# Patient Record
Sex: Male | Born: 1940 | Race: White | Hispanic: No | Marital: Married | Smoking: Former smoker
Health system: Southern US, Community
[De-identification: ages and names within clinical notes are randomized; demographics above are authoritative.]

## PROBLEM LIST (undated history)

## (undated) DIAGNOSIS — I4891 Unspecified atrial fibrillation: Secondary | ICD-10-CM

## (undated) DIAGNOSIS — F0390 Unspecified dementia without behavioral disturbance: Secondary | ICD-10-CM

## (undated) DIAGNOSIS — I5020 Unspecified systolic (congestive) heart failure: Secondary | ICD-10-CM

## (undated) HISTORY — PX: FRACTURE SURGERY: SHX138

---

## 2004-08-30 ENCOUNTER — Emergency Department: Payer: Self-pay | Admitting: Emergency Medicine

## 2006-05-05 IMAGING — CT CT HEAD WITHOUT CONTRAST
1 series · 16 of 29 positions shown, 20 images · non-contrast
Comparison: none

REASON FOR EXAM: dizziness / vertigo / [HOSPITAL]
COMMENTS:

[Series 2: without · axial · non-contrast · 0.46mm/px · z∈[-197,-67]mm · 16 of 29 slices shown, 20 images]
[im 2/29  brain]
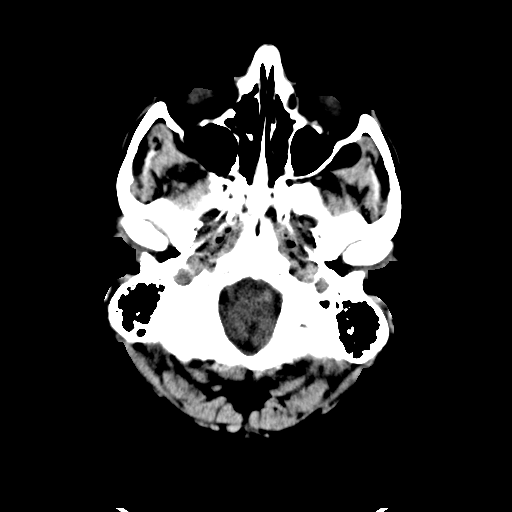
[im 2/29  bone]
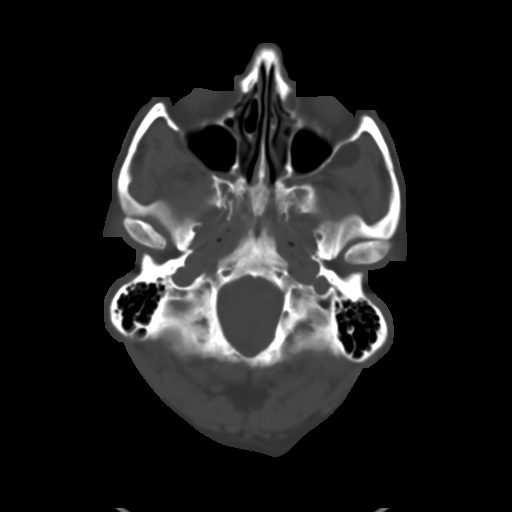
[im 4/29  brain]
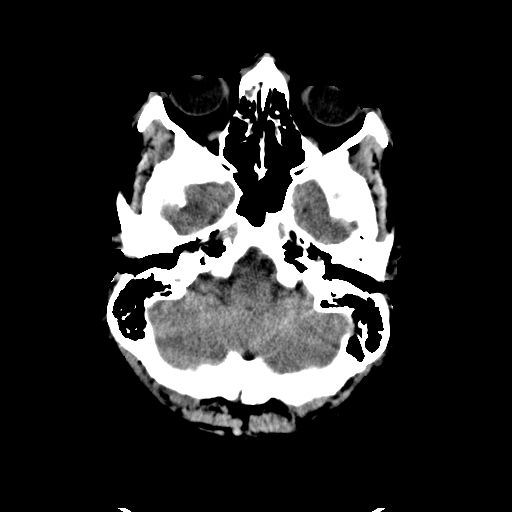
[im 6/29  brain]
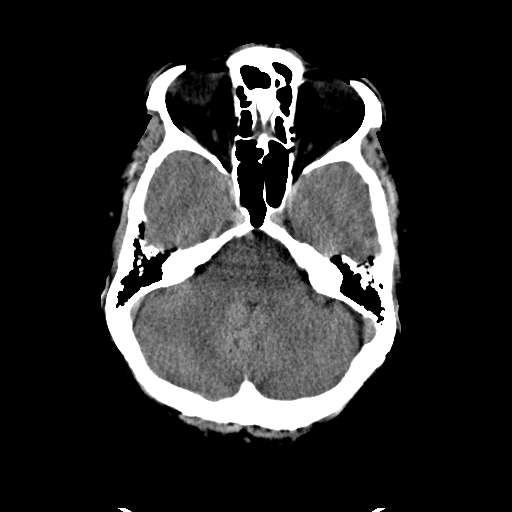
[im 7/29  brain]
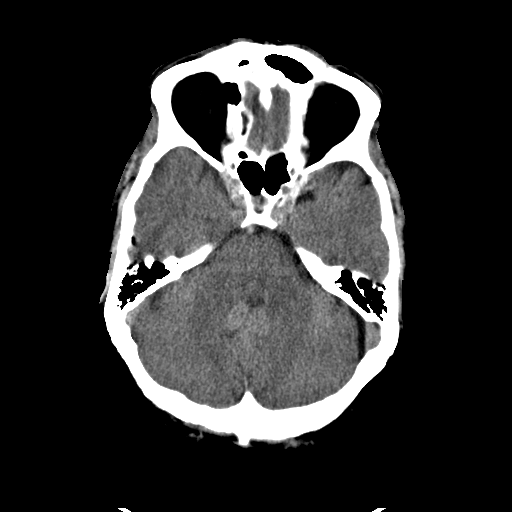
[im 9/29  brain]
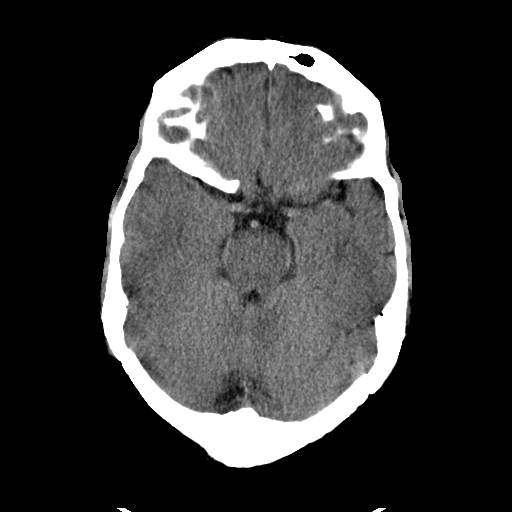
[im 9/29  bone]
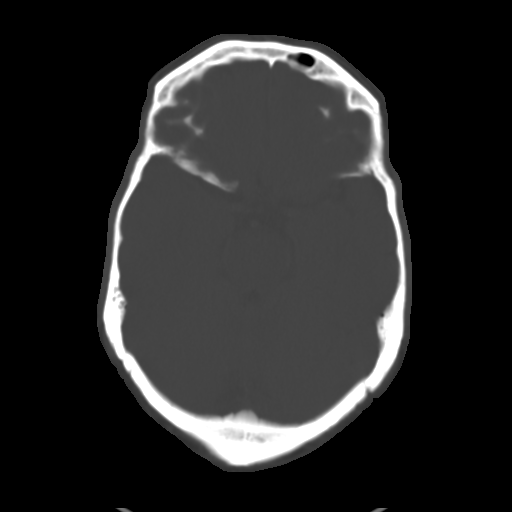
[im 11/29  brain]
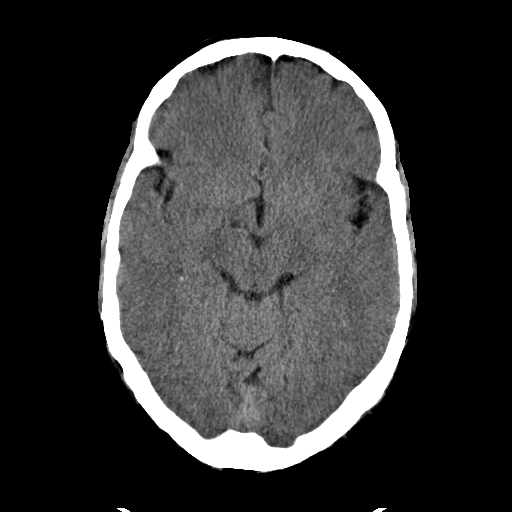
[im 12/29  brain]
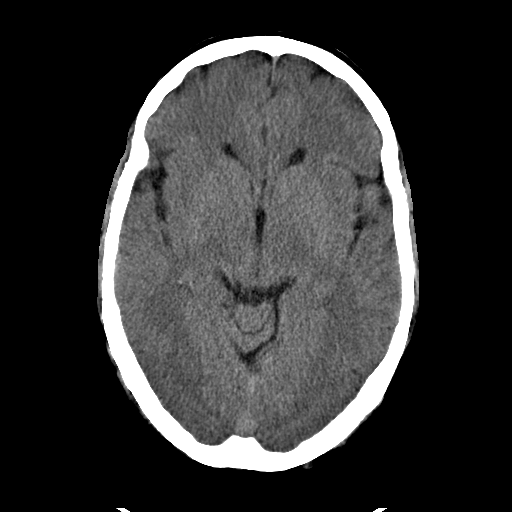
[im 14/29  brain]
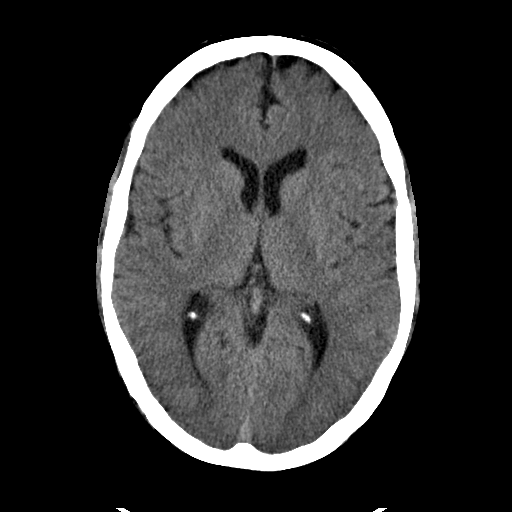
[im 16/29  brain]
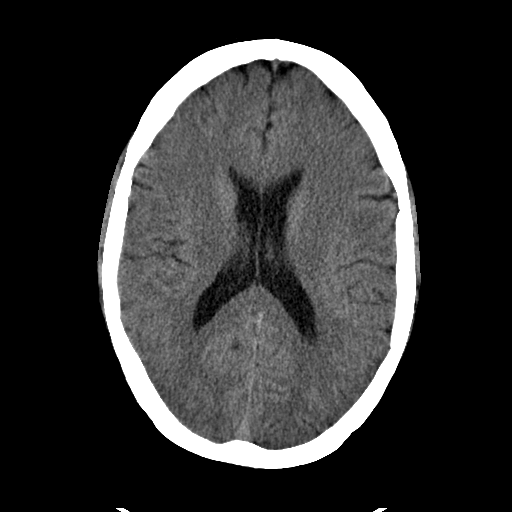
[im 16/29  bone]
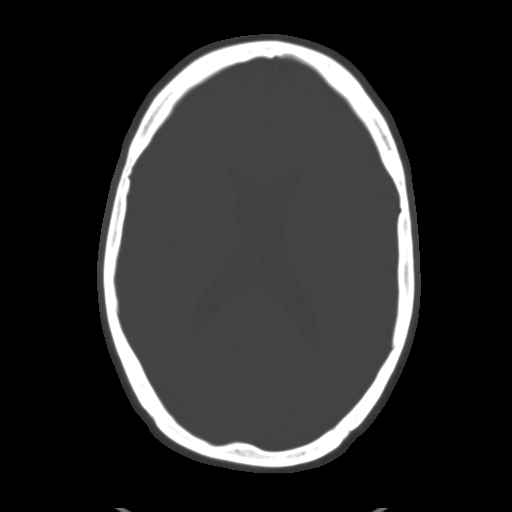
[im 18/29  brain]
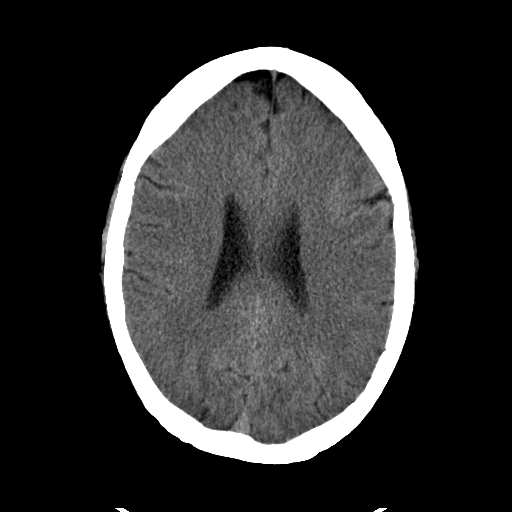
[im 19/29  brain]
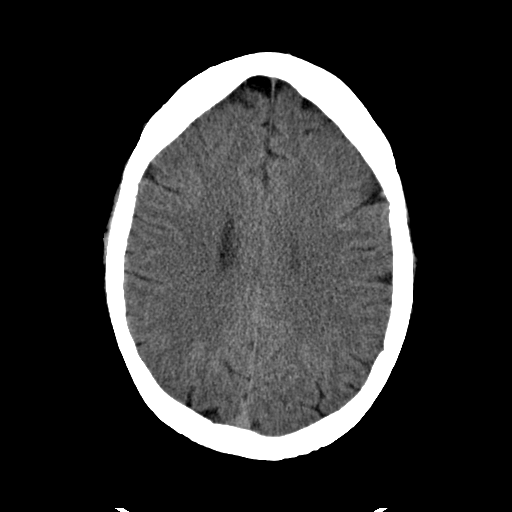
[im 21/29  brain]
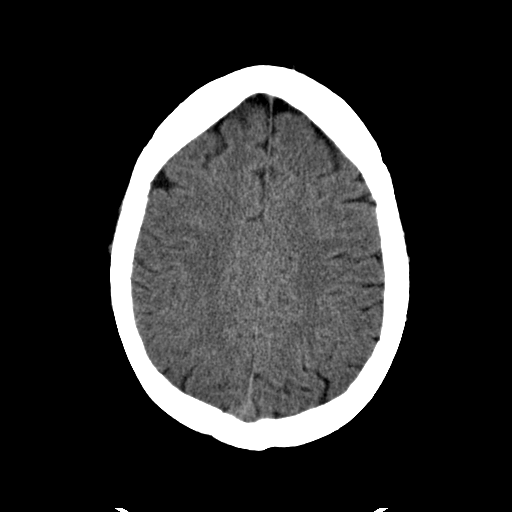
[im 23/29  brain]
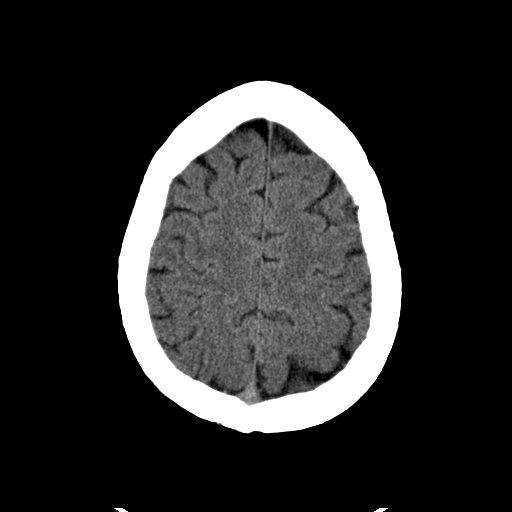
[im 23/29  bone]
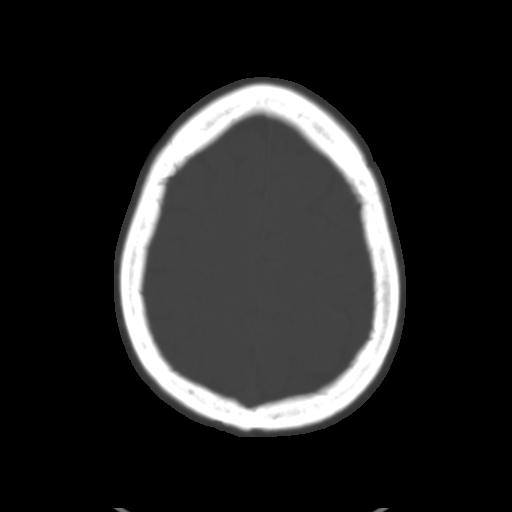
[im 24/29  brain]
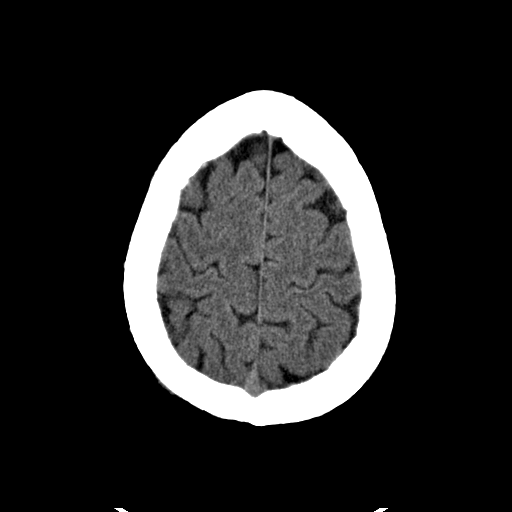
[im 26/29  brain]
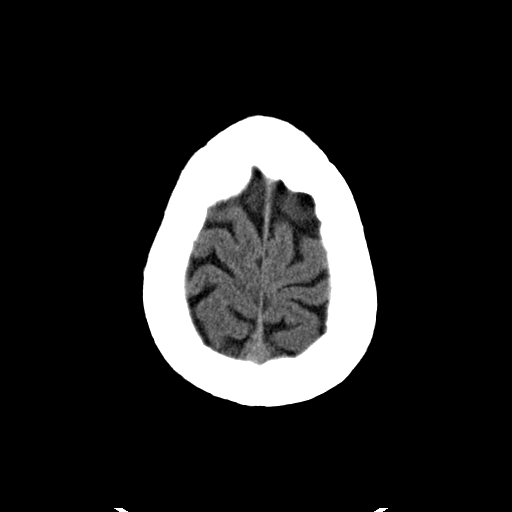
[im 28/29  brain]
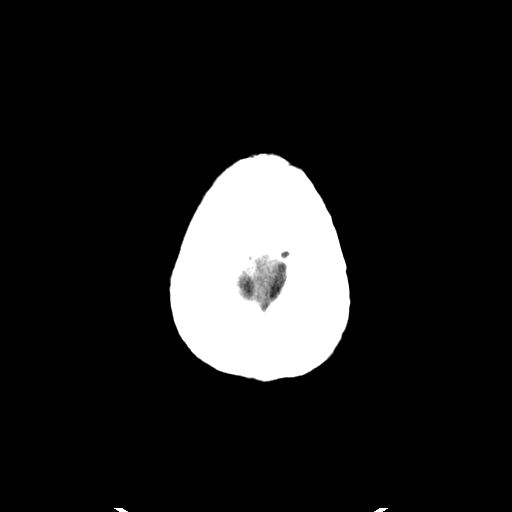

[16 of 29 positions shown; findings below may reference images not displayed]

PROCEDURE:     CT  - CT HEAD WITHOUT CONTRAST  - August 30, 2004  [DATE]

RESULT:     The patient is complaining of sudden onset of dizziness today.

The ventricles are normal in size and position.  I see no evidence of a mass
nor of mass effect.  There is no evidence of an intracranial hemorrhage.
The cerebellum and brainstem exhibit normal density.  At bone window
settings I do not see evidence of skull fracture or bony lesion and no
air/fluid levels are seen in the visualized portions of the paranasal
sinuses.
IMPRESSION: 1.     I see no acute intracranial abnormality.
2.     The findings were called to the Emergency Room at the conclusion of
the study.

## 2017-03-22 ENCOUNTER — Emergency Department
Admission: EM | Admit: 2017-03-22 | Discharge: 2017-03-22 | Disposition: A | Discharge status: Home / Self Care | Payer: Medicare Other | Attending: Emergency Medicine | Admitting: Emergency Medicine

## 2017-03-22 ENCOUNTER — Emergency Department: Payer: Medicare Other

## 2017-03-22 DIAGNOSIS — W01198A Fall on same level from slipping, tripping and stumbling with subsequent striking against other object, initial encounter: Secondary | ICD-10-CM | POA: Insufficient documentation

## 2017-03-22 DIAGNOSIS — R0789 Other chest pain: Secondary | ICD-10-CM

## 2017-03-22 DIAGNOSIS — Y999 Unspecified external cause status: Secondary | ICD-10-CM | POA: Diagnosis not present

## 2017-03-22 DIAGNOSIS — S299XXA Unspecified injury of thorax, initial encounter: Secondary | ICD-10-CM | POA: Diagnosis present

## 2017-03-22 DIAGNOSIS — Y939 Activity, unspecified: Secondary | ICD-10-CM | POA: Diagnosis not present

## 2017-03-22 DIAGNOSIS — Y929 Unspecified place or not applicable: Secondary | ICD-10-CM | POA: Insufficient documentation

## 2017-03-22 DIAGNOSIS — S20212A Contusion of left front wall of thorax, initial encounter: Secondary | ICD-10-CM | POA: Diagnosis not present

## 2017-03-22 MED ORDER — TRAMADOL HCL 50 MG PO TABS: 50.0000 mg | ORAL_TABLET | Freq: Two times a day (BID) | ORAL | 0 refills | Status: DC

## 2017-03-22 MED ORDER — TRAMADOL HCL 50 MG PO TABS
50.0000 mg | ORAL_TABLET | Freq: Once | ORAL | Status: AC
  Administered 2017-03-22: 50 mg via ORAL
  Filled 2017-03-22: qty 1

## 2017-03-22 NOTE — ED Triage Notes (Signed)
Pt states a week ago he fell and hit L rib cage. Has an abrasion noted to L ribs. Alert, oriented, ambulatory. Denies blood thinners. States pain with deep breath.

## 2017-03-22 NOTE — Discharge Instructions (Signed)
Your exam, EKG, and chest x-ray are essentially normal and negative for any acute fracture or dislocation. You have a contusion to the chest which may cause symptoms for a few days more. You should select and see a primary care provider for continued symptoms and routine medical care. Return to the ED as needed.

## 2017-03-22 NOTE — ED Provider Notes (Signed)
[facility] Emergency Department Provider Note ____________________________________________  Time seen: 1337  I have reviewed the triage vital signs and the nursing notes.  HISTORY  Chief Complaint  Fall (rib pain )  HPI Bob Ortiz is a 76 y.o. male presents to the ED for evaluation of continued left rib pain after he fell about a week prior. Patient describes falling and hitting his left rib cage on a small post sign in the ground. He presents today with abrasion and bruising noted to left ribs. He reports pain with a deep breaths that radiates around the chest wall. He denies any other injury at this time.He presents with his adult daughter, who is additionally concerned for a cardiac cause of his chest wall pain.   History reviewed. No pertinent past medical history.  There are no active problems to display for this patient.  History reviewed. No pertinent surgical history.  Prior to Admission medications   Medication Sig Start Date End Date Taking? Authorizing Provider  traMADol (ULTRAM) 50 MG tablet Take 1 tablet (50 mg total) by mouth 2 (two) times daily. 03/22/17   Keairra Bardon, Charlesetta Ivory, PA-C    Allergies Patient has no known allergies.  History reviewed. No pertinent family history.  Social History Social History  Substance Use Topics  . Smoking status: Never Smoker  . Smokeless tobacco: Not on file  . Alcohol use Yes   Review of Systems  Constitutional: Negative for fever. Cardiovascular: Negative for chest pain. Respiratory: Negative for shortness of breath. Gastrointestinal: Negative for abdominal pain, vomiting and diarrhea. Musculoskeletal: Negative for back pain. Left rib pain as above. Skin: Negative for rash. Neurological: Negative for headaches, focal weakness or numbness. ____________________________________________  PHYSICAL EXAM:  VITAL SIGNS: ED Triage Vitals [03/22/17 1230]  Enc Vitals Group     BP (!) 184/92      Pulse Rate 68     Resp 18     Temp 98.7 F (37.1 C)     Temp Source Oral     SpO2 99 %     Weight 194 lb (88 kg)     Height 5\' 9"  (1.753 m)     Head Circumference      Peak Flow      Pain Score 8     Pain Loc      Pain Edu?      Excl. in GC?     Constitutional: Alert and oriented. Well appearing and in no distress. Head: Normocephalic and atraumatic. Eyes: Conjunctivae are normal. Normal extraocular movements Cardiovascular: Normal rate, regular rhythm. Normal distal pulses. Respiratory: Normal respiratory effort. No wheezes/rales/rhonchi. Gastrointestinal: Soft and nontender. No distention. Musculoskeletal: Nontender with normal range of motion in all extremities.  Neurologic:  Normal gait without ataxia. Normal speech and language. No gross focal neurologic deficits are appreciated. Skin:  Skin is warm, dry and intact. No rash noted. ____________________________________________   RADIOLOGY  Left Rib Detail  IMPRESSION: No acute abnormality of the left ribs. Slight atelectasis at the left lung base.  I, [name], Charlesetta Ivory, personally viewed and evaluated these images (plain radiographs) as part of my medical decision making, as well as reviewing the written report by the radiologist.  <ECGINTERP>   EKG Report  EKG: there are no previous tracings available for comparison, sinus bradycardia, LBBB, 1st degree AV block. Rate 59 bpm ____________________________________________  PROCEDURES  Ultram 50 mg PO ____________________________________________  INITIAL IMPRESSION / ASSESSMENT AND PLAN / ED COURSE  Patient with  left rib pain and contusion following a mechanical fall. He is reassured by his negative chest x-ray and EKG. He is to select and follow-up with a local provider for further evaluation and management of his elevated blood pressures and benign lung findings. A prescription for Ultram is provided. Return precautions are reviewed.   ____________________________________________  FINAL CLINICAL IMPRESSION(S) / ED DIAGNOSES  Final diagnoses:  Chest wall contusion, left, initial encounter  Chest wall pain      Karmen Stabs, Charlesetta Ivory, PA-C 03/22/17 1746    Emily Filbert, MD 03/23/17 [phone]

## 2017-03-22 NOTE — ED Notes (Signed)
See triage note  States he fell last week  conts to have to left lateral rib area  Increased pain with inspiration

## 2018-08-02 ENCOUNTER — Other Ambulatory Visit: Payer: Self-pay

## 2018-08-02 ENCOUNTER — Ambulatory Visit
Admission: EM | Admit: 2018-08-02 | Discharge: 2018-08-02 | Disposition: A | Discharge status: Other Acute Inpatient Hospital | Payer: Medicare Other | Attending: Family Medicine | Admitting: Family Medicine

## 2018-08-02 ENCOUNTER — Encounter: Payer: Self-pay | Admitting: Emergency Medicine

## 2018-08-02 DIAGNOSIS — R42 Dizziness and giddiness: Secondary | ICD-10-CM

## 2018-08-02 DIAGNOSIS — R9431 Abnormal electrocardiogram [ECG] [EKG]: Secondary | ICD-10-CM

## 2018-08-02 DIAGNOSIS — R002 Palpitations: Secondary | ICD-10-CM | POA: Diagnosis present

## 2018-08-02 LAB — GLUCOSE, CAPILLARY: Glucose-Capillary: 101 mg/dL — ABNORMAL HIGH (ref 70–99)

## 2018-08-02 NOTE — ED Triage Notes (Signed)
Patient also states that he felt his pulse and it felt like it was skipping beats.

## 2018-08-02 NOTE — ED Triage Notes (Signed)
Patient denies nausea or chest pain. He states his pain is mostly in his shoulders. Patient denies any cardiac history.

## 2018-08-02 NOTE — Discharge Instructions (Signed)
Recommend patient go to the Emergency Department for further evaluation and management °

## 2018-08-02 NOTE — ED Triage Notes (Signed)
Patient in today c/o 3-4 day history of body aches and dizziness that started yesterday. Patient denies cough, congestion or fever. Patient has tried OTC Advil.

## 2018-08-06 DIAGNOSIS — E038 Other specified hypothyroidism: Secondary | ICD-10-CM | POA: Insufficient documentation

## 2018-11-25 IMAGING — CR DG RIBS W/ CHEST 3+V*L*
1 series · 4 of 4 positions shown · non-contrast
Comparison: None.

CLINICAL DATA: Left anterior rib pain since a fall last week.

EXAM:
LEFT RIBS AND CHEST - 3+ VIEW

[Series 1: dg ribs unilateral w/chest left · 0.14mm/px · 4 of 4 slices shown]
[im 1/4]
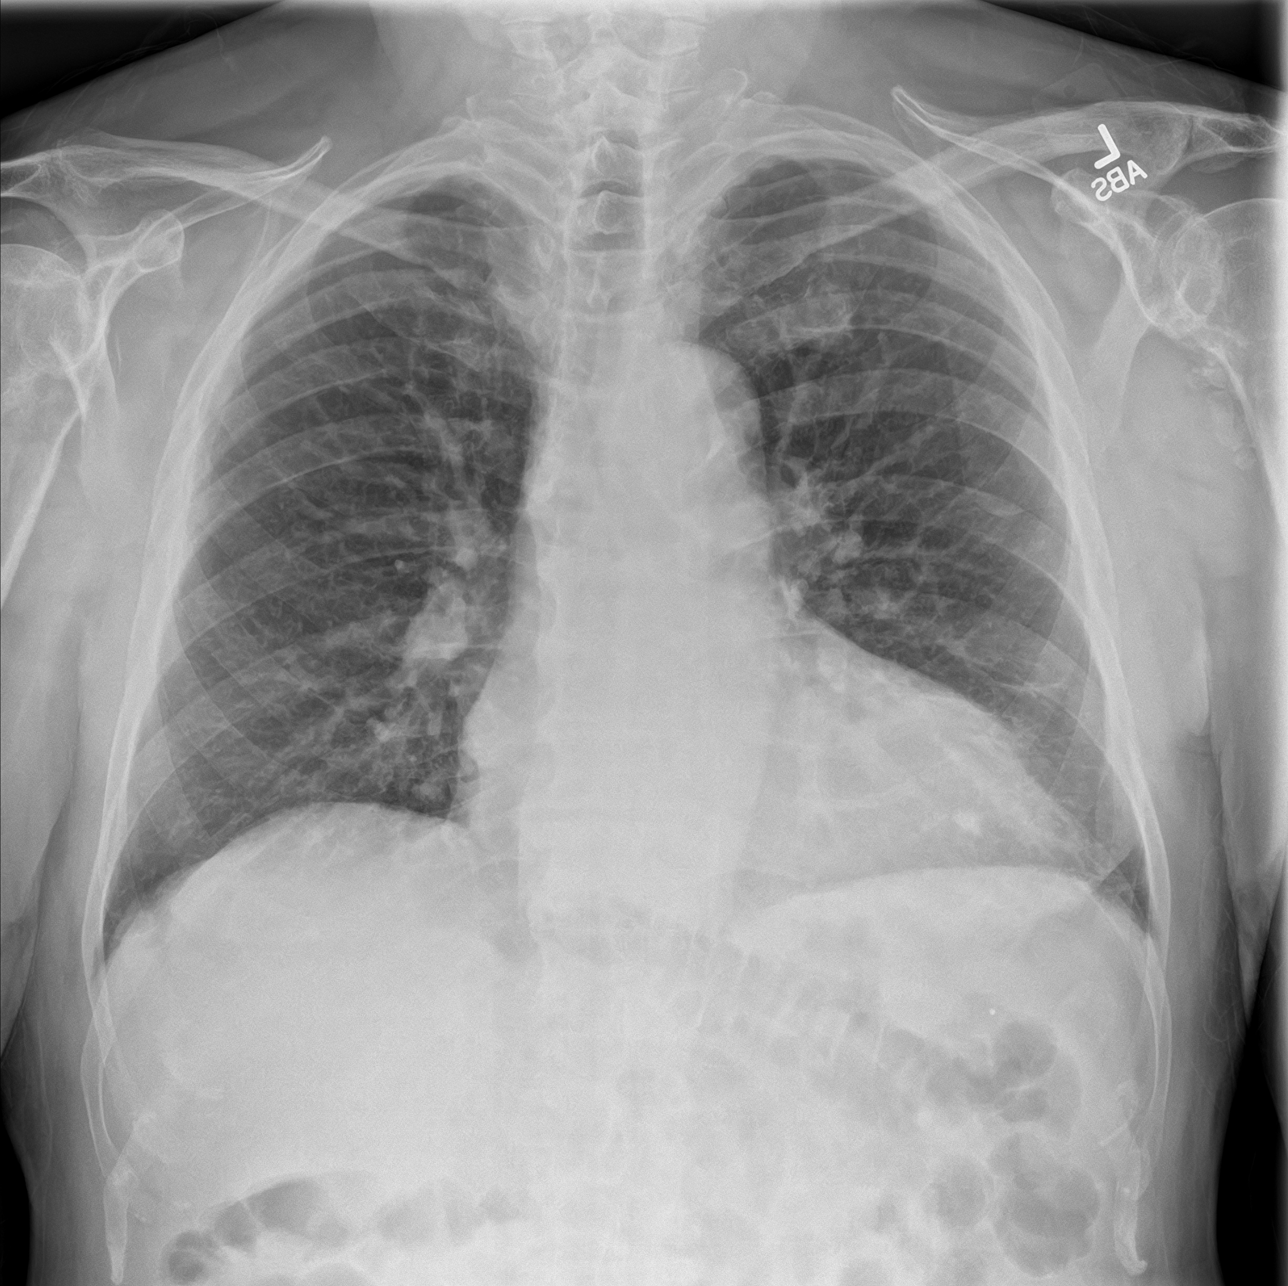
[im 2/4]
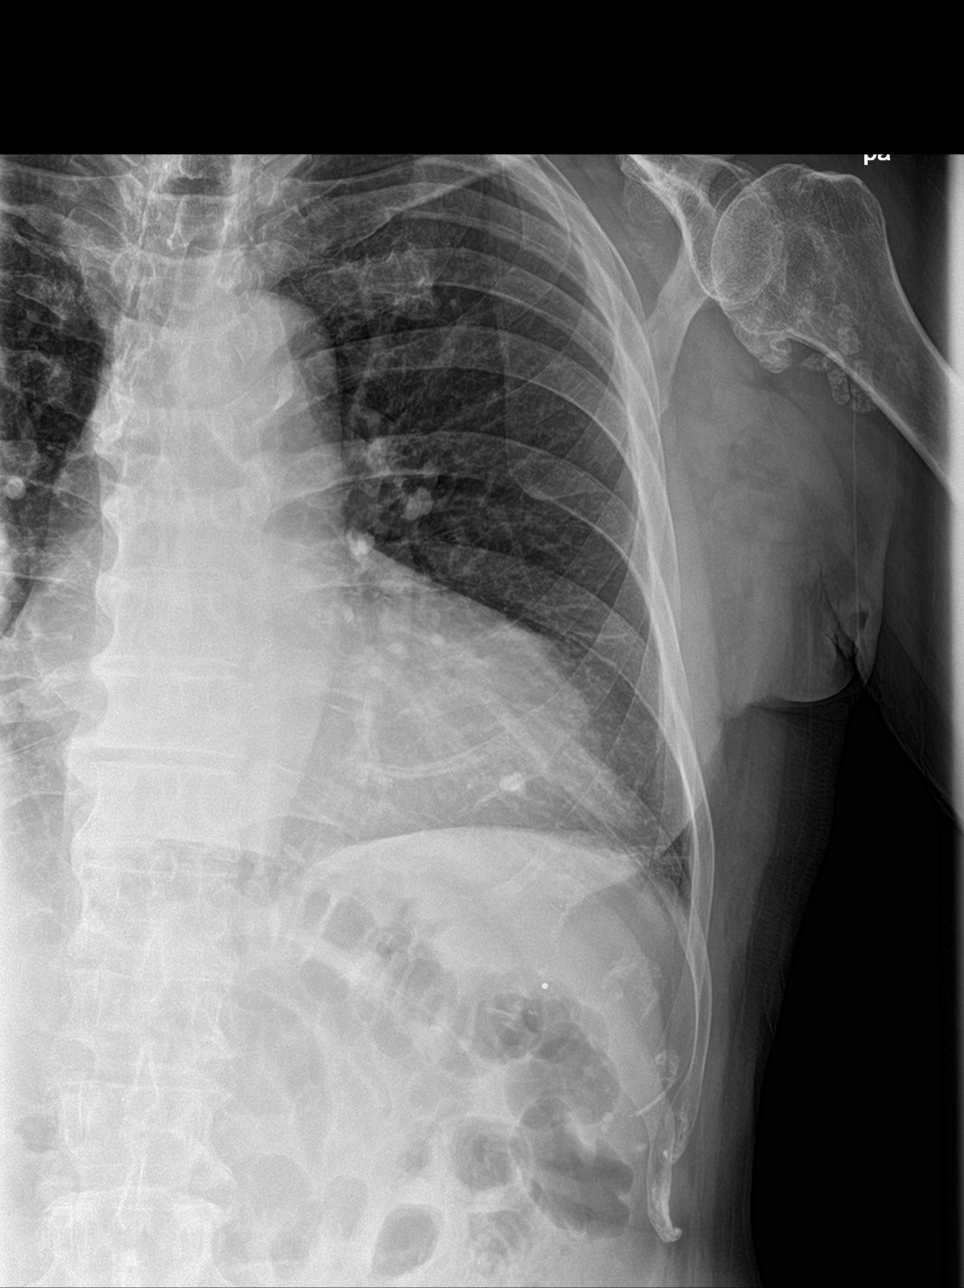
[im 3/4]
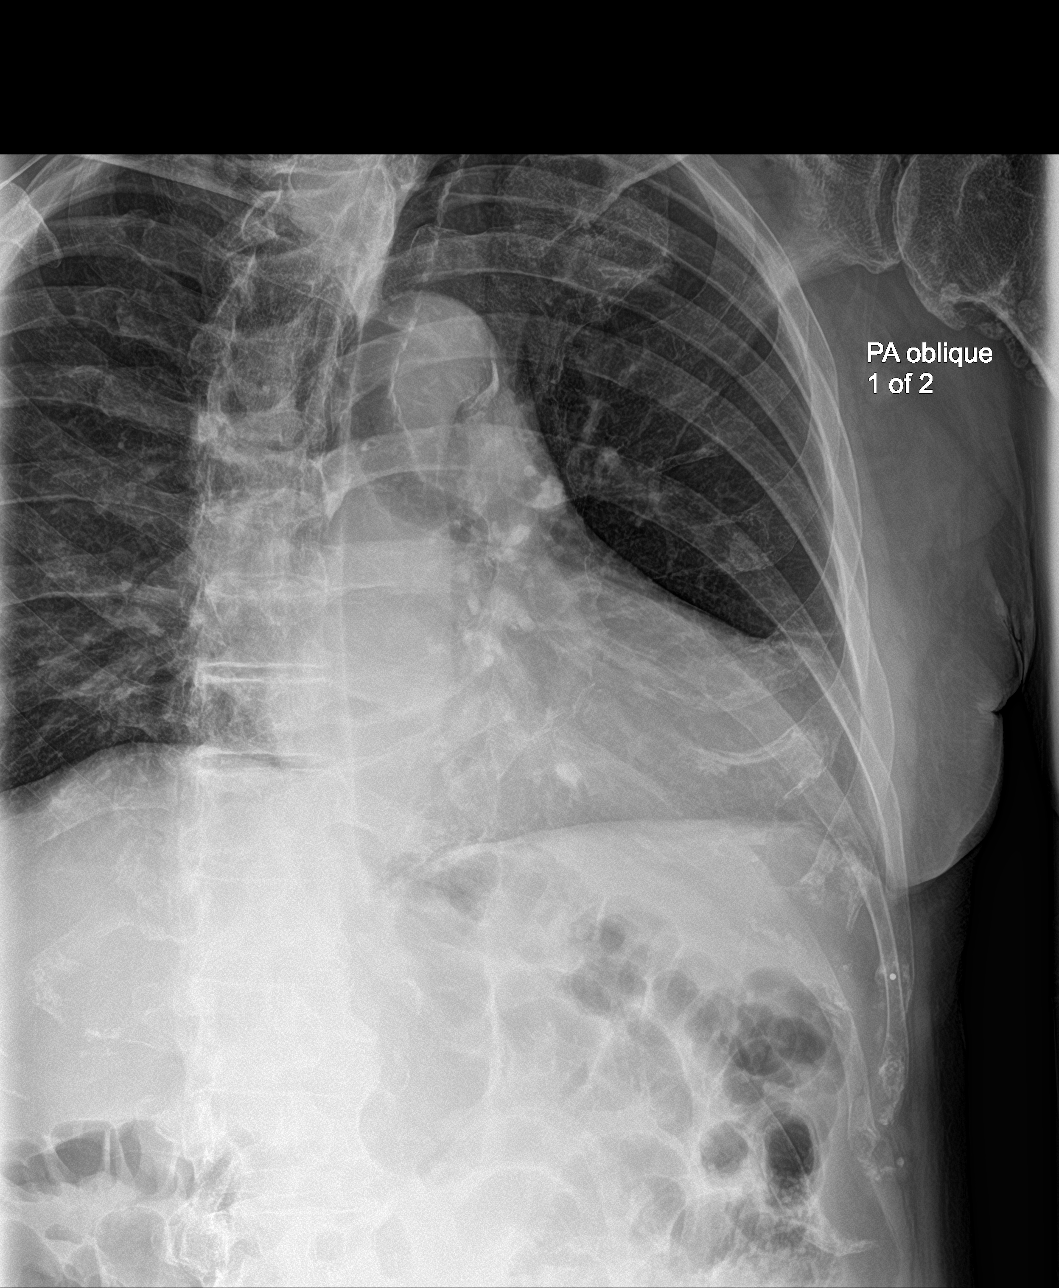
[im 4/4]
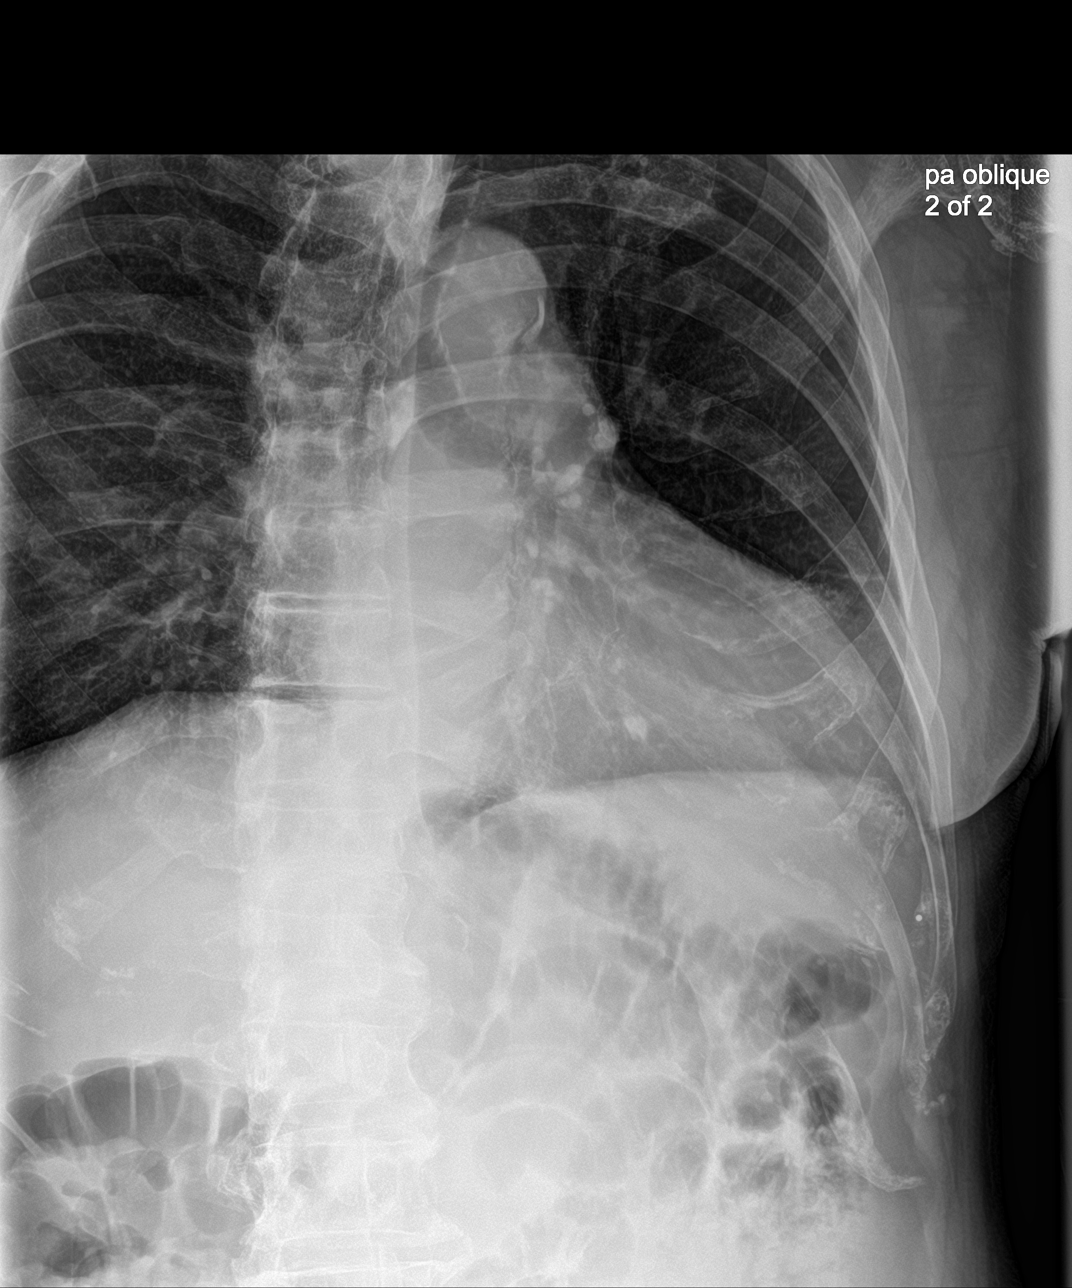

[4 of 4 positions shown; findings below may reference images not displayed]

FINDINGS: No fracture or other bone lesions are seen involving the ribs. There
is no evidence of pneumothorax or pleural effusion. Both lungs are
clear except for minimal atelectasis at the left lung base laterally
and a calcified granuloma at the left lung base. Heart size and
mediastinal contours are within normal limits.

Multiple calcified loose bodies in the left glenohumeral joint.
IMPRESSION: No acute abnormality of the left ribs. Slight atelectasis at the
left lung base.

## 2018-12-03 NOTE — ED Provider Notes (Signed)
[facility]    CSN: [id number] Arrival date & time: 08/02/18  1624     History   Chief Complaint Chief Complaint  Patient presents with  . Dizziness  . Shoulder Pain    HPI Bob Ortiz is a 78 y.o. male.   78 yo male with a c/o dizziness and palpitations for the past 2-3 days. Has also had bodyaches. Denies chest pain, shortness of breath, fevers.   The history is provided by the patient.  Dizziness  Shoulder Pain    History reviewed. No pertinent past medical history.  There are no active problems to display for this patient.   Past Surgical History:  Procedure Laterality Date  . FRACTURE SURGERY Right    shoulder       Home Medications    Prior to Admission medications   Medication Sig Start Date End Date Taking? Authorizing Provider  traMADol (ULTRAM) 50 MG tablet Take 1 tablet (50 mg total) by mouth 2 (two) times daily. 03/22/17   Menshew, Charlesetta Ivory, PA-C    Family History Family History  Problem Relation Age of Onset  . Stomach cancer Mother   . Heart attack Father 57  . Stroke Father     Social History Social History   Tobacco Use  . Smoking status: Former Smoker    Last attempt to quit: 1975    Years since quitting: 45.3  . Smokeless tobacco: Never Used  Substance Use Topics  . Alcohol use: Yes    Comment: occassionally  . Drug use: Never     Allergies   Patient has no known allergies.   Review of Systems Review of Systems  Neurological: Positive for dizziness.     Physical Exam Triage Vital Signs ED Triage Vitals  Enc Vitals Group     BP 08/02/18 1639 (!) 148/96     Pulse Rate 08/02/18 1639 68     Resp 08/02/18 1639 18     Temp 08/02/18 1639 97.9 F (36.6 C)     Temp Source 08/02/18 1639 Oral     SpO2 08/02/18 1639 99 %     Weight 08/02/18 1642 190 lb (86.2 kg)     Height 08/02/18 1642 5\' 9"  (1.753 m)     Head Circumference --      Peak Flow --      Pain Score 08/02/18 1642 3     Pain Loc --      Pain Edu? --      Excl. in GC? --    No data found.  Updated Vital Signs BP (!) 148/96 (BP Location: Left Arm)   Pulse 68   Temp 97.9 F (36.6 C) (Oral)   Resp 18   Ht 5\' 9"  (1.753 m)   Wt 86.2 kg   SpO2 99%   BMI 28.06 kg/m   Visual Acuity Right Eye Distance:   Left Eye Distance:   Bilateral Distance:    Right Eye Near:   Left Eye Near:    Bilateral Near:     Physical Exam Vitals signs and nursing note reviewed.  Constitutional:      General: He is not in acute distress.    Appearance: He is not toxic-appearing or diaphoretic.  Cardiovascular:     Rate and Rhythm: Normal rate and regular rhythm.     Pulses: Normal pulses.     Heart sounds: Normal heart sounds.  Pulmonary:     Effort: Pulmonary effort is normal. No respiratory  distress.     Breath sounds: Normal breath sounds. No stridor. No wheezing, rhonchi or rales.  Neurological:     Mental Status: He is alert.      UC Treatments / Results  Labs (all labs ordered are listed, but only abnormal results are displayed) Labs Reviewed  GLUCOSE, CAPILLARY - Abnormal; Notable for the following components:      Result Value   Glucose-Capillary 101 (*)    All other components within normal limits  CBG MONITORING, ED    EKG None  Radiology No results found.  Procedures Procedures (including critical care time)  Medications Ordered in UC Medications - No data to display  Initial Impression / Assessment and Plan / UC Course  I have reviewed the triage vital signs and the nursing notes.  Pertinent labs & imaging results that were available during my care of the patient were reviewed by me and considered in my medical decision making (see chart for details).      Final Clinical Impressions(s) / UC Diagnoses   Final diagnoses:  Palpitations  Dizziness  EKG abnormalities     Discharge Instructions     Recommend patient go to the Emergency Department for further evaluation and management     ED Prescriptions    None      1. ekg results and diagnosis reviewed with patient; due to patient's symptoms and abnormal ekg, recommend patient go to ED for further evaluation and management  Controlled Substance Prescriptions [place] Controlled Substance Registry consulted? Not Applicable   Payton Mccallumonty, Jullisa Grigoryan, MD 12/03/18 1850

## 2023-07-08 ENCOUNTER — Other Ambulatory Visit
Admission: RE | Admit: 2023-07-08 | Discharge: 2023-07-08 | Disposition: A | Discharge status: Home / Self Care | Payer: Medicare Other | Source: Ambulatory Visit | Attending: Internal Medicine | Admitting: Internal Medicine

## 2023-07-08 DIAGNOSIS — R600 Localized edema: Secondary | ICD-10-CM | POA: Diagnosis present

## 2023-07-08 LAB — BRAIN NATRIURETIC PEPTIDE: B Natriuretic Peptide: 1191 pg/mL — ABNORMAL HIGH (ref 0.0–100.0)

## 2023-09-01 ENCOUNTER — Other Ambulatory Visit: Payer: Self-pay | Admitting: Cardiology

## 2023-09-01 ENCOUNTER — Ambulatory Visit
Admission: RE | Admit: 2023-09-01 | Discharge: 2023-09-01 | Disposition: A | Discharge status: Home / Self Care | Payer: Medicare Other | Source: Ambulatory Visit | Attending: Cardiology | Admitting: Cardiology

## 2023-09-01 DIAGNOSIS — R443 Hallucinations, unspecified: Secondary | ICD-10-CM | POA: Insufficient documentation

## 2023-09-01 DIAGNOSIS — I5020 Unspecified systolic (congestive) heart failure: Secondary | ICD-10-CM | POA: Insufficient documentation

## 2023-09-21 ENCOUNTER — Ambulatory Visit
Admission: EM | Admit: 2023-09-21 | Discharge: 2023-09-21 | Disposition: A | Discharge status: Home / Self Care | Payer: Medicare Other | Attending: Emergency Medicine | Admitting: Emergency Medicine

## 2023-09-21 DIAGNOSIS — R443 Hallucinations, unspecified: Secondary | ICD-10-CM

## 2023-09-21 DIAGNOSIS — R4182 Altered mental status, unspecified: Secondary | ICD-10-CM

## 2023-09-21 NOTE — ED Provider Notes (Addendum)
 HPI  SUBJECTIVE:  Bob Ortiz is a 83 y.o. male who presents with recurrent hallucinations/ altered mental status starting last night.  States that he seems more confused than usual but is having frank hallucinations.  She states that he appears to be more unsteady on his feet than usual.  No fevers, nausea, vomiting, diarrhea, abdominal pain, urinary complaints, recent fall, head trauma, headache, chest pain, coughing, wheezing, shortness of breath, speech changes, facial droop, focal arm or leg weakness.  No antipyretic in the past 6 hours.  The only recent medication changes he has had is that he was switched to Bumex and had his losartan decreased during his recent hospital stay.  Recently admitted to [facility] hospital with severe hallucinations on 09/01/2023.  Sodium was 121 at the time.  His baseline is 129-134.  Patient has a past medical history of CHF, atrial fibrillation, pituitary mass/hypothyroidism, hyponatremia, Addison's disease, atrial fibrillation on Eliquis.  No history of UTI, pyelonephritis.  PCP: None.  History reviewed. No pertinent past medical history.  Past Surgical History:  Procedure Laterality Date   FRACTURE SURGERY Right    shoulder    Family History  Problem Relation Age of Onset   Stomach cancer Mother    Heart attack Father 28   Stroke Father     Social History   Tobacco Use   Smoking status: Former    Current packs/day: 0.00    Types: Cigarettes    Quit date: 1975    Years since quitting: 50.1   Smokeless tobacco: Never  Vaping Use   Vaping status: Never Used  Substance Use Topics   Alcohol use: Yes    Comment: occassionally   Drug use: Never    No current facility-administered medications for this encounter.  Current Outpatient Medications:    bumetanide (BUMEX) 1 MG tablet, Take by mouth., Disp: , Rfl:    spironolactone (ALDACTONE) 25 MG tablet, Take by mouth., Disp: , Rfl:    traMADol (ULTRAM) 50 MG tablet, Take 1 tablet (50 mg total)  by mouth 2 (two) times daily., Disp: 10 tablet, Rfl: 0  No Known Allergies   ROS  As noted in HPI.   Physical Exam  BP 116/78 (BP Location: Left Arm)   Pulse 73   Temp (!) 97.5 F (36.4 C)   SpO2 99%   Constitutional: Well developed, well nourished, no acute distress Eyes: PERRL, EOMI, conjunctiva normal bilaterally HENT: Normocephalic, atraumatic,mucus membranes moist Respiratory: Crackles at the bases, more on the left. Cardiovascular: Normal rate and rhythm, no murmurs, no gallops, no rubs GI: Nondistended skin: No rash, skin intact Musculoskeletal: Bilateral lower extremity, no tenderness, no deformities Neurologic: Oriented to self but not time or place, CN III-XII grossly intact, no motor deficits, sensation grossly intact Psychiatric: Speech and behavior appropriate   ED Course   Medications - No data to display  No orders of the defined types were placed in this encounter.  No results found for this or any previous visit (from the past 24 hours). No results found.  ED Clinical Impression  1. Hallucinations   2. Altered mental status, unspecified altered mental status type      ED Assessment/Plan     Patient presents with recurrent hallucinations/altered mental status starting last night.  Daughter states that he seems also unsteady on his feet, although his coordination is grossly normal here.  I feel that he warrants a more comprehensive workup than what can be done here at the urgent care to  evaluate for emergent causes of altered mental status/hallucinations.  Differential is vast and includes includes infection, electrolyte imbalance, acute renal failure to name a few.  I do not think that he has an acute intra-abdominal process, there is no evidence of hypoxia, or stroke.  Discussed with daughter that we could do a partial workup here, but if we find anything wrong, we would be transferring him to the hospital for further care.  I believe that he is  stable to go the ED via private vehicle.  Discussed rationale for transfer to the emergency department with daughter.  She agrees to go.  She has opted to go to the [facility] emergency department.  No orders of the defined types were placed in this encounter.     *This clinic note was created using Dragon dictation software. Therefore, there may be occasional mistakes despite careful proofreading. ?    Domenick Gong, MD 09/21/23 1610    Domenick Gong, MD 09/21/23 [phone]

## 2023-09-21 NOTE — Discharge Instructions (Signed)
 I am concerned because your father's had a recurrent acute change in mental status/hallucinations.  This could be caused by a number of things, including a low sodium, pulmonary or urinary tract infection, acute renal failure, to name a few.  I believe that he needs a higher level of care to fully rule out emergent causes of his symptoms.

## 2023-09-21 NOTE — ED Triage Notes (Signed)
 Pt was recently hospitalized with hallucinations  Pt is here to rule out a UTI.

## 2023-09-21 NOTE — ED Notes (Signed)
 Patient is being discharged from the Urgent Care and sent to the Emergency Department via POV . Per Chaney Malling, MD, patient is in need of higher level of care due to altered mental changes. Patient is aware and verbalizes understanding of plan of care.  Vitals:   09/21/23 1149  BP: 116/78  Pulse: 73  Temp: (!) 97.5 F (36.4 C)  SpO2: 99%

## 2023-11-02 ENCOUNTER — Ambulatory Visit: Payer: Medicare Other | Admitting: Family Medicine

## 2023-12-08 DIAGNOSIS — Z950 Presence of cardiac pacemaker: Secondary | ICD-10-CM | POA: Insufficient documentation

## 2023-12-13 ENCOUNTER — Emergency Department

## 2023-12-13 ENCOUNTER — Inpatient Hospital Stay
Admission: EM | Admit: 2023-12-13 | Discharge: 2023-12-21 | DRG: 100 | Disposition: A | Discharge status: Skilled Nursing Facility | Attending: Internal Medicine | Admitting: Internal Medicine

## 2023-12-13 ENCOUNTER — Encounter: Payer: Self-pay | Admitting: Internal Medicine

## 2023-12-13 ENCOUNTER — Other Ambulatory Visit: Payer: Self-pay

## 2023-12-13 DIAGNOSIS — I110 Hypertensive heart disease with heart failure: Secondary | ICD-10-CM | POA: Diagnosis present

## 2023-12-13 DIAGNOSIS — I482 Chronic atrial fibrillation, unspecified: Secondary | ICD-10-CM | POA: Diagnosis not present

## 2023-12-13 DIAGNOSIS — E876 Hypokalemia: Secondary | ICD-10-CM | POA: Diagnosis present

## 2023-12-13 DIAGNOSIS — Z8249 Family history of ischemic heart disease and other diseases of the circulatory system: Secondary | ICD-10-CM

## 2023-12-13 DIAGNOSIS — Z888 Allergy status to other drugs, medicaments and biological substances status: Secondary | ICD-10-CM

## 2023-12-13 DIAGNOSIS — R54 Age-related physical debility: Secondary | ICD-10-CM | POA: Diagnosis present

## 2023-12-13 DIAGNOSIS — I469 Cardiac arrest, cause unspecified: Secondary | ICD-10-CM | POA: Diagnosis not present

## 2023-12-13 DIAGNOSIS — E16A1 Hypoglycemia level 1: Secondary | ICD-10-CM | POA: Diagnosis not present

## 2023-12-13 DIAGNOSIS — Z87891 Personal history of nicotine dependence: Secondary | ICD-10-CM | POA: Diagnosis not present

## 2023-12-13 DIAGNOSIS — Z7901 Long term (current) use of anticoagulants: Secondary | ICD-10-CM

## 2023-12-13 DIAGNOSIS — I10 Essential (primary) hypertension: Secondary | ICD-10-CM | POA: Insufficient documentation

## 2023-12-13 DIAGNOSIS — Z800 Family history of malignant neoplasm of digestive organs: Secondary | ICD-10-CM

## 2023-12-13 DIAGNOSIS — J90 Pleural effusion, not elsewhere classified: Secondary | ICD-10-CM | POA: Insufficient documentation

## 2023-12-13 DIAGNOSIS — E871 Hypo-osmolality and hyponatremia: Secondary | ICD-10-CM | POA: Diagnosis present

## 2023-12-13 DIAGNOSIS — T502X5A Adverse effect of carbonic-anhydrase inhibitors, benzothiadiazides and other diuretics, initial encounter: Secondary | ICD-10-CM | POA: Diagnosis present

## 2023-12-13 DIAGNOSIS — Z7189 Other specified counseling: Secondary | ICD-10-CM | POA: Diagnosis not present

## 2023-12-13 DIAGNOSIS — G9341 Metabolic encephalopathy: Secondary | ICD-10-CM | POA: Diagnosis present

## 2023-12-13 DIAGNOSIS — R7989 Other specified abnormal findings of blood chemistry: Secondary | ICD-10-CM

## 2023-12-13 DIAGNOSIS — R251 Tremor, unspecified: Secondary | ICD-10-CM

## 2023-12-13 DIAGNOSIS — I4819 Other persistent atrial fibrillation: Secondary | ICD-10-CM | POA: Diagnosis present

## 2023-12-13 DIAGNOSIS — I2489 Other forms of acute ischemic heart disease: Secondary | ICD-10-CM | POA: Diagnosis present

## 2023-12-13 DIAGNOSIS — I447 Left bundle-branch block, unspecified: Secondary | ICD-10-CM | POA: Diagnosis present

## 2023-12-13 DIAGNOSIS — H9190 Unspecified hearing loss, unspecified ear: Secondary | ICD-10-CM | POA: Diagnosis present

## 2023-12-13 DIAGNOSIS — E11649 Type 2 diabetes mellitus with hypoglycemia without coma: Secondary | ICD-10-CM | POA: Diagnosis not present

## 2023-12-13 DIAGNOSIS — I2510 Atherosclerotic heart disease of native coronary artery without angina pectoris: Secondary | ICD-10-CM | POA: Diagnosis present

## 2023-12-13 DIAGNOSIS — E271 Primary adrenocortical insufficiency: Secondary | ICD-10-CM | POA: Diagnosis present

## 2023-12-13 DIAGNOSIS — F0282 Dementia in other diseases classified elsewhere, unspecified severity, with psychotic disturbance: Secondary | ICD-10-CM | POA: Diagnosis present

## 2023-12-13 DIAGNOSIS — Z823 Family history of stroke: Secondary | ICD-10-CM

## 2023-12-13 DIAGNOSIS — R569 Unspecified convulsions: Secondary | ICD-10-CM | POA: Diagnosis not present

## 2023-12-13 DIAGNOSIS — Z515 Encounter for palliative care: Secondary | ICD-10-CM | POA: Diagnosis not present

## 2023-12-13 DIAGNOSIS — Z79899 Other long term (current) drug therapy: Secondary | ICD-10-CM

## 2023-12-13 DIAGNOSIS — G3183 Dementia with Lewy bodies: Secondary | ICD-10-CM | POA: Diagnosis present

## 2023-12-13 DIAGNOSIS — R7401 Elevation of levels of liver transaminase levels: Secondary | ICD-10-CM | POA: Insufficient documentation

## 2023-12-13 DIAGNOSIS — Z7984 Long term (current) use of oral hypoglycemic drugs: Secondary | ICD-10-CM

## 2023-12-13 DIAGNOSIS — I428 Other cardiomyopathies: Secondary | ICD-10-CM | POA: Diagnosis present

## 2023-12-13 DIAGNOSIS — I5023 Acute on chronic systolic (congestive) heart failure: Secondary | ICD-10-CM | POA: Diagnosis present

## 2023-12-13 DIAGNOSIS — R0902 Hypoxemia: Secondary | ICD-10-CM | POA: Diagnosis not present

## 2023-12-13 DIAGNOSIS — I509 Heart failure, unspecified: Principal | ICD-10-CM

## 2023-12-13 DIAGNOSIS — Z950 Presence of cardiac pacemaker: Secondary | ICD-10-CM

## 2023-12-13 LAB — CBC WITH DIFFERENTIAL/PLATELET
Abs Immature Granulocytes: 0.28 10*3/uL — ABNORMAL HIGH (ref 0.00–0.07)
Basophils Absolute: 0.1 10*3/uL (ref 0.0–0.1)
Basophils Relative: 1 %
Eosinophils Absolute: 0.2 10*3/uL (ref 0.0–0.5)
Eosinophils Relative: 2 %
HCT: 42.6 % (ref 39.0–52.0)
Hemoglobin: 13.6 g/dL (ref 13.0–17.0)
Immature Granulocytes: 3 %
Lymphocytes Relative: 12 %
Lymphs Abs: 1.1 10*3/uL (ref 0.7–4.0)
MCH: 31.9 pg (ref 26.0–34.0)
MCHC: 31.9 g/dL (ref 30.0–36.0)
MCV: 100 fL (ref 80.0–100.0)
Monocytes Absolute: 0.8 10*3/uL (ref 0.1–1.0)
Monocytes Relative: 9 %
Neutro Abs: 6.8 10*3/uL (ref 1.7–7.7)
Neutrophils Relative %: 73 %
Platelets: 207 10*3/uL (ref 150–400)
RBC: 4.26 MIL/uL (ref 4.22–5.81)
RDW: 14.4 % (ref 11.5–15.5)
WBC: 9.2 10*3/uL (ref 4.0–10.5)
nRBC: 0 % (ref 0.0–0.2)

## 2023-12-13 LAB — URINALYSIS, W/ REFLEX TO CULTURE (INFECTION SUSPECTED)
Bilirubin Urine: NEGATIVE
Glucose, UA: 150 mg/dL — AB
Hgb urine dipstick: NEGATIVE
Ketones, ur: NEGATIVE mg/dL
Leukocytes,Ua: NEGATIVE
Nitrite: NEGATIVE
Protein, ur: 100 mg/dL — AB
Specific Gravity, Urine: 1.023 (ref 1.005–1.030)
pH: 6 (ref 5.0–8.0)

## 2023-12-13 LAB — COMPREHENSIVE METABOLIC PANEL WITH GFR
ALT: 61 U/L — ABNORMAL HIGH (ref 0–44)
AST: 105 U/L — ABNORMAL HIGH (ref 15–41)
Albumin: 3.1 g/dL — ABNORMAL LOW (ref 3.5–5.0)
Alkaline Phosphatase: 131 U/L — ABNORMAL HIGH (ref 38–126)
Anion gap: 11 (ref 5–15)
BUN: 13 mg/dL (ref 8–23)
CO2: 23 mmol/L (ref 22–32)
Calcium: 8.4 mg/dL — ABNORMAL LOW (ref 8.9–10.3)
Chloride: 96 mmol/L — ABNORMAL LOW (ref 98–111)
Creatinine, Ser: 0.92 mg/dL (ref 0.61–1.24)
GFR, Estimated: >60 mL/min (ref >60)
Glucose, Bld: 100 mg/dL — ABNORMAL HIGH (ref 70–99)
Potassium: 3.6 mmol/L (ref 3.5–5.1)
Sodium: 130 mmol/L — ABNORMAL LOW (ref 135–145)
Total Bilirubin: 0.9 mg/dL (ref 0.0–1.2)
Total Protein: 6.4 g/dL — ABNORMAL LOW (ref 6.5–8.1)

## 2023-12-13 LAB — PHOSPHORUS: Phosphorus: 3.9 mg/dL (ref 2.5–4.6)

## 2023-12-13 LAB — BLOOD GAS, VENOUS
Acid-Base Excess: 6 mmol/L — ABNORMAL HIGH (ref 0.0–2.0)
Bicarbonate: 30.6 mmol/L — ABNORMAL HIGH (ref 20.0–28.0)
O2 Saturation: 80.7 %
Patient temperature: 37
pCO2, Ven: 43 mmHg — ABNORMAL LOW (ref 44–60)
pH, Ven: 7.46 — ABNORMAL HIGH (ref 7.25–7.43)
pO2, Ven: 48 mmHg — ABNORMAL HIGH (ref 32–45)

## 2023-12-13 LAB — PROTIME-INR
INR: 1.4 — ABNORMAL HIGH (ref 0.8–1.2)
Prothrombin Time: 17 s — ABNORMAL HIGH (ref 11.4–15.2)

## 2023-12-13 LAB — TROPONIN I (HIGH SENSITIVITY)
Troponin I (High Sensitivity): 322 ng/L (ref <18)
Troponin I (High Sensitivity): 405 ng/L (ref <18)

## 2023-12-13 LAB — LACTIC ACID, PLASMA
Lactic Acid, Venous: 1.7 mmol/L (ref 0.5–1.9)
Lactic Acid, Venous: 1 mmol/L (ref 0.5–1.9)

## 2023-12-13 LAB — BRAIN NATRIURETIC PEPTIDE: B Natriuretic Peptide: 780.7 pg/mL — ABNORMAL HIGH (ref 0.0–100.0)

## 2023-12-13 LAB — MAGNESIUM: Magnesium: 2.1 mg/dL (ref 1.7–2.4)

## 2023-12-13 LAB — APTT: aPTT: 33 s (ref 24–36)

## 2023-12-13 LAB — HEPARIN LEVEL (UNFRACTIONATED): Heparin Unfractionated: >1.1 [IU]/mL — ABNORMAL HIGH (ref 0.30–0.70)

## 2023-12-13 MED ORDER — HEPARIN BOLUS VIA INFUSION
4000.0000 [IU] | Freq: Once | INTRAVENOUS | Status: AC
  Administered 2023-12-13: 4000 [IU] via INTRAVENOUS
  Filled 2023-12-13: qty 4000

## 2023-12-13 MED ORDER — BUMETANIDE 0.25 MG/ML IJ SOLN
1.0000 mg | Freq: Once | INTRAMUSCULAR | Status: AC
  Administered 2023-12-13: 1 mg via INTRAVENOUS
  Filled 2023-12-13: qty 4

## 2023-12-13 MED ORDER — HEPARIN (PORCINE) 25000 UT/250ML-% IV SOLN
950.0000 [IU]/h | INTRAVENOUS | Status: DC
  Administered 2023-12-13: 1000 [IU]/h via INTRAVENOUS
  Filled 2023-12-13: qty 250

## 2023-12-13 MED ORDER — ONDANSETRON HCL 4 MG PO TABS: 4.0000 mg | ORAL_TABLET | Freq: Four times a day (QID) | ORAL | Status: AC | PRN

## 2023-12-13 MED ORDER — VITAMIN B-12 1000 MCG PO TABS
1000.0000 ug | ORAL_TABLET | Freq: Every day | ORAL | Status: DC
  Administered 2023-12-14 – 2023-12-21 (×8): 1000 ug via ORAL
  Filled 2023-12-13 (×7): qty 1
  Filled 2023-12-13: qty 2

## 2023-12-13 MED ORDER — IOHEXOL 350 MG/ML SOLN
75.0000 mL | Freq: Once | INTRAVENOUS | Status: AC | PRN
  Administered 2023-12-13: 75 mL via INTRAVENOUS

## 2023-12-13 MED ORDER — FUROSEMIDE 10 MG/ML IJ SOLN
40.0000 mg | Freq: Two times a day (BID) | INTRAMUSCULAR | Status: AC
  Administered 2023-12-14 (×2): 40 mg via INTRAVENOUS
  Filled 2023-12-13 (×2): qty 4

## 2023-12-13 MED ORDER — ACETAMINOPHEN 650 MG RE SUPP: 650.0000 mg | Freq: Four times a day (QID) | RECTAL | Status: AC | PRN

## 2023-12-13 MED ORDER — ORAL CARE MOUTH RINSE
15.0000 mL | OROMUCOSAL | Status: DC
  Administered 2023-12-14 – 2023-12-15 (×4): 15 mL via OROMUCOSAL
  Filled 2023-12-13 (×19): qty 15

## 2023-12-13 MED ORDER — ORAL CARE MOUTH RINSE: 15.0000 mL | OROMUCOSAL | Status: DC | PRN

## 2023-12-13 MED ORDER — ONDANSETRON HCL 4 MG/2ML IJ SOLN: 4.0000 mg | Freq: Four times a day (QID) | INTRAMUSCULAR | Status: AC | PRN

## 2023-12-13 MED ORDER — MELATONIN 5 MG PO TABS
5.0000 mg | ORAL_TABLET | Freq: Every evening | ORAL | Status: DC | PRN
  Administered 2023-12-14 – 2023-12-20 (×5): 5 mg via ORAL
  Filled 2023-12-13 (×5): qty 1

## 2023-12-13 MED ORDER — ACETAMINOPHEN 325 MG PO TABS
650.0000 mg | ORAL_TABLET | Freq: Four times a day (QID) | ORAL | Status: AC | PRN
  Administered 2023-12-14 – 2023-12-15 (×2): 650 mg via ORAL
  Filled 2023-12-13 (×2): qty 2

## 2023-12-13 MED ORDER — APIXABAN 5 MG PO TABS: 5.0000 mg | ORAL_TABLET | Freq: Two times a day (BID) | ORAL | Status: DC

## 2023-12-13 MED ORDER — LORAZEPAM 2 MG/ML IJ SOLN
0.5000 mg | Freq: Four times a day (QID) | INTRAMUSCULAR | Status: DC | PRN
Start: 2023-12-13 — End: 2023-12-21

## 2023-12-13 MED ORDER — HYDRALAZINE HCL 20 MG/ML IJ SOLN: 5.0000 mg | Freq: Four times a day (QID) | INTRAMUSCULAR | Status: AC | PRN

## 2023-12-13 NOTE — Assessment & Plan Note (Signed)
 Suspect secondary to acute on chronic heart failure with bilateral pleural effusion Treatment per above with diuresis Recheck LFTs in the a.m.

## 2023-12-13 NOTE — Consult Note (Signed)
 PHARMACY - ANTICOAGULATION CONSULT NOTE  Pharmacy Consult for IV Heparin Indication: chest pain/ACS  Patient Measurements: 82.2 kg, 5 ft 9 in per [facility] records 12/12/23  Labs: Recent Labs    12/13/23 1344 12/13/23 1547  HGB 13.6  --   HCT 42.6  --   PLT 207  --   LABPROT 17.0*  --   INR 1.4*  --   CREATININE 0.92  --   TROPONINIHS 322* 405*    CrCl cannot be calculated (Unknown ideal weight.).   Medical History: History reviewed. No pertinent past medical history.  Medications:  Apixaban 5 mg BID, last dose reported 12/13/23 AM, exact time not reported  Assessment: Patient arriving to ED 12/13/23 PM with unresponsiveness. 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 Further history of HFrEF, HTN, Afib on apixaban, CAD. Pharmacy consulted to initiate and manage heparin infusion for suspected ACS.  Baseline CBC within normal limits. Baseline aPTT, heparin level, INR are pending  Goal of Therapy:  Heparin level 0.3-0.7 units/ml aPTT 66 - 102 seconds Monitor platelets by anticoagulation protocol: Yes   Plan:  --Heparin 4000 unit IV bolus followed by continuous infusion at 1000 units/hr --aPTT 8 hours from infusion start. Follow aPTT until correlation established with heparin level --Daily CBC per protocol while on IV heparin  Page Boast 12/13/2023,6:01 PM

## 2023-12-13 NOTE — H&P (Signed)
 History and Physical   OTHOR VINES WJX:[id number] DOB: 05-04-1941 DOA: 12/13/2023  PCP: Alena Hush, MD  Outpatient Specialists: Dr. Beau Bound, [facility] Cardiology Patient coming from: home  I have personally briefly reviewed patient's old medical records in [facility] EMR.  Chief Concern: unresponsiveness  HPI: Bob Ortiz is an 83 year old male with history of CAD with calcification, left bundle branch block, nonischemic cardiomyopathy, hypertension, chronic atrial fibrillation, on Eliquis, who presents ED for chief concerns of seizure-like activity.  Vitals in the ED showed temperature of 95.8, respiration rate 17, heart rate 70, blood pressure 129/89, SpO2 100% on 5 L nasal cannula.  Serum sodium is 130, potassium 3.6, chloride 96, bicarb 23, BUN of 13, serum creatinine 0.92, EGFR greater than 60, nonfasting blood glucose 100, WBC 9.2, hemoglobin 13.6, platelets of 207.  BNP was elevated at 788.7, hs troponin was 322.  ED treatment: Bumex 1 mg IV one-time dose. ------------------------------------- At bedside, patient is able to tell me his first and last name, age, current location of hospital.  He reports that he is having some chest pain and is started about 2 hours ago.  He reports the chest pain is worse with inspiration.  Social history: He lives at home.  ROS: Constitutional: no weight change, no fever ENT/Mouth: no sore throat, no rhinorrhea Eyes: no eye pain, no vision changes Cardiovascular: + chest pain, + dyspnea,  no edema, no palpitations Respiratory: no cough, no sputum, no wheezing Gastrointestinal: no nausea, no vomiting, no diarrhea, no constipation Genitourinary: no urinary incontinence, no dysuria, no hematuria Musculoskeletal: no arthralgias, no myalgias Skin: no skin lesions, no pruritus, Neuro: + weakness, no loss of consciousness, no syncope Psych: no anxiety, no depression, + decrease appetite Heme/Lymph: no bruising, no bleeding  ED Course:  Discussed with EDP, patient requiring hospitalization for chief concerns of unresponsiveness and possible seizure activity.  Assessment/Plan  Principal Problem:   Seizure (HCC) Active Problems:   Elevated troponin   Bilateral pleural effusion   Atrial fibrillation, chronic (HCC)   Essential hypertension   Transaminitis   NICM (nonischemic cardiomyopathy) (HCC)   Assessment and Plan:  * Seizure (HCC) Seizure, fall, aspiration precaution Neurology has been consulted by EDP Lorazepam 0.5 mg IV every 6 hours as needed for seizure, 2 doses ordered  Elevated troponin First HS Trop is 322, second troponin level is in process  Heparin per pharmacy initiated Patient endorses chest pain with breathing, etiology workup in progress, differentials include NSTEMI versus musculoskeletal chest pain in setting of recent chest compression at home Cardiology has been consulted, to Dr. Braxton Calico via secure chat.  Dr. Custovic is aware. Heparin gtt. initiated  NICM (nonischemic cardiomyopathy) (HCC) Complete echo with contrast on 12/06/2023: Estimate ejection fraction is 20%, with grade 3 diastolic dysfunction  Transaminitis Suspect secondary to acute on chronic heart failure with bilateral pleural effusion Treatment per above with diuresis Recheck LFTs in the a.m.  Essential hypertension Hydralazine 5 mg IV every 6 hours.  For SBP greater than 170, 5 days ordered  Bilateral pleural effusion Left > right Furosemide 40 mg IV twice daily, 2 doses ordered on admission for 12/14/2023 A.m. team can consider thoracentesis pending I's and O's and cardiology evaluation Per daughter at bedside, patient had pleural effusion at [facility] however thoracentesis was not done as patient responded well to IV diuresis. Strict I's and O's  Chart reviewed.   DVT prophylaxis: apixaban 5 mg BID Code Status: full code  Diet: N.p.o. pending nursing swallow screening Family Communication:  updated daughter    Disposition Plan: Pending clinical course Consults called: cardiology  Admission status: Telemetry cardiac, inpatient  History reviewed. No pertinent past medical history. Past Surgical History:  Procedure Laterality Date   FRACTURE SURGERY Right    shoulder   Social History:  reports that he quit smoking about 50 years ago. He has never used smokeless tobacco. He reports current alcohol use. He reports that he does not use drugs.  Allergies  Allergen Reactions   Quinapril Swelling   Bumetanide Other (See Comments)   Spironolactone Other (See Comments)   Torsemide Other (See Comments)   Family History  Problem Relation Age of Onset   Stomach cancer Mother    Heart attack Father 96   Stroke Father    Family history: Family history reviewed and not pertinent.  Prior to Admission medications   Medication Sig Start Date End Date Taking? Authorizing Provider  bumetanide (BUMEX) 1 MG tablet Take by mouth. 09/10/23 09/09/24  [provider]  spironolactone (ALDACTONE) 25 MG tablet Take by mouth. 07/08/23   [provider]  traMADol  (ULTRAM ) 50 MG tablet Take 1 tablet (50 mg total) by mouth 2 (two) times daily. 03/22/17   Menshew, Raye Cai, PA-C   Physical Exam: Vitals:   12/13/23 1630 12/13/23 1700 12/13/23 1730 12/13/23 1901  BP: 116/68 113/76 102/74   Pulse: 70 70 70   Resp: 16 17 12    Temp:      TempSrc:      SpO2: 100% 94% 97%   Weight:    83.9 kg  Height:    5\' 9"  (1.753 m)   Constitutional: appears frail, acutely ill Eyes: PERRL, lids and conjunctivae normal ENMT: Mucous membranes are moist. Posterior pharynx clear of any exudate or lesions. Age-appropriate dentition. Hearing appropriate Neck: normal, supple, no masses, no thyromegaly Respiratory: clear to auscultation bilaterally, no wheezing, no crackles. Normal respiratory effort. No accessory muscle use.  Cardiovascular: Regular rate and rhythm, no murmurs / rubs / gallops. No extremity edema.  2+ pedal pulses. No carotid bruits.  Abdomen: no tenderness, no masses palpated, no hepatosplenomegaly. Bowel sounds positive.  Musculoskeletal: no clubbing / cyanosis. No joint deformity upper and lower extremities. Good ROM, no contractures, no atrophy. Normal muscle tone.  Skin: no rashes, lesions, ulcers. No induration Neurologic: Sensation intact. Strength 5/5 in all 4.  Psychiatric: Normal judgment and insight. Alert and oriented x 3. Normal mood.   EKG: independently reviewed, showing rate of 70, QTc 611, right bundle branch block  Chest x-ray on Admission: I personally reviewed and I agree with radiologist reading as below.  US  ABDOMEN LIMITED RUQ (LIVER/GB) Result Date: 12/13/2023 CLINICAL DATA:  Transaminitis EXAM: ULTRASOUND ABDOMEN LIMITED RIGHT UPPER QUADRANT COMPARISON:  Chest x-ray and chest CTA earlier 12/13/2023 FINDINGS: Gallbladder: Previous cholecystectomy. Common bile duct: Diameter: 2 mm Liver: No focal lesion identified. Within normal limits in parenchymal echogenicity. Portal vein is patent on color Doppler imaging with normal direction of blood flow towards the liver. Other: Right pleural effusion. Please correlate with prior x-ray and CTA. IMPRESSION: Previous cholecystectomy.  No ductal dilatation. Electronically Signed   By: Adrianna Horde M.D.   On: 12/13/2023 16:39   CT Head Wo Contrast Result Date: 12/13/2023 CLINICAL DATA:  Provided history: Mental status change, unknown cause. EXAM: CT HEAD WITHOUT CONTRAST TECHNIQUE: Contiguous axial images were obtained from the base of the skull through the vertex without intravenous contrast. RADIATION DOSE REDUCTION: This exam was performed according to the departmental  dose-optimization program which includes automated exposure control, adjustment of the mA and/or kV according to patient size and/or use of iterative reconstruction technique. COMPARISON:  Head CT 09/01/2023. FINDINGS: Brain: Generalized cerebral atrophy. Patchy and  ill-defined hypoattenuation within the cerebral white matter, nonspecific but compatible with mild chronic small vessel ischemic disease. There is no acute intracranial hemorrhage. No demarcated cortical infarct. No extra-axial fluid collection. No evidence of an intracranial mass. No midline shift. Vascular: No hyperdense vessel.  Atherosclerotic calcifications. Skull: No calvarial fracture or aggressive osseous lesion. Visible sinuses/orbits: No mass or acute finding within the imaged orbits. Minimal mucosal thickening within the right ethmoid and right sphenoid sinuses. Other: Trace left mastoid effusion. IMPRESSION: 1. No evidence of an acute intracranial abnormality. 2. Parenchymal atrophy and chronic small vessel ischemic disease. 3. Minor paranasal sinus mucosal thickening at the imaged levels. 4. Trace left mastoid effusion. Electronically Signed   By: Bascom Lily D.O.   On: 12/13/2023 15:36   DG Chest 1 View Result Date: 12/13/2023 CLINICAL DATA:  Unresponsiveness EXAM: CHEST  1 VIEW COMPARISON:  March 22, 2017 FINDINGS: No change in the left subclavian bipolar ICDF pacemaker device tip of the leads in good position no evidence of discontinuity Bilateral interstitial prominence and small left effusion could correlate with congestive changes. Heart upper limits of normal. No pneumothorax. IMPRESSION: Congestive changes. Electronically Signed   By: Fredrich Jefferson M.D.   On: 12/13/2023 15:14   CT Angio Chest PE W/Cm &/Or Wo Cm Result Date: 12/13/2023 CLINICAL DATA:  Pulmonary embolism EXAM: CT ANGIOGRAPHY CHEST WITH CONTRAST TECHNIQUE: Multidetector CT imaging of the chest was performed using the standard protocol during bolus administration of intravenous contrast. Multiplanar CT image reconstructions and MIPs were obtained to evaluate the vascular anatomy. RADIATION DOSE REDUCTION: This exam was performed according to the departmental dose-optimization program which includes automated exposure  control, adjustment of the mA and/or kV according to patient size and/or use of iterative reconstruction technique. CONTRAST:  75mL OMNIPAQUE IOHEXOL 350 MG/ML SOLN COMPARISON:  None Available. FINDINGS: Cardiovascular: Satisfactory opacification of the pulmonary arteries to the segmental level. No evidence of pulmonary embolism. Normal heart size. No pericardial effusion. Coronary artery calcifications, small to medium-sized pericardial effusion Mediastinum/Nodes: No enlarged mediastinal, hilar, or axillary lymph nodes. Thyroid gland, trachea, and esophagus demonstrate no significant findings. Lungs/Pleura: Bilateral pleural effusions, left larger than right with basilar atelectasis. Interstitial prominence with ground-glass appearance could correlate with congestive changes versus superimposed pneumonitis. No evidence of pulmonary embolism. No aortic dissection on this limited enhanced pulmonary CT angiogram images Upper Abdomen: No acute abnormality. Musculoskeletal: No chest wall abnormality. No acute or significant osseous findings. Review of the MIP images confirms the above findings. IMPRESSION: *No evidence of pulmonary embolism. *Bilateral pleural effusions, left larger than right with basilar atelectasis. *Interstitial prominence with ground-glass appearance could correlate with congestive changes versus superimposed pneumonitis. *Coronary artery calcifications, small to medium-sized pericardial effusion. Electronically Signed   By: Fredrich Jefferson M.D.   On: 12/13/2023 15:09   Labs on Admission: I have personally reviewed following labs  CBC: Recent Labs  Lab 12/13/23 1344  WBC 9.2  NEUTROABS 6.8  HGB 13.6  HCT 42.6  MCV 100.0  PLT 207   Basic Metabolic Panel: Recent Labs  Lab 12/13/23 1344  NA 130*  K 3.6  CL 96*  CO2 23  GLUCOSE 100*  BUN 13  CREATININE 0.92  CALCIUM 8.4*  MG 2.1  PHOS 3.9   GFR: Estimated Creatinine Clearance: 61.9 mL/min (  by C-G formula based on SCr of  0.92 mg/dL).  Liver Function Tests: Recent Labs  Lab 12/13/23 1344  AST 105*  ALT 61*  ALKPHOS 131*  BILITOT 0.9  PROT 6.4*  ALBUMIN 3.1*   Coagulation Profile: Recent Labs  Lab 12/13/23 1344  INR 1.4*   Urine analysis:    Component Value Date/Time   COLORURINE STRAW (A) 12/13/2023 1645   APPEARANCEUR CLEAR (A) 12/13/2023 1645   LABSPEC 1.023 12/13/2023 1645   PHURINE 6.0 12/13/2023 1645   GLUCOSEU 150 (A) 12/13/2023 1645   HGBUR NEGATIVE 12/13/2023 1645   BILIRUBINUR NEGATIVE 12/13/2023 1645   KETONESUR NEGATIVE 12/13/2023 1645   PROTEINUR 100 (A) 12/13/2023 1645   NITRITE NEGATIVE 12/13/2023 1645   LEUKOCYTESUR NEGATIVE 12/13/2023 1645   CRITICAL CARE Performed by:Dr. Bastion Bolger  Total critical care time: 32 minutes  Critical care time was exclusive of separately billable procedures and treating other patients.  Critical care was necessary to treat or prevent imminent or life-threatening deterioration.  Critical care was time spent personally by me on the following activities: development of treatment plan with daughter as well as nursing, discussions with consultants, evaluation of patient's response to treatment, examination of patient, obtaining history from patient or surrogate, ordering and performing treatments and interventions, ordering and review of laboratory studies, ordering and review of radiographic studies, pulse oximetry and re-evaluation of patient's condition.  This document was prepared using Dragon Voice Recognition software and may include unintentional dictation errors.  Dr. Reinhold Carbine [facility]  If 7PM-7AM, please contact overnight-coverage provider If 7AM-7PM, please contact day attending provider [email or web address]  12/13/2023, 7:25 PM

## 2023-12-13 NOTE — Assessment & Plan Note (Signed)
 Seizure, fall, aspiration precaution Neurology has been consulted by EDP Lorazepam 0.5 mg IV every 6 hours as needed for seizure, 2 doses ordered

## 2023-12-13 NOTE — Hospital Course (Addendum)
 [name] is an 83 year old male with history of CAD with calcification, left bundle branch block, nonischemic cardiomyopathy, hypertension, chronic atrial fibrillation, on Eliquis , who presents ED for chief concerns of seizure-like activity.  BNP was elevated at 788.7, hs troponin was 322.  Neurology, palliative care and cardiology was consulted.  Neurology with concern of Lewy body dementia and recommending outpatient evaluation for final diagnosis.No need for any antiseizure medications as he might be having spontaneous movements due to transient brain hypoxia with very low EF or a syncopal convulsion.  EEG was negative.  CT head was negative for any acute abnormality. Patient do have some intermittent agitation requiring medication.  Family concern of starting hallucinations with diuretics.  Patient did receive IV Lasix  while in the hospital and later switched to Edecrin  initially scheduled and now mediate as needed.  Patient with history of HFrEF with EF of about 20%, grade 3 diastolic dysfunction secondary to nonischemic cardiomyopathy.  He was on GDMT but has stopped taking that due to intolerance.  Currently will take low-dose Coreg and Edecrin  as needed.  And need to have a close follow-up with cardiology for further assistance.  Patient was found to have mildly elevated troponin likely due to demand supply mismatch.  ACS ruled out.  Patient did had mild transaminitis on admission likely due to acute on chronic HFrEF with bilateral pleural effusion which continued to improve with diuresis. Also found to have bilateral pleural effusion likely with heart failure.  Initially required oxygen and later weaned back to room air.  Patient with overall poor prognosis due to significant underlying comorbidities and advanced age.  Palliative care was also consulted but family would like to keep him full code for now.  They are recommending outpatient palliative care and transitioning to hospice  whenever patient and family is ready.  Our physical therapist also evaluated him and recommending going to rehab before returning home where he is being discharged to regain his strength.  Patient will continue on current medications need to have a close follow-up with his providers for further assistance.
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
## 2023-12-13 NOTE — Assessment & Plan Note (Addendum)
 Left > right Furosemide 40 mg IV twice daily, 2 doses ordered on admission for 12/14/2023 A.m. team can consider thoracentesis pending I's and O's and cardiology evaluation Per daughter at bedside, patient had pleural effusion at [facility] however thoracentesis was not done as patient responded well to IV diuresis. Strict I's and O's

## 2023-12-13 NOTE — Assessment & Plan Note (Signed)
 Complete echo with contrast on 12/06/2023: Estimate ejection fraction is 20%, with grade 3 diastolic dysfunction

## 2023-12-13 NOTE — ED Provider Notes (Addendum)
 Bob Ortiz Provider Note    Event Date/Time   First MD Initiated Contact with Patient 12/13/23 1345     (approximate)   History   unresponsive   HPI  Bob Ortiz is a 83 y.o. 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  Per EMS patient's family found him unresponsive, they were called there due to snoring respirations, family witnessed seizure-like activity without prior history of seizures.  They called 911 and were told to start chest compressions.  Family did 10 minutes of chest compressions.  Unclear if he lost a pulse.  When the fire department arrived, he had a pulse, having agonal breathing, they bagged him, when EMS got there he was talking, following commands, they placed him on a nonrebreather and brought him here.  He denies any prior lightheadedness, chest pain, shortness of breath.  Denies of cough, nausea, vomiting, diarrhea, abdominal pain.  Independent history obtained from EMS.  On independent review, he was admitted in early May at [facility] for CHF exacerbation, persistent atrial fibrillation, bradycardia, status post pacemaker placement.  Also with history of cognitive deficits, they are suspecting baseline exacerbation by hospitalization and delirium, he was referred to geriatric clinic.  Echo that was done on 5 May showed EF of 20%, grade 3 diastolic dysfunction.     Physical Exam   Triage Vital Signs: ED Triage Vitals  Encounter Vitals Group     BP      Systolic BP Percentile      Diastolic BP Percentile      Pulse      Resp      Temp      Temp src      SpO2      Weight      Height      Head Circumference      Peak Flow      Pain Score      Pain Loc      Pain Education      Exclude from Growth Chart     Most recent vital signs: Vitals:   12/13/23 1500 12/13/23  1530  BP: 115/75 120/79  Pulse: 70 70  Resp: 17 (!) 23  Temp:    SpO2: 98% 97%     General: Awake, no distress.  CV:  Good peripheral perfusion.  Resp:  Normal effort.  Clear Abd:  No distention.  Soft nontender Other:  Pupils are equal and reactive, he is moving all 4 extremities without focal weakness, no obvious facial asymmetry.  Equal DP pulses bilaterally.  No unilateral calf swelling or tenderness, he does have trace lower extremity edema   ED Results / Procedures / Treatments   Labs (all labs ordered are listed, but only abnormal results are displayed) Labs Reviewed  COMPREHENSIVE METABOLIC PANEL WITH GFR - Abnormal; Notable for the following components:      Result Value   Sodium 130 (*)    Chloride 96 (*)    Glucose, Bld 100 (*)    Calcium 8.4 (*)    Total Protein 6.4 (*)    Albumin 3.1 (*)    AST 105 (*)    ALT 61 (*)    Alkaline Phosphatase 131 (*)    All other components within normal limits  BRAIN NATRIURETIC PEPTIDE - Abnormal; Notable for the following components:   B Natriuretic  Peptide 780.7 (*)    All other components within normal limits  CBC WITH DIFFERENTIAL/PLATELET - Abnormal; Notable for the following components:   Abs Immature Granulocytes 0.28 (*)    All other components within normal limits  PROTIME-INR - Abnormal; Notable for the following components:   Prothrombin Time 17.0 (*)    INR 1.4 (*)    All other components within normal limits  TROPONIN I (HIGH SENSITIVITY) - Abnormal; Notable for the following components:   Troponin I (High Sensitivity) 322 (*)    All other components within normal limits  CULTURE, BLOOD (ROUTINE X 2)  CULTURE, BLOOD (ROUTINE X 2)  MAGNESIUM  PHOSPHORUS  URINALYSIS, W/ REFLEX TO CULTURE (INFECTION SUSPECTED)  BLOOD GAS, VENOUS  LACTIC ACID, PLASMA  LACTIC ACID, PLASMA  TROPONIN I (HIGH SENSITIVITY)     EKG  EKG shows, EKG shows nocturnal rhythm, widened QRS, does not meet Sgarbossa's criteria, this  is changed compared to prior  RADIOLOGY On my independent interpretation, CT head without obvious intracranial hemorrhage   PROCEDURES:  Critical Care performed: Yes, see critical care procedure note(s)  .Critical Care  Performed by: Shane Darling, MD Authorized by: Shane Darling, MD   Critical care provider statement:    Critical care time (minutes):  40   Critical care was necessary to treat or prevent imminent or life-threatening deterioration of the following conditions:  Respiratory failure   Critical care was time spent personally by me on the following activities:  Development of treatment plan with patient or surrogate, discussions with consultants, evaluation of patient's response to treatment, examination of patient, ordering and review of laboratory studies, ordering and review of radiographic studies, ordering and performing treatments and interventions, pulse oximetry, re-evaluation of patient's condition and review of old charts    MEDICATIONS ORDERED IN ED: Medications  bumetanide (BUMEX) injection 1 mg (has no administration in time range)  iohexol (OMNIPAQUE) 350 MG/ML injection 75 mL (75 mLs Intravenous Contrast Given 12/13/23 1404)     IMPRESSION / MDM / ASSESSMENT AND PLAN / ED COURSE  I reviewed the triage vital signs and the nursing notes.                              Differential diagnosis includes, but is not limited to, seizure versus syncope, arrhythmia, electrolyte derangements, ACS, CHF, occult infection.  Will get labs, EKG, troponin, chest x-ray, CT head.  Given recent procedure and unresponsiveness, also consider PE, will get a CT PE study as well.  He will likely need to be admitted for further management.  Will also interrogate his pacemaker.  Patient's presentation is most consistent with acute presentation with potential threat to life or bodily function.  Independent interpretation of labs and imaging below.  Discussed results with family, they  would prefer to stay here for his care since his cardiologist is here.  Consult hospitalist.  Will plan for admission will evaluate the patient.  He is admitted.  Will also reach out to neurology for his seizure episode. Consulted neuro who will eval the pt. He is admitted.   The patient is on the cardiac monitor to evaluate for evidence of arrhythmia and/or significant heart rate changes.   Clinical Course as of 12/13/23 1552  Mon Dec 13, 2023  1508 Independent review of labs, his troponin is elevated, BNP is elevated, magnesium is normal, no leukocytosis, he has mild hyponatremia, creatinine is normal, mild LFT elevations,  phosphorus is normal. [TT]  1508 With the LFT elevations, will get a right upper quadrant ultrasound.  Patient has no abdominal pain at this time. [TT]  1552 CT Head Wo Contrast IMPRESSION: 1. No evidence of an acute intracranial abnormality. 2. Parenchymal atrophy and chronic small vessel ischemic disease. 3. Minor paranasal sinus mucosal thickening at the imaged levels. 4. Trace left mastoid effusion.   [TT]  1552 DG Chest 1 View Congestive changes.  [TT]  1552 CT Angio Chest PE W/Cm &/Or Wo Cm IMPRESSION: *No evidence of pulmonary embolism. *Bilateral pleural effusions, left larger than right with basilar atelectasis. *Interstitial prominence with ground-glass appearance could correlate with congestive changes versus superimposed pneumonitis. *Coronary artery calcifications, small to medium-sized pericardial effusion.   [TT]    Clinical Course User Index [TT] Drenda Gentle Richard Champion, MD     FINAL CLINICAL IMPRESSION(S) / ED DIAGNOSES   Final diagnoses:  Acute on chronic congestive heart failure, unspecified heart failure type (HCC)  Episode of shaking  Elevated troponin     Rx / DC Orders   ED Discharge Orders     None        Note:  This document was prepared using Dragon voice recognition software and may include unintentional dictation  errors.    Shane Darling, MD 12/13/23 1611    Shane Darling, MD 12/13/23 [phone]

## 2023-12-13 NOTE — Assessment & Plan Note (Addendum)
 First HS Trop is 322, second troponin level is in process  Heparin per pharmacy initiated Patient endorses chest pain with breathing, etiology workup in progress, differentials include NSTEMI versus musculoskeletal chest pain in setting of recent chest compression at home Cardiology has been consulted, to Dr. Custovic via secure chat.  Dr. Custovic is aware. Heparin gtt. initiated

## 2023-12-13 NOTE — ED Notes (Signed)
 Called CCMD to add pt to monitoring.

## 2023-12-13 NOTE — Assessment & Plan Note (Signed)
 Hydralazine 5 mg IV every 6 hours.  For SBP greater than 170, 5 days ordered

## 2023-12-13 NOTE — ED Notes (Signed)
 As oral temperature has reached 97.16F, bair hugger has been turned off.

## 2023-12-13 NOTE — ED Triage Notes (Signed)
 Pt arrives via [facility] from home with c/o "dad snoring" and per EMS per family pt was d/c from [facility] yesterday with a pacemaker placement. Family stated that pt had snoring respirations, and seizure like activity with no Hx of the same, and family did about 10 mins of CPR before fire arrived. Per fire, pt had agonal breathing and they bagged pt. Per EMS pt was moaning.

## 2023-12-13 NOTE — Progress Notes (Signed)
   12/13/23 1345  Spiritual Encounters  Type of Visit Initial  Care provided to: Pt and family  Conversation partners present during encounter Nurse  Referral source Chaplain assessment  Reason for visit Routine spiritual support  OnCall Visit No  Interventions  Spiritual Care Interventions Made Established relationship of care and support;Compassionate presence;Reflective listening;Encouragement  Intervention Outcomes  Outcomes Connection to spiritual care;Awareness around self/spiritual resourses  Spiritual Care Plan  Spiritual Care Issues Still Outstanding Chaplain will continue to follow   Chaplain brought daughter, [name], to room and sat with her for compassionate presence and reflective listening to what has been transpiring with her father.

## 2023-12-14 ENCOUNTER — Inpatient Hospital Stay: Admit: 2023-12-14 | Discharge: 2023-12-14 | Disposition: A | Discharge status: Home / Self Care | Attending: Student

## 2023-12-14 ENCOUNTER — Inpatient Hospital Stay

## 2023-12-14 DIAGNOSIS — I482 Chronic atrial fibrillation, unspecified: Secondary | ICD-10-CM | POA: Diagnosis not present

## 2023-12-14 DIAGNOSIS — I469 Cardiac arrest, cause unspecified: Secondary | ICD-10-CM | POA: Diagnosis not present

## 2023-12-14 DIAGNOSIS — J90 Pleural effusion, not elsewhere classified: Secondary | ICD-10-CM | POA: Diagnosis not present

## 2023-12-14 DIAGNOSIS — Z7189 Other specified counseling: Secondary | ICD-10-CM | POA: Diagnosis not present

## 2023-12-14 DIAGNOSIS — R7989 Other specified abnormal findings of blood chemistry: Secondary | ICD-10-CM | POA: Diagnosis not present

## 2023-12-14 DIAGNOSIS — R569 Unspecified convulsions: Secondary | ICD-10-CM | POA: Diagnosis not present

## 2023-12-14 LAB — BASIC METABOLIC PANEL WITH GFR
Anion gap: 7 (ref 5–15)
BUN: 10 mg/dL (ref 8–23)
CO2: 22 mmol/L (ref 22–32)
Calcium: 7.4 mg/dL — ABNORMAL LOW (ref 8.9–10.3)
Chloride: 101 mmol/L (ref 98–111)
Creatinine, Ser: 0.74 mg/dL (ref 0.61–1.24)
GFR, Estimated: >60 mL/min (ref >60)
Glucose, Bld: 64 mg/dL — ABNORMAL LOW (ref 70–99)
Potassium: 3.5 mmol/L (ref 3.5–5.1)
Sodium: 130 mmol/L — ABNORMAL LOW (ref 135–145)

## 2023-12-14 LAB — CBC
HCT: 36.4 % — ABNORMAL LOW (ref 39.0–52.0)
Hemoglobin: 12.1 g/dL — ABNORMAL LOW (ref 13.0–17.0)
MCH: 31.5 pg (ref 26.0–34.0)
MCHC: 33.2 g/dL (ref 30.0–36.0)
MCV: 94.8 fL (ref 80.0–100.0)
Platelets: 189 10*3/uL (ref 150–400)
RBC: 3.84 MIL/uL — ABNORMAL LOW (ref 4.22–5.81)
RDW: 14.2 % (ref 11.5–15.5)
WBC: 6.9 10*3/uL (ref 4.0–10.5)
nRBC: 0 % (ref 0.0–0.2)

## 2023-12-14 LAB — HEPATIC FUNCTION PANEL
ALT: 47 U/L — ABNORMAL HIGH (ref 0–44)
AST: 69 U/L — ABNORMAL HIGH (ref 15–41)
Albumin: 2.7 g/dL — ABNORMAL LOW (ref 3.5–5.0)
Alkaline Phosphatase: 105 U/L (ref 38–126)
Bilirubin, Direct: 0.3 mg/dL — ABNORMAL HIGH (ref 0.0–0.2)
Indirect Bilirubin: 0.8 mg/dL (ref 0.3–0.9)
Total Bilirubin: 1.1 mg/dL (ref 0.0–1.2)
Total Protein: 5.4 g/dL — ABNORMAL LOW (ref 6.5–8.1)

## 2023-12-14 LAB — MAGNESIUM: Magnesium: 2 mg/dL (ref 1.7–2.4)

## 2023-12-14 LAB — APTT
aPTT: 103 s — ABNORMAL HIGH (ref 24–36)
aPTT: 82 s — ABNORMAL HIGH (ref 24–36)

## 2023-12-14 LAB — TSH: TSH: 4.346 u[IU]/mL (ref 0.350–4.500)

## 2023-12-14 LAB — T4, FREE: Free T4: 0.82 ng/dL (ref 0.61–1.12)

## 2023-12-14 LAB — HEPARIN LEVEL (UNFRACTIONATED): Heparin Unfractionated: >1.1 [IU]/mL — ABNORMAL HIGH (ref 0.30–0.70)

## 2023-12-14 MED ORDER — RISPERIDONE 1 MG PO TBDP
0.5000 mg | ORAL_TABLET | Freq: Every day | ORAL | Status: DC
  Administered 2023-12-14: 0.5 mg via ORAL
  Filled 2023-12-14: qty 0.5

## 2023-12-14 MED ORDER — HALOPERIDOL LACTATE 5 MG/ML IJ SOLN: 1.0000 mg | Freq: Four times a day (QID) | INTRAMUSCULAR | Status: DC | PRN

## 2023-12-14 MED ORDER — APIXABAN 5 MG PO TABS
5.0000 mg | ORAL_TABLET | Freq: Two times a day (BID) | ORAL | Status: DC
  Administered 2023-12-14 – 2023-12-21 (×14): 5 mg via ORAL
  Filled 2023-12-14 (×14): qty 1

## 2023-12-14 NOTE — Evaluation (Addendum)
 Clinical/Bedside Swallow Evaluation Patient Details  Name: Bob Ortiz MRN: [id number] Date of Birth: Dec 25, 1940  Today's Date: 12/14/2023 Time: SLP Start Time (ACUTE ONLY): 1325 SLP Stop Time (ACUTE ONLY): 1425 SLP Time Calculation (min) (ACUTE ONLY): 60 min  Past Medical History: History reviewed. No pertinent past medical history. Past Surgical History:  Past Surgical History:  Procedure Laterality Date   FRACTURE SURGERY Right    shoulder   HPI:  Pt is an 83 year old male with history of  Addison's disease, CAD with calcification, CHF, left bundle branch block, nonischemic cardiomyopathy, hypertension, chronic atrial fibrillation, on Eliquis , who presents ED for chief concerns of seizure-like activity.    OF NOTE per [facility] chart note: pt had an admit for "Hallucinations, delirium-patient has had waxing and waning mental status as well as hallucinations al since December. Seems likely related to delirium and metabolic encephalopathy related to heart failure although a neuro degenerative disease could also be considered.". Per chart notes, formal assessment by Neurology re: potential Dementia has been recommended by Neurology(post Acute illness).   Chest Imaging: Lungs/Pleura: Bilateral pleural effusions, left larger than right with basilar atelectasis. Interstitial prominence with ground-glass appearance could correlate with congestive changes versus superimposed pneumonitis.  Head Imaging: No evidence of an acute intracranial abnormality. 2. Parenchymal atrophy and chronic small vessel ischemic disease.    Assessment / Plan / Recommendation  Clinical Impression   Pt seen for BSE today. Pt awake, talkative and resting in bed. Noted he was Confused and often picking at covers/cords. He followed basic commands w/ MOD cue. A Posey Mat was obtained for him as an activity mat for him which he appeared to enjoy working on. Noted chart notes w/ concern for Cognitive decline, Dementia. Daughter  present.  On [place] O2 support 2L; afebrile. Sodium 130.  Pt appears to present w/ functional oropharyngeal phase swallowing w/ No oropharyngeal phase dysphagia noted, No neuromuscular deficits noted. Pt consumed po trials feeding self w/ No overt, clinical s/s of aspiration during the po trials.  Pt appears at reduced risk for aspiration following general aspiration precautions. However, pt does have challenging factors that could impact oropharyngeal swallowing to include deconditioning/weakness, repeated hospitalization w/ recent seizure, and a pacemaker placement as well as need for setup and support at meals d/t decreased awareness/confusion/hallucination(of man in the room behind the bed). These factors can increase risk for dysphagia as well as decreased oral intake overall.   During po trials, pt consumed all consistencies w/ no overt coughing, decline in vocal quality, or change in respiratory presentation during/post trials. O2 sats 99%. Oral phase appeared grossly [facility] w/ timely bolus management, mastication, and control of bolus propulsion for A-P transfer for swallowing. Oral clearing achieved w/ all trial consistencies -- moistened, soft foods given.  OM Exam appeared [facility] w/ no unilateral weakness noted. Speech Clear. Pt fed self w/ setup support.   Recommend a Regular consistency diet w/ well-Cut meats, moistened foods; Thin liquids -- monitor straw use, and pt should help to Hold Cup when drinking. Recommend general aspiration precautions, reduce distractions during meals. Tray setup and sitting up at meals. Supervision during meals. Pills WHOLE vs Crushed in Puree for safer, easier swallowing.  Education given on Pills in Puree; food consistencies and easy to eat options; general aspiration precautions to pt and Dtr. ST services will monitor pt's status and toleration of diet 1x for any needs, education. NSG updated, agreed. MD updated.  Recommend Dietician f/u for support. Recommend Neurology  f/u for formal  assessment Outpatient when appropriate as recommended per notes(d/t "hallucinations", Confusion reported). It is recommended that pt have Supervision upon Discharge for Safety.  SLP Visit Diagnosis: Dysphagia, unspecified (R13.10) (declined Cognitive status)    Aspiration Risk   (reduced)    Diet Recommendation   Thin;Age appropriate regular = a Regular consistency diet w/ well-Cut meats, moistened foods; Thin liquids -- monitor straw use, and pt should help to Hold Cup when drinking. Recommend general aspiration precautions, reduce distractions during meals. Tray setup and sitting up at meals. Supervision during meals.   Medication Administration: Whole meds with puree (vs Crushed in puree)    Other  Recommendations Recommended Consults:  (Neurology consult for Cognitive function assessment) Oral Care Recommendations: Oral care BID;Staff/trained caregiver to provide oral care    Recommendations for follow up therapy are one component of a multi-disciplinary discharge planning process, led by the attending physician.  Recommendations may be updated based on patient status, additional functional criteria and insurance authorization.  Follow up Recommendations No SLP follow up      Assistance Recommended at Discharge  FULL current d/t Cognitive decline  Functional Status Assessment Patient has had a recent decline in their functional status and demonstrates the ability to make significant improvements in function in a reasonable and predictable amount of time.  Frequency and Duration min 1 x/week  1 week       Prognosis Prognosis for improved oropharyngeal function: Good Barriers to Reach Goals: Cognitive deficits;Time post onset;Severity of deficits;Behavior Barriers/Prognosis Comment: unsure of pt's Baseline Cognitive functioning - see [facility] chart notes      Swallow Study   General Date of Onset: 12/13/23 HPI: Pt is an 83 year old male with history of  Addison's  disease, CAD with calcification, CHF, left bundle branch block, nonischemic cardiomyopathy, hypertension, chronic atrial fibrillation, on Eliquis , who presents ED for chief concerns of seizure-like activity.   OF NOTE per [facility] chart note: pt had an admit for "Hallucinations, delirium-patient has had waxing and waning mental status as well as hallucinations al since December. Seems likely related to delirium and metabolic encephalopathy related to heart failure although a neuro degenerative disease could also be considered.". Type of Study: Bedside Swallow Evaluation Previous Swallow Assessment: none Diet Prior to this Study: NPO Temperature Spikes Noted: No (wbc 6.9) Respiratory Status: Nasal cannula (2L) History of Recent Intubation: No Behavior/Cognition: Alert;Cooperative;Pleasant mood;Confused;Distractible;Requires cueing (picking) Oral Cavity Assessment: Within Functional Limits Oral Care Completed by SLP: Recent completion by staff Oral Cavity - Dentition: Adequate natural dentition Vision: Functional for self-feeding Self-Feeding Abilities: Able to feed self;Needs set up Patient Positioning: Upright in bed (MOD+ assist for upright sitting) Baseline Vocal Quality: Normal;Low vocal intensity (min) Volitional Cough: Strong    Oral/Motor/Sensory Function Overall Oral Motor/Sensory Function: Within functional limits   Ice Chips Ice chips: Not tested   Thin Liquid Thin Liquid: Within functional limits Presentation: Cup;Self Fed;Straw (~8 ozs total) Other Comments: water, juice    Nectar Thick Nectar Thick Liquid: Not tested   Honey Thick Honey Thick Liquid: Not tested   Puree Puree: Within functional limits Presentation: Self Fed;Spoon (4 ozs)   Solid     Solid: Within functional limits Presentation: Self Fed (10+ trials) Other Comments: moistened        Darla Edward, MS, CCC-SLP Speech Language Pathologist Rehab Services; [facility] [phone]  (ascom) Matasha Smigelski 12/14/2023,5:02 PM

## 2023-12-14 NOTE — Consult Note (Signed)
 PHARMACY - ANTICOAGULATION CONSULT NOTE  Pharmacy Consult for apixaban Indication: atrial fibrillation   Patient Measurements: 82.2 kg, 5 ft 9 in per [facility] records 12/12/23  Labs: Recent Labs    12/13/23 1344 12/13/23 1547 12/13/23 1846 12/14/23 0451 12/14/23 1500  HGB 13.6  --   --  12.1*  --   HCT 42.6  --   --  36.4*  --   PLT 207  --   --  189  --   APTT  --   --  33 103* 82*  LABPROT 17.0*  --   --   --   --   INR 1.4*  --   --   --   --   HEPARINUNFRC  --   --  >1.10* >1.10*  --   CREATININE 0.92  --   --  0.74  --   TROPONINIHS 322* 405*  --   --   --    Estimated Creatinine Clearance: 71.2 mL/min (by C-G formula based on SCr of 0.74 mg/dL).  Medications:  Apixaban 5 mg BID, last dose reported 12/13/23 AM, exact time not reported  Assessment: Patient arriving to ED 12/13/23 PM with unresponsiveness. 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 Further history of HFrEF, HTN, Afib on apixaban, CAD. Pharmacy consulted to initiate and manage heparin infusion for suspected ACS.  Baseline CBC within normal limits. Baseline aPTT, heparin level, INR   Plan:  --Per provider, restarted patient's home apxiaban dose at 5 mg BID  -- Continue to monitor CBC and platelets   Thank you for allowing pharmacy to participate in this patient's care.   Craven Do, PharmD Pharmacy Resident  12/14/2023 6:20 PM

## 2023-12-14 NOTE — Consult Note (Addendum)
 Consultation Note Date: 12/14/2023   Patient Name: Bob Ortiz  DOB: 1941/01/18  MRN: [id number]  Age / Sex: 83 y.o., male  PCP: Alena Hush, MD Referring Physician: Brenna Cam, MD  Reason for Consultation: Establishing goals of care  HPI/Patient Profile: PER H&P " [name] is an 83 year old male with history of CAD with calcification, left bundle branch block, nonischemic cardiomyopathy, hypertension, chronic atrial fibrillation, on Eliquis, who presents ED for chief concerns of seizure-like activity.   Vitals in the ED showed temperature of 95.8, respiration rate 17, heart rate 70, blood pressure 129/89, SpO2 100% on 5 L nasal cannula.   Serum sodium is 130, potassium 3.6, chloride 96, bicarb 23, BUN of 13, serum creatinine 0.92, EGFR greater than 60, nonfasting blood glucose 100, WBC 9.2, hemoglobin 13.6, platelets of 207.   BNP was elevated at 788.7, hs troponin was 322.   ED treatment: Bumex 1 mg IV one-time dose. ------------------------------------- At bedside, patient is able to tell me his first and last name, age, current location of hospital.   He reports that he is having some chest pain and is started about 2 hours ago.  He reports the chest pain is worse with inspiration."  Clinical Assessment and Goals of Care: Notes and labs reviewed. In to see patient. Patient is resting in bed with daughter [name] at bedside. He is able to tell me his name and that he is married. He is confused stating he is in [facility]. He does not recognize his daughter. He is unable to answer other questions regarding his family, past, or functional status.  Daughter states patient is more confused today than yesterday.  She discusses that she feels that his confusion is related directly to diuretics.  Daughter states she is HPOA for her father.  She states those documents are at home in addition to an  advance directive.  She states the patient's wife lives in the home along with her and other family members.   She discusses that 5 months ago his diuretics were increased and since then he has been confused and has been hallucinating.  She states during this time he has had around 3 periods of being lucid; she advises is lucid times occurred when she held his diuretics.  She states "it is like a switch".  She states he is either fully lucid, or fully confused.  She discusses his diagnoses and the updates that have been shared.  She questions and discusses concerns with performing CPR on him prior to EMS arrival.  She discusses the seizure-like activity and breath-holding.  She discusses frothing from the mouth and turning blue.  She discusses that he urinated on himself.  The difference between an aggressive medical intervention path and a comfort care path was discussed.  Values and goals of care important to patient and family were attempted to be elicited.  She states her father would never want to live in facility, and would like to remain home with his family.  She states he  has been clear that he would never want to live in a vegetative state, but would want to "try" CPR.  Discussed limitations of medical interventions to prolong quality of life in some situations and discussed the concept of human mortality.  She states he is a man of faith, and quality of life is important.  We discussed his ongoing symptoms and what would be considered acceptable quality life.  Discussed continuing conversations.  [name] advises family will bring in advance directives.    SUMMARY OF RECOMMENDATIONS   Continue current care.  PMT will follow.   Prognosis:  Poor overall       Primary Diagnoses: Present on Admission: **None**   I have reviewed the medical record, interviewed the patient and family, and examined the patient. The following aspects are pertinent.  History reviewed. No  pertinent past medical history. Social History   Socioeconomic History   Marital status: Married    Spouse name: Not on file   Number of children: Not on file   Years of education: Not on file   Highest education level: Not on file  Occupational History   Not on file  Tobacco Use   Smoking status: Former    Current packs/day: 0.00    Types: Cigarettes    Quit date: 29    Years since quitting: 50.3   Smokeless tobacco: Never  Vaping Use   Vaping status: Never Used  Substance and Sexual Activity   Alcohol use: Yes    Comment: occassionally   Drug use: Never   Sexual activity: Not on file  Other Topics Concern   Not on file  Social History Narrative   Not on file   Social Drivers of Health   Financial Resource Strain: Low Risk  (12/04/2023)   [facility]   Overall Financial Resource Strain (CARDIA)    Difficulty of Paying Living Expenses: Not hard at all  Food Insecurity: No Food Insecurity (12/13/2023)   Hunger Vital Sign    Worried About Running Out of Food in the Last Year: Never true    Ran Out of Food in the Last Year: Never true  Transportation Needs: No Transportation Needs (12/13/2023)   PRAPARE - Administrator, Civil Service (Medical): No    Lack of Transportation (Non-Medical): No  Physical Activity: Sufficiently Active (11/01/2023)   Received from [facility]   Exercise Vital Sign    Days of Exercise per Week: 4 days    Minutes of Exercise per Session: 40 min  Stress: Not on file  Social Connections: Moderately Isolated (12/13/2023)   Social Connection and Isolation Panel [NHANES]    Frequency of Communication with Friends and Family: More than three times a week    Frequency of Social Gatherings with Friends and Family: More than three times a week    Attends Religious Services: Never    Database administrator or Organizations: No    Attends Engineer, structural: Never    Marital  Status: Married   Family History  Problem Relation Age of Onset   Stomach cancer Mother    Heart attack Father 66   Stroke Father    Scheduled Meds:  cyanocobalamin  1,000 mcg Oral Daily   furosemide  40 mg Intravenous BID   mouth rinse  15 mL Mouth Rinse Q2H   Continuous Infusions:  heparin 950 Units/hr (12/14/23 0802)   PRN Meds:.acetaminophen **OR** acetaminophen, hydrALAZINE, LORazepam, melatonin, ondansetron **OR**  ondansetron (ZOFRAN) IV, mouth rinse Medications Prior to Admission:  Prior to Admission medications   Medication Sig Start Date End Date Taking? Authorizing Provider  Cholecalciferol (VITAMIN D-1000 MAX ST) 25 MCG (1000 UT) tablet Take 1,000 Units by mouth daily.   Yes [provider]  Cholecalciferol 50 MCG (2000 UT) TABS Take 2,000 Units by mouth daily. 09/10/23  Yes [provider]  cyanocobalamin (VITAMIN B12) 1000 MCG tablet Take 1,000 mcg by mouth daily. 08/24/23 08/23/24 Yes [provider]  ELIQUIS 5 MG TABS tablet Take 5 mg by mouth 2 (two) times daily. 12/02/22  Yes [provider]  furosemide (LASIX) 40 MG tablet Take 40 mg by mouth daily. 10/26/23  Yes [provider]  magnesium oxide (MAG-OX) 400 (240 Mg) MG tablet Take 2 tablets by mouth daily. 11/01/23  Yes [provider]  Melatonin 1 MG CHEW Chew 1 tablet by mouth daily as needed.   Yes [provider]  amiodarone (PACERONE) 200 MG tablet Take 200 mg by mouth 2 (two) times daily. Patient not taking: Reported on 12/13/2023 12/01/23   [provider]  bumetanide (BUMEX) 1 MG tablet Take by mouth. Patient not taking: Reported on 12/13/2023 09/10/23 09/09/24  [provider]  carvedilol (COREG) 3.125 MG tablet Take 3.125 mg by mouth in the morning and at bedtime. Patient not taking: Reported on 12/13/2023    [provider]  empagliflozin (JARDIANCE) 10 MG TABS tablet Take 1 tablet by mouth daily. Patient not taking: Reported on  12/13/2023 12/12/23 12/11/24  [provider]  ENTRESTO 24-26 MG Take 1 tablet by mouth 2 (two) times daily. Patient not taking: Reported on 12/13/2023 10/17/23   [provider]  eplerenone (INSPRA) 25 MG tablet Take 1 tablet by mouth daily. Patient not taking: Reported on 12/13/2023 10/18/23   [provider]  losartan (COZAAR) 100 MG tablet Take 100 mg by mouth daily. Patient not taking: Reported on 12/13/2023 08/02/23   [provider]  metoprolol succinate (TOPROL-XL) 25 MG 24 hr tablet Take 12.5 mg by mouth daily. Patient not taking: Reported on 12/13/2023 12/12/23 12/11/24  [provider]  omeprazole (PRILOSEC) 40 MG capsule Take 40 mg by mouth daily. Patient not taking: Reported on 12/13/2023    [provider]  predniSONE (DELTASONE) 5 MG tablet Take 1 tablet by mouth daily. Patient not taking: Reported on 12/13/2023    [provider]  spironolactone (ALDACTONE) 25 MG tablet Take by mouth. Patient not taking: Reported on 12/13/2023 07/08/23   [provider]  thiamine (VITAMIN B1) 100 MG tablet Take 100 mg by mouth daily. Patient not taking: Reported on 12/13/2023 09/10/23 09/09/24  [provider]  torsemide (DEMADEX) 20 MG tablet Take 20 mg by mouth daily. Patient not taking: Reported on 12/13/2023 07/08/23   [provider]  traMADol  (ULTRAM ) 50 MG tablet Take 1 tablet (50 mg total) by mouth 2 (two) times daily. Patient not taking: Reported on 12/13/2023 03/22/17   Menshew, Raye Cai, PA-C   Allergies  Allergen Reactions   Quinapril Swelling   Bumetanide Other (See Comments)   Spironolactone Other (See Comments)   Torsemide Other (See Comments)   Review of Systems  All other systems reviewed and are negative.   Physical Exam HENT:     Head: Normocephalic and atraumatic.  Pulmonary:     Effort: Pulmonary effort is normal.  Abdominal:     Tenderness: There is no abdominal tenderness. There is no  guarding.  Musculoskeletal:        General: No tenderness.  Skin:    General: Skin is warm and dry.  Neurological:     Mental Status: He is alert. He is disoriented.     Vital Signs: BP 117/80   Pulse 70   Temp 97.7 F (36.5 C) (Oral)   Resp (!) 21   Ht 5\' 9"  (1.753 m)   Wt 83.9 kg   SpO2 94%   BMI 27.32 kg/m  Pain Scale: (S) 0-10   Pain Score: (S) 10-Worst pain ever   SpO2: SpO2: 94 % O2 Device:SpO2: 94 % O2 Flow Rate: .O2 Flow Rate (L/min): 2 L/min  IO: Intake/output summary:  Intake/Output Summary (Last 24 hours) at 12/14/2023 1201 Last data filed at 12/13/2023 1700 Gross per 24 hour  Intake --  Output 775 ml  Net -775 ml    LBM:   Baseline Weight: Weight: 83.9 kg Most recent weight: Weight: 83.9 kg      Signed by: Meribeth Standard, NP   Please contact Palliative Medicine Team phone at [phone] for questions and concerns.  For individual provider: See Tilford Foley

## 2023-12-14 NOTE — Consult Note (Signed)
 PHARMACY - ANTICOAGULATION CONSULT NOTE  Pharmacy Consult for IV Heparin Indication: chest pain/ACS  Patient Measurements: 82.2 kg, 5 ft 9 in per [facility] records 12/12/23  Labs: Recent Labs    12/13/23 1344 12/13/23 1547 12/13/23 1846 12/14/23 0451  HGB 13.6  --   --  12.1*  HCT 42.6  --   --  36.4*  PLT 207  --   --  189  APTT  --   --  33 103*  LABPROT 17.0*  --   --   --   INR 1.4*  --   --   --   HEPARINUNFRC  --   --  >1.10*  --   CREATININE 0.92  --   --   --   TROPONINIHS 322* 405*  --   --     Estimated Creatinine Clearance: 61.9 mL/min (by C-G formula based on SCr of 0.92 mg/dL).   Medical History: History reviewed. No pertinent past medical history.  Medications:  Apixaban 5 mg BID, last dose reported 12/13/23 AM, exact time not reported  Assessment: Patient arriving to ED 12/13/23 PM with unresponsiveness. 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 Further history of HFrEF, HTN, Afib on apixaban, CAD. Pharmacy consulted to initiate and manage heparin infusion for suspected ACS.  Baseline CBC within normal limits. Baseline aPTT, heparin level, INR are pending  Goal of Therapy:  Heparin level 0.3-0.7 units/ml aPTT 66 - 102 seconds Monitor platelets by anticoagulation protocol: Yes  05/13 0451 aPTT 103, slightly supratherapeutic   Plan:  --Decrease heparin infusion to 950 units/hr --Recheck aPTT 8 hours after rate change. Follow aPTT until correlation established with heparin level --Daily CBC per protocol while on IV heparin  Coretta Dexter, PharmD, [facility] 12/14/2023 6:53 AM

## 2023-12-14 NOTE — Progress Notes (Signed)
 Eeg done

## 2023-12-14 NOTE — Progress Notes (Addendum)
 NEUROLOGY CONSULT FOLLOW UP NOTE   Date of service: Dec 14, 2023 Patient Name: Bob Ortiz MRN:  [id number] DOB:  1940/10/12  Interval Hx/subjective  New history revealed by chart notes from [facility] and [facility] which document waning/waning mental status as well as hallucinations since last year that were described as likely related to delirium and metabolic encephalopathy related to heart failure, although a neuro degenerative disease was also to be considered.  Since then he has had other assessments for recurring hallucinations, including this year.    Per SLP evaluation today, he behaved as if he had dementia, picking at covers/leads/cords, chuckling, low volume of mumbled speech and formed visual hallucination evidenced by the patient stating that "there is a man back there" while pointing to behind his bed.    Daughter states he is sharp at home but hard of hearing. She made no mention to me of any delirium episodes other than in 2019 after starting steroids for what she states was a diagnosis of Addison's disease at [facility]. I asked specifically about other episodes and she did not say anything about the 2024 and 2025 visits described above.   Vitals   Vitals:   12/14/23 1415 12/14/23 1430 12/14/23 1600 12/14/23 1741  BP: 111/69 130/81 106/68 108/72  Pulse:    72  Resp:    16  Temp:    97.6 F (36.4 C)  TempSrc:      SpO2:    100%  Weight:      Height:         Body mass index is 27.32 kg/m.  Physical Exam   Physical Exam HEENT- [place]/AT. No neck stiffness Lungs- Respirations unlabored Extremities- Warm and well-perfused  Neurological Examination Mental Status: Awake and alert. Oriented to the city and state, but not to the year, month, day of the week or the fact that he is in a hospital (endorsed being in a nursing facility). He is abulic but will respond to questions. At times with stuttering, hypophonic speech that is unclear, mostly occurring when he is asked more difficult  orientation questions. Able to follow simple commands without difficulty. Did not exhibit behaviors consistent with hallucinations at the time of my exam. Pleasant and cooperative with no agitation. States that the current president is Sports administrator. Daughter notes that his current performance on memory tasks and orientation during bedside exam is significantly off of his baseline.  Cranial Nerves: II: Fixates and tracks examiner and makes eye contact. PERRL. III,IV, VI: No ptosis. EOMI. No nystagmus. V: Temp sensation subjectively equal bilaterally VII: Smile symmetric VIII: HOH IX,X: Hypophonic speech is noted XI: Symmetric XII: Midline tongue extension Motor: RUE: 5/5 LUE: 5/5 hand grip (has restrictions on upper arm movement due to recent pacemaker placement) BLE: Wiggles toes equally. Resists with knee extension 5/5. Sensory: Intact to touch x 4.  Cerebellar: No ataxia with FNF on the right (movement restrictions on the left)  Gait: Deferred   Medications  Current Facility-Administered Medications:    acetaminophen (TYLENOL) tablet 650 mg, 650 mg, Oral, Q6H PRN, 650 mg at 12/14/23 1309 **OR** acetaminophen (TYLENOL) suppository 650 mg, 650 mg, Rectal, Q6H PRN, Cox, Amy N, DO   cyanocobalamin (VITAMIN B12) tablet 1,000 mcg, 1,000 mcg, Oral, Daily, Cox, Amy N, DO, 1,000 mcg at 12/14/23 1051   furosemide (LASIX) injection 40 mg, 40 mg, Intravenous, BID, Cox, Amy N, DO, 40 mg at 12/14/23 1051   haloperidol lactate (HALDOL) injection 1 mg, 1 mg, Intravenous, Q6H PRN, Mason Sole,  Vipul, MD   hydrALAZINE (APRESOLINE) injection 5 mg, 5 mg, Intravenous, Q6H PRN, Cox, Amy N, DO   LORazepam (ATIVAN) injection 0.5 mg, 0.5 mg, Intravenous, Q6H PRN, Cox, Amy N, DO   melatonin tablet 5 mg, 5 mg, Oral, QHS PRN, Cox, Amy N, DO   ondansetron (ZOFRAN) tablet 4 mg, 4 mg, Oral, Q6H PRN **OR** ondansetron (ZOFRAN) injection 4 mg, 4 mg, Intravenous, Q6H PRN, Cox, Amy N, DO   Oral care mouth rinse, 15 mL, Mouth  Rinse, Q2H, Cox, Amy N, DO   Oral care mouth rinse, 15 mL, Mouth Rinse, PRN, Cox, Amy N, DO   risperiDONE (RISPERDAL M-TABS) disintegrating tablet 0.5 mg, 0.5 mg, Oral, QHS, Brenna Cam, MD  Labs and Diagnostic Imaging   CBC:  Recent Labs  Lab 12/13/23 1344 12/14/23 0451  WBC 9.2 6.9  NEUTROABS 6.8  --   HGB 13.6 12.1*  HCT 42.6 36.4*  MCV 100.0 94.8  PLT 207 189    Basic Metabolic Panel:  Lab Results  Component Value Date   NA 130 (L) 12/14/2023   K 3.5 12/14/2023   CO2 22 12/14/2023   GLUCOSE 64 (L) 12/14/2023   BUN 10 12/14/2023   CREATININE 0.74 12/14/2023   CALCIUM 7.4 (L) 12/14/2023   GFRNONAA >60 12/14/2023   Lipid Panel: No results found for: "LDLCALC" HgbA1c: No results found for: "HGBA1C" Urine Drug Screen: No results found for: "LABOPIA", "COCAINSCRNUR", "LABBENZ", "AMPHETMU", "THCU", "LABBARB"  Alcohol Level No results found for: "ETH" INR  Lab Results  Component Value Date   INR 1.4 (H) 12/13/2023   APTT  Lab Results  Component Value Date   APTT 82 (H) 12/14/2023     Assessment  Bob Ortiz is a 83 y.o. male presenting to the ED with new-onset seizure activity.  - Exam reveals an abulic patient who continues to be poorly oriented and has difficulty following detailed commands. He is more awake today, but disoriented. No agitation noted. No focal weakness noted.  - EEG is within normal limits. - CT head: No evidence of an acute intracranial abnormality. Parenchymal atrophy and chronic small vessel ischemic disease. - Ultram  was discontinued yesterday - Labs: - Na mildly low at 130. Ca low at 7.4 down from 8.4 yesterday. Low albumin also noted. Mg normal.  - U/A unremarkable from an infectious standpoint.  - Glucose was low today on CMP - BNP and troponin elevated - Low EF of 20% on recent prior echocardiogram.  - Impression:  - DDx for his seizure activity includes new onset epileptic seizure versus syncopal convulsion versus seizure secondary  to transient hypoxia and/or brain hypoperfusion in the context of his CHF with severely reduced EF.   - He may indeed have a dementia with waxing-waning levels of cognition which would point more to Lewy body dementia given the hallucinations. Will need outpatient dementia evaluation.     Recommendations  - Lorazepam 0.5 mg IV every 6 hours as needed for seizure  - Inpatient seizure precautions - Frequent neuro checks - Optimization of cardiac function, fluid status and BP - Orthostatics when able - Given no electrographic abnormality on EEG suggesting epileptic seizure tendency, there is no indication for an anticonvulsant for a first-time seizure.  - Continue to monitor for possible improvement - PT/OT/Speech - Will need outpatient demential evaluation for possible Lewy body dementia.  - May need to switch Lasix to a non-loop diuretic as these can cause delirium ______________________________________________________________________   Hope Ly, Addy Mcmannis, MD [facility]

## 2023-12-14 NOTE — Consult Note (Signed)
 [facility] CLINIC CARDIOLOGY CONSULT NOTE       Bob Ortiz ID: Bob Ortiz MRN: [id number] DOB/AGE: 08/21/1940 83 y.o.  Admit date: 12/13/2023 Referring Physician Dr. Seleta Dakins Primary Physician Alena Hush, MD Primary Cardiologist Dr. Beau Bound Reason for Consultation Elevated Troponins   HPI: Bob CLAXTON is a 83 y.o. male  with a past medical history of chronic HFrEF, nonischemic cardiomyopathy LBBB s/p CRT-P ([employer] X4 CRTP -12/08/23), hypertension, coronary artery disease, paroxsymal atrial fibrillation (Eliquis), type 2 diabetes mellitus who presented to the ED on 12/13/2023 for concern for seizure like activity. Bob Ortiz had biventricular pacemaker placement on 12/08/2023. Cardiology was consulted for further evaluation.   Bob Ortiz presented to the ED via EMS after patients daughter found him seizing, cyanotic and drooling. Daughter performed 10 minutes of chest compressions prior to EMS arrival. When EMS arrived Bob Ortiz had pulse and Bob Ortiz was talking and following commands. Bob Ortiz denies any chest pain or SOB. Work up in the ED notable for ABG 7.46 pH, 43 pCO2, 30.6 bicarb. Na 130, K 3.6, Mg 2.1, Cr 0.92, Hgb 13.6, plts 207. Elevated LFTs. CTA with no evidence of PE and reveals bilateral pleural effusions and coronary artery calcification. CT head no evidence of acute intracranial abnormality. BNP elevated 780. Trops elevated and trended 322 > 405. EKG in ED with V-paced, rate 70 bpm. Bob Ortiz received 1x IV bumex 1 mg and started on heparin gtt.   At the time of my evaluation this afternoon, Bob Ortiz was resting comfortably in hospital bed in no acute distress with oldest daughter at bedside. Discussed symptoms in further detail with Bob Ortiz. Bob Ortiz denies any cardiac chest pain or SOB. Bob Ortiz states he has mild chest discomfort with palpation and deep breathing s/p chest compressions. Investigated patients CRT-P at bedside with boston scientific device with evidence of arrhythmias or  significant events found.   Review of systems complete and found to be negative unless listed above    History reviewed. No pertinent past medical history.  Past Surgical History:  Procedure Laterality Date   FRACTURE SURGERY Right    shoulder    (Not in a hospital admission)  Social History   Socioeconomic History   Marital status: Married    Spouse name: Not on file   Number of children: Not on file   Years of education: Not on file   Highest education level: Not on file  Occupational History   Not on file  Tobacco Use   Smoking status: Former    Current packs/day: 0.00    Types: Cigarettes    Quit date: 39    Years since quitting: 50.3   Smokeless tobacco: Never  Vaping Use   Vaping status: Never Used  Substance and Sexual Activity   Alcohol use: Yes    Comment: occassionally   Drug use: Never   Sexual activity: Not on file  Other Topics Concern   Not on file  Social History Narrative   Not on file   Social Drivers of Health   Financial Resource Strain: Low Risk  (12/04/2023)   Received from [facility]   Overall Financial Resource Strain (CARDIA)    Difficulty of Paying Living Expenses: Not hard at all  Food Insecurity: No Food Insecurity (12/13/2023)   Hunger Vital Sign    Worried About Running Out of Food in the Last Year: Never true    Ran Out of Food in the Last Year: Never true  Transportation Needs: No Transportation Needs (12/13/2023)  PRAPARE - Administrator, Civil Service (Medical): No    Lack of Transportation (Non-Medical): No  Physical Activity: Sufficiently Active (11/01/2023)   Received from [facility]   Exercise Vital Sign    Days of Exercise per Week: 4 days    Minutes of Exercise per Session: 40 min  Stress: Not on file  Social Connections: Moderately Isolated (12/13/2023)   Social Connection and Isolation Panel [NHANES]    Frequency of Communication with Friends and Family: More than  three times a week    Frequency of Social Gatherings with Friends and Family: More than three times a week    Attends Religious Services: Never    Database administrator or Organizations: No    Attends Banker Meetings: Never    Marital Status: Married  Catering manager Violence: Bob Ortiz Unable To Answer (12/13/2023)   Humiliation, Afraid, Rape, and Kick questionnaire    Fear of Current or Ex-Partner: Bob Ortiz unable to answer    Emotionally Abused: Bob Ortiz unable to answer    Physically Abused: Bob Ortiz unable to answer    Sexually Abused: Bob Ortiz unable to answer    Family History  Problem Relation Age of Onset   Stomach cancer Mother    Heart attack Father 43   Stroke Father      Vitals:   12/14/23 0830 12/14/23 0900 12/14/23 0930 12/14/23 1000  BP: 121/75 133/78 123/77 117/80  Pulse: 70 70 70 70  Resp: (!) 22 20 20  (!) 21  Temp:      TempSrc:      SpO2: 94% 94% 91% 94%  Weight:      Height:        PHYSICAL EXAM General: chronically ill appearing elderly male, well nourished, in no acute distress. HEENT: Normocephalic and atraumatic. Neck: No JVD.   Lungs: Normal respiratory effort on room air. Clear bilaterally to auscultation. No wheezes, crackles, rhonchi.  Heart: HRRR, paced. Normal S1 and S2 without gallops or murmurs.  Abdomen: Non-distended appearing.  Msk: Normal strength and tone for age. Extremities: Warm and well perfused. No clubbing, cyanosis. No edema.  Neuro: Alert and oriented X 3. Psych: Answers questions appropriately.   Labs: Basic Metabolic Panel: Recent Labs    12/13/23 1344 12/14/23 0451  NA 130* 130*  K 3.6 3.5  CL 96* 101  CO2 23 22  GLUCOSE 100* 64*  BUN 13 10  CREATININE 0.92 0.74  CALCIUM 8.4* 7.4*  MG 2.1 2.0  PHOS 3.9  --    Liver Function Tests: Recent Labs    12/13/23 1344 12/14/23 0451  AST 105* 69*  ALT 61* 47*  ALKPHOS 131* 105  BILITOT 0.9 1.1  PROT 6.4* 5.4*  ALBUMIN 3.1* 2.7*   No results for  input(s): "LIPASE", "AMYLASE" in the last 72 hours. CBC: Recent Labs    12/13/23 1344 12/14/23 0451  WBC 9.2 6.9  NEUTROABS 6.8  --   HGB 13.6 12.1*  HCT 42.6 36.4*  MCV 100.0 94.8  PLT 207 189   Cardiac Enzymes: Recent Labs    12/13/23 1344 12/13/23 1547  TROPONINIHS 322* 405*   BNP: Recent Labs    12/13/23 1344  BNP 780.7*   D-Dimer: No results for input(s): "DDIMER" in the last 72 hours. Hemoglobin A1C: No results for input(s): "HGBA1C" in the last 72 hours. Fasting Lipid Panel: No results for input(s): "CHOL", "HDL", "LDLCALC", "TRIG", "CHOLHDL", "LDLDIRECT" in the last 72 hours. Thyroid Function Tests: Recent  Labs    12/14/23 0451  TSH 4.346   Anemia Panel: No results for input(s): "VITAMINB12", "FOLATE", "FERRITIN", "TIBC", "IRON", "RETICCTPCT" in the last 72 hours.   Radiology: US  ABDOMEN LIMITED RUQ (LIVER/GB) Result Date: 12/13/2023 CLINICAL DATA:  Transaminitis EXAM: ULTRASOUND ABDOMEN LIMITED RIGHT UPPER QUADRANT COMPARISON:  Chest x-ray and chest CTA earlier 12/13/2023 FINDINGS: Gallbladder: Previous cholecystectomy. Common bile duct: Diameter: 2 mm Liver: No focal lesion identified. Within normal limits in parenchymal echogenicity. Portal vein is patent on color Doppler imaging with normal direction of blood flow towards the liver. Other: Right pleural effusion. Please correlate with prior x-ray and CTA. IMPRESSION: Previous cholecystectomy.  No ductal dilatation. Electronically Signed   By: Adrianna Horde M.D.   On: 12/13/2023 16:39   CT Head Wo Contrast Result Date: 12/13/2023 CLINICAL DATA:  Provided history: Mental status change, unknown cause. EXAM: CT HEAD WITHOUT CONTRAST TECHNIQUE: Contiguous axial images were obtained from the base of the skull through the vertex without intravenous contrast. RADIATION DOSE REDUCTION: This exam was performed according to the departmental dose-optimization program which includes automated exposure control, adjustment of  the mA and/or kV according to Bob Ortiz size and/or use of iterative reconstruction technique. COMPARISON:  Head CT 09/01/2023. FINDINGS: Brain: Generalized cerebral atrophy. Patchy and ill-defined hypoattenuation within the cerebral white matter, nonspecific but compatible with mild chronic small vessel ischemic disease. There is no acute intracranial hemorrhage. No demarcated cortical infarct. No extra-axial fluid collection. No evidence of an intracranial mass. No midline shift. Vascular: No hyperdense vessel.  Atherosclerotic calcifications. Skull: No calvarial fracture or aggressive osseous lesion. Visible sinuses/orbits: No mass or acute finding within the imaged orbits. Minimal mucosal thickening within the right ethmoid and right sphenoid sinuses. Other: Trace left mastoid effusion. IMPRESSION: 1. No evidence of an acute intracranial abnormality. 2. Parenchymal atrophy and chronic small vessel ischemic disease. 3. Minor paranasal sinus mucosal thickening at the imaged levels. 4. Trace left mastoid effusion. Electronically Signed   By: Bascom Lily D.O.   On: 12/13/2023 15:36   DG Chest 1 View Result Date: 12/13/2023 CLINICAL DATA:  Unresponsiveness EXAM: CHEST  1 VIEW COMPARISON:  March 22, 2017 FINDINGS: No change in the left subclavian bipolar ICDF pacemaker device tip of the leads in good position no evidence of discontinuity Bilateral interstitial prominence and small left effusion could correlate with congestive changes. Heart upper limits of normal. No pneumothorax. IMPRESSION: Congestive changes. Electronically Signed   By: Fredrich Jefferson M.D.   On: 12/13/2023 15:14   CT Angio Chest PE W/Cm &/Or Wo Cm Result Date: 12/13/2023 CLINICAL DATA:  Pulmonary embolism EXAM: CT ANGIOGRAPHY CHEST WITH CONTRAST TECHNIQUE: Multidetector CT imaging of the chest was performed using the standard protocol during bolus administration of intravenous contrast. Multiplanar CT image reconstructions and MIPs were  obtained to evaluate the vascular anatomy. RADIATION DOSE REDUCTION: This exam was performed according to the departmental dose-optimization program which includes automated exposure control, adjustment of the mA and/or kV according to Bob Ortiz size and/or use of iterative reconstruction technique. CONTRAST:  75mL OMNIPAQUE IOHEXOL 350 MG/ML SOLN COMPARISON:  None Available. FINDINGS: Cardiovascular: Satisfactory opacification of the pulmonary arteries to the segmental level. No evidence of pulmonary embolism. Normal heart size. No pericardial effusion. Coronary artery calcifications, small to medium-sized pericardial effusion Mediastinum/Nodes: No enlarged mediastinal, hilar, or axillary lymph nodes. Thyroid gland, trachea, and esophagus demonstrate no significant findings. Lungs/Pleura: Bilateral pleural effusions, left larger than right with basilar atelectasis. Interstitial prominence with ground-glass appearance could correlate with congestive  changes versus superimposed pneumonitis. No evidence of pulmonary embolism. No aortic dissection on this limited enhanced pulmonary CT angiogram images Upper Abdomen: No acute abnormality. Musculoskeletal: No chest wall abnormality. No acute or significant osseous findings. Review of the MIP images confirms the above findings. IMPRESSION: *No evidence of pulmonary embolism. *Bilateral pleural effusions, left larger than right with basilar atelectasis. *Interstitial prominence with ground-glass appearance could correlate with congestive changes versus superimposed pneumonitis. *Coronary artery calcifications, small to medium-sized pericardial effusion. Electronically Signed   By: Fredrich Jefferson M.D.   On: 12/13/2023 15:09    ECHO ordered  TELEMETRY reviewed by me 12/14/2023: V-paced, rate 70s  EKG reviewed by me: V-paced, rate 70 bpm  Data reviewed by me 12/14/2023: last 24h vitals tele labs imaging I/O ED provider note, admission H&P.  Principal Problem:   Seizure  (HCC) Active Problems:   Bilateral pleural effusion   Elevated troponin   Atrial fibrillation, chronic (HCC)   Essential hypertension   Transaminitis   NICM (nonischemic cardiomyopathy) (HCC)    ASSESSMENT AND PLAN:   Bob Ortiz is a 83 y.o. male  with a past medical history of chronic HFrEF, nonischemic cardiomyopathy LBBB s/p CRT-P ([employer] X4 CRTP -12/08/23), hypertension, coronary artery disease, paroxsymal atrial fibrillation (Eliquis), type 2 diabetes mellitus who presented to the ED on 12/13/2023 for concern for seizure like activity. Bob Ortiz had biventricular pacemaker placement on 12/08/2023. Cardiology was consulted for further evaluation.   # Seizure like activity # Nonischemic cardiomyopathy, LBBB s/p CRT-P (12/08/23) # Paroxsymal atrial fibrillation  # Hypertension # Demand Ischemia # Acute on chronic HFrEF Bob Ortiz with recent CRT-P placement on 05/07 reports to ED via EMS due to having seizure-like activity, chest compressions initiated by daughter at home. EMS arrived and Bob Ortiz had pulse and responsive. BNP elevated 780. Trops elevated and trended 322 > 405. CXR and CT reveals bilateral pleural effusions. EKG in ED with V-paced, rate 70 bpm. Investigated patients CRT-P at bedside with boston scientific device with evidence of arrhthymias or significant events found. Of note Bob Ortiz has BP and HR stable -Echo ordered -Continue Heparin gtt.  -Continue IV lasix 40 mg twice daily. [place] monitor renal function and UOP.         -Of note pt has document allergies to bumex, spironolactone, and torsemide. -Plan to optimize GDMT as BP and renal function allow. Will discuss with Bob Ortiz what medications he's able to tolerate. -Elevated troponins most consistent with demand/supply mismatch and not ACS.   This Bob Ortiz's plan of care was discussed and created with Dr. Custovic and she is in agreement.  Signed: Creighton Doffing, PA-C  12/14/2023, 2:38 PM [facility] [address]  Cardiology

## 2023-12-14 NOTE — ED Notes (Signed)
 Pt family member came to desk asking for assistance for patient to urinate.  Pt provided with urinal and assisted to the edge of the bed.  Unable to produce any urine at this time.  Pt assisted back into bed, monitoring equipment in place, and pt's daughter back to bedside.

## 2023-12-14 NOTE — Progress Notes (Signed)
 Patient pulled out IV trying to go to the bathroom, Bed alarm went on and this RN assisted with bleeding IV site and bathroom needs. Dr. Mason Sole made aware of patient's impulsive and safety risk concerns being on heparin gtt. Daughter confirmed that patient is more confused now than he was earlier today. Obtained orders for PRN medications. Confirmed that patient is not to be on tele and heparin gtt, Dc'd and that patient is to have a sitter. AC made aware of need of sitter.

## 2023-12-14 NOTE — Progress Notes (Signed)
 1      PROGRESS NOTE    Bob Ortiz  BMW:[id number] DOB: [birth year] DOA: 12/13/2023 PCP: Alena Hush, MD   Brief Narrative:   83 year old male with history of CAD with calcification, left bundle branch block, nonischemic cardiomyopathy, hypertension, chronic atrial fibrillation, on Eliquis, who presents ED for chief concerns of seizure-like activity.   5/13: Neurology, palliative care and cardiology consult   Assessment & Plan:   Principal Problem:   Seizure [facility]) Active Problems:   Elevated troponin   Bilateral pleural effusion   Atrial fibrillation, chronic (HCC)   Essential hypertension   Transaminitis   NICM (nonischemic cardiomyopathy) (HCC)  * Seizure (HCC) Acute metabolic encephalopathy Seizure, fall, aspiration precaution Neurology following Intermittent hallucination reported in past hospital visits at [facility] and [facility].  He is also having visual hallucination here reported by nursing -CT head showing no acute intracranial abnormality -EEG showed no epileptic waves - Continue lorazepam 0.5 mg IV every 6 hours as needed for seizure - Frequent neurochecks -No indication for AED at this time per neuro -unable to rule out neuro degenerative disease -No arrhythmia reported on CRT-P   Elevated troponin Due to supply/demand ischemia.  No MI   NICM (nonischemic cardiomyopathy) (HCC) Complete echo with contrast on 12/06/2023: Estimate ejection fraction is 20%, with grade 3 diastolic dysfunction Cardiology consult   Transaminitis Suspect secondary to acute on chronic heart failure with bilateral pleural effusion Treatment per above with diuresis Recheck LFTs in the a.m.   Essential hypertension Hydralazine 5 mg IV every 6 hours.  For SBP greater than 170   Bilateral pleural effusion Left > right. Cardio seen Furosemide 40 mg IV twice daily Strict I's and O's  GOC Overall poor prognosis. May have underlying neuro degenerative dz/dementia Palliative care  c/s   DVT prophylaxis: Eliquis Place TED hose Start: 12/13/23 1553     Code Status: Full Code Family Communication: d/w 2 daughters at bedside Disposition Plan: possible D/C in 2-3 days depending on clinical condition   Consultants:  Neuro Palliative care Cardio     Subjective:  Not interested in talking much, 2 daughters at bedside  Objective: Vitals:   12/14/23 1415 12/14/23 1430 12/14/23 1600 12/14/23 1741  BP: 111/69 130/81 106/68 108/72  Pulse:    72  Resp:    16  Temp:    97.6 F (36.4 C)  TempSrc:      SpO2:    100%  Weight:      Height:       No intake or output data in the 24 hours ending 12/14/23 1814 Filed Weights   12/13/23 1901  Weight: 83.9 kg    Examination:  General exam: Appears calm and comfortable  Respiratory system: Clear to auscultation. Respiratory effort normal. Cardiovascular system: S1 & S2 heard, RRR. No pedal edema. Gastrointestinal system: Abdomen is soft, benign Central nervous system: Alert and awake. No focal neurological deficits. Follows simple commands Extremities: Symmetric 5 x 5 power. Skin: No rashes, lesions or ulcers    Data Reviewed: I have personally reviewed following labs and imaging studies  CBC: Recent Labs  Lab 12/13/23 1344 12/14/23 0451  WBC 9.2 6.9  NEUTROABS 6.8  --   HGB 13.6 12.1*  HCT 42.6 36.4*  MCV 100.0 94.8  PLT 207 189   Basic Metabolic Panel: Recent Labs  Lab 12/13/23 1344 12/14/23 0451  NA 130* 130*  K 3.6 3.5  CL 96* 101  CO2 23 22  GLUCOSE 100* 64*  BUN 13 10  CREATININE 0.92 0.74  CALCIUM 8.4* 7.4*  MG 2.1 2.0  PHOS 3.9  --    GFR: Estimated Creatinine Clearance: 71.2 mL/min (by C-G formula based on SCr of 0.74 mg/dL). Liver Function Tests: Recent Labs  Lab 12/13/23 1344 12/14/23 0451  AST 105* 69*  ALT 61* 47*  ALKPHOS 131* 105  BILITOT 0.9 1.1  PROT 6.4* 5.4*  ALBUMIN 3.1* 2.7*   No results for input(s): "LIPASE", "AMYLASE" in the last 168 hours. No  results for input(s): "AMMONIA" in the last 168 hours. Coagulation Profile: Recent Labs  Lab 12/13/23 1344  INR 1.4*    Thyroid Function Tests: Recent Labs    12/14/23 0451  TSH 4.346  FREET4 0.82   Anemia Panel: No results for input(s): "VITAMINB12", "FOLATE", "FERRITIN", "TIBC", "IRON", "RETICCTPCT" in the last 72 hours. Sepsis Labs: Recent Labs  Lab 12/13/23 1617 12/13/23 1846  LATICACIDVEN 1.7 1.0    Recent Results (from the past 240 hours)  Culture, blood (routine x 2)     Status: None (Preliminary result)   Collection Time: 12/13/23  4:17 PM   Specimen: BLOOD  Result Value Ref Range Status   Specimen Description BLOOD BLOOD RIGHT FOREARM  Final   Special Requests   Final    BOTTLES DRAWN AEROBIC AND ANAEROBIC Blood Culture adequate volume   Culture   Final    NO GROWTH < 24 HOURS Performed at [facility], [address] [place], [place] [address]    Report Status PENDING  Incomplete  Culture, blood (routine x 2)     Status: None (Preliminary result)   Collection Time: 12/13/23  4:18 PM   Specimen: BLOOD  Result Value Ref Range Status   Specimen Description BLOOD BLOOD RIGHT FOREARM  Final   Special Requests   Final    BOTTLES DRAWN AEROBIC AND ANAEROBIC Blood Culture adequate volume   Culture   Final    NO GROWTH < 24 HOURS Performed at [facility], [address] [place], [place] [address]    Report Status PENDING  Incomplete         Radiology Studies: EEG adult Result Date: 12/14/2023 Arleene Lack, MD     12/14/2023  4:55 PM Patient Name: Bob Ortiz MRN: [id number] Epilepsy Attending: Arleene Lack Referring Physician/Provider: Kimberley Penman, MD Date: 12/14/2023 Duration: 26.25 mins Patient history: 83yo M with seizure like activity. EEG to evaluate for seizure Level of alertness: Awake AEDs during EEG study: None Technical aspects: This EEG study was done with scalp electrodes positioned according to the 10-20  International system of electrode placement. Electrical activity was reviewed with band pass filter of 1-70Hz , sensitivity of 7 uV/mm, display speed of 54mm/sec with a 60Hz  notched filter applied as appropriate. EEG data were recorded continuously and digitally stored.  Video monitoring was available and reviewed as appropriate. Description: The posterior dominant rhythm consists of 8 Hz activity of moderate voltage (25-35 uV) seen predominantly in posterior head regions, symmetric and reactive to eye opening and eye closing. Hyperventilation and photic stimulation were not performed.   IMPRESSION: This study is within normal limits. No seizures or epileptiform discharges were seen throughout the recording. A normal interictal EEG does not exclude the diagnosis of epilepsy. Priyanka O Yadav   US  ABDOMEN LIMITED RUQ (LIVER/GB) Result Date: 12/13/2023 CLINICAL DATA:  Transaminitis EXAM: ULTRASOUND ABDOMEN LIMITED RIGHT UPPER QUADRANT COMPARISON:  Chest x-ray and chest CTA earlier 12/13/2023 FINDINGS: Gallbladder: Previous cholecystectomy. Common  bile duct: Diameter: 2 mm Liver: No focal lesion identified. Within normal limits in parenchymal echogenicity. Portal vein is patent on color Doppler imaging with normal direction of blood flow towards the liver. Other: Right pleural effusion. Please correlate with prior x-ray and CTA. IMPRESSION: Previous cholecystectomy.  No ductal dilatation. Electronically Signed   By: Adrianna Horde M.D.   On: 12/13/2023 16:39   CT Head Wo Contrast Result Date: 12/13/2023 CLINICAL DATA:  Provided history: Mental status change, unknown cause. EXAM: CT HEAD WITHOUT CONTRAST TECHNIQUE: Contiguous axial images were obtained from the base of the skull through the vertex without intravenous contrast. RADIATION DOSE REDUCTION: This exam was performed according to the departmental dose-optimization program which includes automated exposure control, adjustment of the mA and/or kV according to  patient size and/or use of iterative reconstruction technique. COMPARISON:  Head CT 09/01/2023. FINDINGS: Brain: Generalized cerebral atrophy. Patchy and ill-defined hypoattenuation within the cerebral white matter, nonspecific but compatible with mild chronic small vessel ischemic disease. There is no acute intracranial hemorrhage. No demarcated cortical infarct. No extra-axial fluid collection. No evidence of an intracranial mass. No midline shift. Vascular: No hyperdense vessel.  Atherosclerotic calcifications. Skull: No calvarial fracture or aggressive osseous lesion. Visible sinuses/orbits: No mass or acute finding within the imaged orbits. Minimal mucosal thickening within the right ethmoid and right sphenoid sinuses. Other: Trace left mastoid effusion. IMPRESSION: 1. No evidence of an acute intracranial abnormality. 2. Parenchymal atrophy and chronic small vessel ischemic disease. 3. Minor paranasal sinus mucosal thickening at the imaged levels. 4. Trace left mastoid effusion. Electronically Signed   By: Bascom Lily D.O.   On: 12/13/2023 15:36   DG Chest 1 View Result Date: 12/13/2023 CLINICAL DATA:  Unresponsiveness EXAM: CHEST  1 VIEW COMPARISON:  March 22, 2017 FINDINGS: No change in the left subclavian bipolar ICDF pacemaker device tip of the leads in good position no evidence of discontinuity Bilateral interstitial prominence and small left effusion could correlate with congestive changes. Heart upper limits of normal. No pneumothorax. IMPRESSION: Congestive changes. Electronically Signed   By: Fredrich Jefferson M.D.   On: 12/13/2023 15:14   CT Angio Chest PE W/Cm &/Or Wo Cm Result Date: 12/13/2023 CLINICAL DATA:  Pulmonary embolism EXAM: CT ANGIOGRAPHY CHEST WITH CONTRAST TECHNIQUE: Multidetector CT imaging of the chest was performed using the standard protocol during bolus administration of intravenous contrast. Multiplanar CT image reconstructions and MIPs were obtained to evaluate the vascular  anatomy. RADIATION DOSE REDUCTION: This exam was performed according to the departmental dose-optimization program which includes automated exposure control, adjustment of the mA and/or kV according to patient size and/or use of iterative reconstruction technique. CONTRAST:  75mL OMNIPAQUE IOHEXOL 350 MG/ML SOLN COMPARISON:  None Available. FINDINGS: Cardiovascular: Satisfactory opacification of the pulmonary arteries to the segmental level. No evidence of pulmonary embolism. Normal heart size. No pericardial effusion. Coronary artery calcifications, small to medium-sized pericardial effusion Mediastinum/Nodes: No enlarged mediastinal, hilar, or axillary lymph nodes. Thyroid gland, trachea, and esophagus demonstrate no significant findings. Lungs/Pleura: Bilateral pleural effusions, left larger than right with basilar atelectasis. Interstitial prominence with ground-glass appearance could correlate with congestive changes versus superimposed pneumonitis. No evidence of pulmonary embolism. No aortic dissection on this limited enhanced pulmonary CT angiogram images Upper Abdomen: No acute abnormality. Musculoskeletal: No chest wall abnormality. No acute or significant osseous findings. Review of the MIP images confirms the above findings. IMPRESSION: *No evidence of pulmonary embolism. *Bilateral pleural effusions, left larger than right with basilar atelectasis. *Interstitial prominence with  ground-glass appearance could correlate with congestive changes versus superimposed pneumonitis. *Coronary artery calcifications, small to medium-sized pericardial effusion. Electronically Signed   By: Fredrich Jefferson M.D.   On: 12/13/2023 15:09        Scheduled Meds:  cyanocobalamin  1,000 mcg Oral Daily   furosemide  40 mg Intravenous BID   mouth rinse  15 mL Mouth Rinse Q2H   risperiDONE  0.5 mg Oral QHS   Continuous Infusions:   LOS: 1 day    Time spent: 35 mins    Chrisette Man Mason Sole, MD [facility] Pager  [phone]  If 7PM-7AM, please contact night-coverage [email or web address]  12/14/2023, 6:14 PM

## 2023-12-14 NOTE — Procedures (Signed)
 Patient Name: ABBA PORCHIA  MRN: [id number]  Epilepsy Attending: Arleene Lack  Referring Physician/Provider: Kimberley Penman, MD  Date: 12/14/2023 Duration: 26.25 mins  Patient history: 83yo M with seizure like activity. EEG to evaluate for seizure  Level of alertness: Awake  AEDs during EEG study: None  Technical aspects: This EEG study was done with scalp electrodes positioned according to the 10-20 International system of electrode placement. Electrical activity was reviewed with band pass filter of 1-70Hz , sensitivity of 7 uV/mm, display speed of 54mm/sec with a 60Hz  notched filter applied as appropriate. EEG data were recorded continuously and digitally stored.  Video monitoring was available and reviewed as appropriate.  Description: The posterior dominant rhythm consists of 8 Hz activity of moderate voltage (25-35 uV) seen predominantly in posterior head regions, symmetric and reactive to eye opening and eye closing. Hyperventilation and photic stimulation were not performed.     IMPRESSION: This study is within normal limits. No seizures or epileptiform discharges were seen throughout the recording.  A normal interictal EEG does not exclude the diagnosis of epilepsy.  Chelsey Kimberley O Britanny Marksberry

## 2023-12-14 NOTE — ED Notes (Signed)
 Pt up from bed to recliner, pt had removed brief. This tech placed new brief on pt. Pt back in bed resting comfortably, bed in low and locked position, call bell within reach.

## 2023-12-14 NOTE — Consult Note (Signed)
 PHARMACY - ANTICOAGULATION CONSULT NOTE  Pharmacy Consult for IV Heparin Indication: chest pain/ACS  Patient Measurements: 82.2 kg, 5 ft 9 in per [facility] records 12/12/23  Labs: Recent Labs    12/13/23 1344 12/13/23 1547 12/13/23 1846 12/14/23 0451 12/14/23 1500  HGB 13.6  --   --  12.1*  --   HCT 42.6  --   --  36.4*  --   PLT 207  --   --  189  --   APTT  --   --  33 103* 82*  LABPROT 17.0*  --   --   --   --   INR 1.4*  --   --   --   --   HEPARINUNFRC  --   --  >1.10* >1.10*  --   CREATININE 0.92  --   --  0.74  --   TROPONINIHS 322* 405*  --   --   --     Estimated Creatinine Clearance: 71.2 mL/min (by C-G formula based on SCr of 0.74 mg/dL).   Medical History: History reviewed. No pertinent past medical history.  Medications:  Apixaban 5 mg BID, last dose reported 12/13/23 AM, exact time not reported  Assessment: Patient arriving to ED 12/13/23 PM with unresponsiveness. 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 Further history of HFrEF, HTN, Afib on apixaban, CAD. Pharmacy consulted to initiate and manage heparin infusion for suspected ACS.  Baseline CBC within normal limits. Baseline aPTT, heparin level, INR are pending  Goal of Therapy:  Heparin level 0.3-0.7 units/ml aPTT 66 - 102 seconds Monitor platelets by anticoagulation protocol: Yes  05/13 0451 aPTT 103, slightly supratherapeutic 05/13 1500 aPTT 82, therapeutic x 1   Plan:  --Continue heparin infusion at 950 units/hr --Recheck aPTT in 8 hours. Follow aPTT until correlation established with heparin level --Daily CBC per protocol while on IV heparin  Will M. Alva Jewels, PharmD Clinical Pharmacist 12/14/2023 3:11 PM

## 2023-12-14 NOTE — ED Notes (Signed)
 Central monitoring noted a 5 beat run of V-Tach.  Alonza Arthurs, NP,  has acknowledged notification.

## 2023-12-15 DIAGNOSIS — I5023 Acute on chronic systolic (congestive) heart failure: Secondary | ICD-10-CM | POA: Diagnosis not present

## 2023-12-15 DIAGNOSIS — R7401 Elevation of levels of liver transaminase levels: Secondary | ICD-10-CM

## 2023-12-15 DIAGNOSIS — R0902 Hypoxemia: Secondary | ICD-10-CM | POA: Diagnosis not present

## 2023-12-15 DIAGNOSIS — I482 Chronic atrial fibrillation, unspecified: Secondary | ICD-10-CM | POA: Diagnosis not present

## 2023-12-15 DIAGNOSIS — Z7189 Other specified counseling: Secondary | ICD-10-CM | POA: Diagnosis not present

## 2023-12-15 DIAGNOSIS — J90 Pleural effusion, not elsewhere classified: Secondary | ICD-10-CM | POA: Diagnosis not present

## 2023-12-15 DIAGNOSIS — I428 Other cardiomyopathies: Secondary | ICD-10-CM

## 2023-12-15 DIAGNOSIS — I10 Essential (primary) hypertension: Secondary | ICD-10-CM

## 2023-12-15 DIAGNOSIS — R7989 Other specified abnormal findings of blood chemistry: Secondary | ICD-10-CM | POA: Diagnosis not present

## 2023-12-15 DIAGNOSIS — R569 Unspecified convulsions: Secondary | ICD-10-CM | POA: Diagnosis not present

## 2023-12-15 LAB — COMPREHENSIVE METABOLIC PANEL WITH GFR
ALT: 49 U/L — ABNORMAL HIGH (ref 0–44)
AST: 57 U/L — ABNORMAL HIGH (ref 15–41)
Albumin: 3.1 g/dL — ABNORMAL LOW (ref 3.5–5.0)
Alkaline Phosphatase: 116 U/L (ref 38–126)
Anion gap: 8 (ref 5–15)
BUN: 13 mg/dL (ref 8–23)
CO2: 29 mmol/L (ref 22–32)
Calcium: 8.6 mg/dL — ABNORMAL LOW (ref 8.9–10.3)
Chloride: 97 mmol/L — ABNORMAL LOW (ref 98–111)
Creatinine, Ser: 0.9 mg/dL (ref 0.61–1.24)
GFR, Estimated: >60 mL/min (ref >60)
Glucose, Bld: 85 mg/dL (ref 70–99)
Potassium: 3.4 mmol/L — ABNORMAL LOW (ref 3.5–5.1)
Sodium: 134 mmol/L — ABNORMAL LOW (ref 135–145)
Total Bilirubin: 1.1 mg/dL (ref 0.0–1.2)
Total Protein: 6.2 g/dL — ABNORMAL LOW (ref 6.5–8.1)

## 2023-12-15 LAB — CBC
HCT: 35.8 % — ABNORMAL LOW (ref 39.0–52.0)
Hemoglobin: 12.2 g/dL — ABNORMAL LOW (ref 13.0–17.0)
MCH: 31.1 pg (ref 26.0–34.0)
MCHC: 34.1 g/dL (ref 30.0–36.0)
MCV: 91.3 fL (ref 80.0–100.0)
Platelets: 184 10*3/uL (ref 150–400)
RBC: 3.92 MIL/uL — ABNORMAL LOW (ref 4.22–5.81)
RDW: 14 % (ref 11.5–15.5)
WBC: 7.6 10*3/uL (ref 4.0–10.5)
nRBC: 0 % (ref 0.0–0.2)

## 2023-12-15 LAB — MAGNESIUM: Magnesium: 2 mg/dL (ref 1.7–2.4)

## 2023-12-15 LAB — ECHOCARDIOGRAM COMPLETE
Area-P 1/2: 4.49 cm2
Est EF: <20
Height: 69 in
MV M vel: 4.63 m/s
MV Peak grad: 85.7 mmHg
S' Lateral: 5.5 cm
Weight: 2960 [oz_av]

## 2023-12-15 MED ORDER — POTASSIUM CHLORIDE CRYS ER 20 MEQ PO TBCR
40.0000 meq | EXTENDED_RELEASE_TABLET | Freq: Once | ORAL | Status: AC
  Administered 2023-12-15: 40 meq via ORAL
  Filled 2023-12-15: qty 2

## 2023-12-15 MED ORDER — FUROSEMIDE 10 MG/ML IJ SOLN
40.0000 mg | Freq: Two times a day (BID) | INTRAMUSCULAR | Status: DC
  Administered 2023-12-15: 40 mg via INTRAVENOUS
  Filled 2023-12-15: qty 4

## 2023-12-15 MED ORDER — FUROSEMIDE 10 MG/ML IJ SOLN
40.0000 mg | Freq: Two times a day (BID) | INTRAMUSCULAR | Status: AC
  Administered 2023-12-15: 40 mg via INTRAVENOUS
  Filled 2023-12-15: qty 4

## 2023-12-15 MED ORDER — POTASSIUM CHLORIDE CRYS ER 20 MEQ PO TBCR: 20.0000 meq | EXTENDED_RELEASE_TABLET | Freq: Once | ORAL | Status: DC

## 2023-12-15 NOTE — Progress Notes (Signed)
 1      PROGRESS NOTE    Bob Ortiz  ZOX:[id number] DOB: September 29, 1940 DOA: 12/13/2023 PCP: Alena Hush, MD   Brief Narrative:   83 year old male with history of CAD with calcification, left bundle branch block, nonischemic cardiomyopathy, hypertension, chronic atrial fibrillation, on Eliquis, who presents ED for chief concerns of seizure-like activity.   5/13: Neurology, palliative care and cardiology consult  5/14: Per neurology concern of Lewy body dementia and patient need outpatient evaluation for final diagnosis.  No need for any antiseizure medications as he might be having spontaneous movements due to transient brain hypoxia with very low EF or a syncopal convulsion. PT is recommending SNF  Assessment & Plan:   Principal Problem:   Seizure (HCC) Active Problems:   Elevated troponin   Bilateral pleural effusion   Atrial fibrillation, chronic (HCC)   Essential hypertension   Transaminitis   NICM (nonischemic cardiomyopathy) (HCC)  * Seizure (HCC) Acute metabolic encephalopathy Seizure, fall, aspiration precaution Neurology following Intermittent hallucination reported in past hospital visits at [facility] and [facility].  He is also having visual hallucination here reported by nursing -CT head showing no acute intracranial abnormality -EEG showed no epileptic waves - Continue lorazepam 0.5 mg IV every 6 hours as needed for seizure - Frequent neurochecks -No indication for AED at this time per neuro -unable to rule out neuro degenerative disease -No arrhythmia reported on CRT-P -Outpatient neurology follow-up for evaluation of concerning Lewy body dementia   Elevated troponin Due to supply/demand ischemia.  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  For SBP greater than 170   Bilateral pleural effusion Left > right. Cardio seen Furosemide 40 mg IV twice daily-will be transition to thiazide diuretic from tomorrow Strict I's and O's  GOC Overall poor prognosis. May have underlying neuro degenerative dz/dementia Palliative care c/s-family would like to keep him full code for now   DVT prophylaxis: Eliquis Place TED hose Start: 12/13/23 1553 apixaban (ELIQUIS) tablet 5 mg     Code Status: Full Code Family Communication: Discussed with daughter and wife at bedside Disposition Plan: possible D/C in 2-3 days depending on clinical condition-needs SNF placement   Consultants:  Neuro Palliative care Cardio  Subjective: Hard of hearing gentleman with no new concern.  Oriented to self and family only.  No current hallucinations but he was hallucinating earlier per daughter.  Objective: Vitals:   12/14/23 1741 12/14/23 2226 12/15/23 0715 12/15/23 1609  BP: 108/72 (!) 112/59 114/70 114/68  Pulse: 72 82 71 70  Resp: 16  16 16   Temp: 97.6 F (36.4 C) 97.8 F (36.6 C) 98 F (36.7 C) 97.6 F (36.4 C)  TempSrc:   Oral Oral  SpO2: 100% 99% 97% 98%  Weight:   77.9 kg   Height:   5\' 9"  (1.753 m)     Intake/Output Summary (Last 24 hours) at 12/15/2023 1631 Last data filed at 12/15/2023 1208 Gross per 24 hour  Intake 251.14 ml  Output 950 ml  Net -698.86 ml   Filed Weights   12/13/23 1901  12/15/23 0715  Weight: 83.9 kg 77.9 kg    Examination:  General.  Frail and hard of hearing elderly man, in no acute distress. Pulmonary.  Lungs clear bilaterally, normal respiratory effort. CV.  Regular rate and rhythm, no JVD, rub or murmur. Abdomen.  Soft, nontender,  nondistended, BS positive. CNS.  Alert and oriented to self and family only.  No focal neurologic deficit. Extremities.  No edema, no cyanosis, pulses intact and symmetrical.  Data Reviewed: I have personally reviewed following labs and imaging studies  CBC: Recent Labs  Lab 12/13/23 1344 12/14/23 0451 12/15/23 0543  WBC 9.2 6.9 7.6  NEUTROABS 6.8  --   --   HGB 13.6 12.1* 12.2*  HCT 42.6 36.4* 35.8*  MCV 100.0 94.8 91.3  PLT 207 189 184   Basic Metabolic Panel: Recent Labs  Lab 12/13/23 1344 12/14/23 0451 12/15/23 0543  NA 130* 130* 134*  K 3.6 3.5 3.4*  CL 96* 101 97*  CO2 23 22 29   GLUCOSE 100* 64* 85  BUN 13 10 13   CREATININE 0.92 0.74 0.90  CALCIUM 8.4* 7.4* 8.6*  MG 2.1 2.0 2.0  PHOS 3.9  --   --    GFR: Estimated Creatinine Clearance: 63.3 mL/min (by C-G formula based on SCr of 0.9 mg/dL). Liver Function Tests: Recent Labs  Lab 12/13/23 1344 12/14/23 0451 12/15/23 0543  AST 105* 69* 57*  ALT 61* 47* 49*  ALKPHOS 131* 105 116  BILITOT 0.9 1.1 1.1  PROT 6.4* 5.4* 6.2*  ALBUMIN 3.1* 2.7* 3.1*   No results for input(s): "LIPASE", "AMYLASE" in the last 168 hours. No results for input(s): "AMMONIA" in the last 168 hours. Coagulation Profile: Recent Labs  Lab 12/13/23 1344  INR 1.4*    Thyroid Function Tests: Recent Labs    12/14/23 0451  TSH 4.346  FREET4 0.82   Anemia Panel: No results for input(s): "VITAMINB12", "FOLATE", "FERRITIN", "TIBC", "IRON", "RETICCTPCT" in the last 72 hours. Sepsis Labs: Recent Labs  Lab 12/13/23 1617 12/13/23 1846  LATICACIDVEN 1.7 1.0    Recent Results (from the past 240 hours)  Culture, blood (routine x 2)     Status: None (Preliminary result)   Collection Time: 12/13/23  4:17 PM   Specimen: BLOOD  Result Value Ref Range Status   Specimen Description BLOOD BLOOD RIGHT FOREARM  Final   Special Requests   Final    BOTTLES DRAWN AEROBIC AND ANAEROBIC Blood Culture adequate volume   Culture   Final    NO  GROWTH 2 DAYS Performed at [facility], [address], [place], [place] [address]    Report Status PENDING  Incomplete  Culture, blood (routine x 2)     Status: None (Preliminary result)   Collection Time: 12/13/23  4:18 PM   Specimen: BLOOD  Result Value Ref Range Status   Specimen Description BLOOD BLOOD RIGHT FOREARM  Final   Special Requests   Final    BOTTLES DRAWN AEROBIC AND ANAEROBIC Blood Culture adequate volume   Culture   Final    NO GROWTH 2 DAYS Performed at [facility], [address], [place], [place] [address]    Report Status PENDING  Incomplete     Radiology Studies: ECHOCARDIOGRAM COMPLETE Result Date: 12/15/2023    ECHOCARDIOGRAM REPORT   Patient Name:   Bob Ortiz Date of Exam: 12/14/2023 Medical Rec #:  [id number]    Height:       69.0 in Accession #:  [id number]   Weight:       185.0 lb Date of Birth:  [birth year]     BSA:          1.999 m Patient Age:    83 years     BP:           122/85 mmHg Patient Gender: M            HR:           70 bpm. Exam Location:  [facility] Procedure: 2D Echo, Cardiac Doppler and Color Doppler (Both Spectral and Color            Flow Doppler were utilized during procedure). Indications:     I46.9 Cardiac Arrest  History:         Patient has no prior history of Echocardiogram examinations.  Sonographer:     Brigid Canada RDCS Referring Phys:  [id number] CARALYN HUDSON Diagnosing Phys: Sabina Custovic IMPRESSIONS  1. Left ventricular ejection fraction, by estimation, is <20%. The left ventricle has severely decreased function. The left ventricle demonstrates global hypokinesis. The left ventricular internal cavity size was moderately dilated. Left ventricular diastolic parameters are consistent with Grade III diastolic dysfunction (restrictive).  2. Right ventricular systolic function is mildly reduced. The right ventricular size is mildly enlarged. There is mildly elevated pulmonary artery systolic pressure. The estimated  right ventricular systolic pressure is 36.5 mmHg.  3. Left atrial size was severely dilated.  4. Right atrial size was moderately dilated.  5. Large pleural effusion.  6. The mitral valve is abnormal. Severe mitral valve regurgitation. No evidence of mitral stenosis.  7. The tricuspid valve is abnormal. Tricuspid valve regurgitation is moderate.  8. The aortic valve is normal in structure. Aortic valve regurgitation is not visualized. No aortic stenosis is present.  9. There is moderate dilatation of the ascending aorta. 10. The inferior vena cava is normal in size with greater than 50% respiratory variability, suggesting right atrial pressure of 3 mmHg. FINDINGS  Left Ventricle: Left ventricular ejection fraction, by estimation, is <20%. The left ventricle has severely decreased function. The left ventricle demonstrates global hypokinesis. The left ventricular internal cavity size was moderately dilated. There is no left ventricular hypertrophy. Left ventricular diastolic parameters are consistent with Grade III diastolic dysfunction (restrictive). Right Ventricle: The right ventricular size is mildly enlarged. No increase in right ventricular wall thickness. Right ventricular systolic function is mildly reduced. There is mildly elevated pulmonary artery systolic pressure. The tricuspid regurgitant  velocity is 2.67 m/s, and with an assumed right atrial pressure of 8 mmHg, the estimated right ventricular systolic pressure is 36.5 mmHg. Left Atrium: Left atrial size was severely dilated. Right Atrium: Right atrial size was moderately dilated. Pericardium: There is no evidence of pericardial effusion. Mitral Valve: The mitral valve is abnormal. Severe mitral valve regurgitation. No evidence of mitral valve stenosis. Tricuspid Valve: The tricuspid valve is abnormal. Tricuspid valve regurgitation is moderate . No evidence of tricuspid stenosis. Aortic Valve: The aortic valve is normal in structure. Aortic valve  regurgitation is not visualized. No aortic stenosis is present. Pulmonic Valve: The pulmonic valve was normal in structure. Pulmonic valve regurgitation is not visualized. No evidence of pulmonic stenosis. Aorta: There is moderate dilatation of the ascending aorta. Venous: The inferior vena cava is normal in size with greater than 50% respiratory variability, suggesting right atrial pressure of 3 mmHg. IAS/Shunts: No atrial level shunt detected by color flow Doppler. Additional Comments: There is a large pleural  effusion.  LEFT VENTRICLE PLAX 2D LVIDd:         6.40 cm   Diastology LVIDs:         5.50 cm   LV e' medial:    6.24 cm/s LV PW:         1.00 cm   LV E/e' medial:  11.4 LV IVS:        0.90 cm   LV e' lateral:   6.25 cm/s LVOT diam:     2.30 cm   LV E/e' lateral: 11.4 LV SV:         47 LV SV Index:   23 LVOT Area:     4.15 cm  RIGHT VENTRICLE            IVC RV Basal diam:  5.40 cm    IVC diam: 1.90 cm RV S prime:     8.98 cm/s TAPSE (M-mode): 1.4 cm LEFT ATRIUM              Index        RIGHT ATRIUM           Index LA diam:        5.10 cm  2.55 cm/m   RA Area:     24.00 cm LA Vol (A2C):   135.0 ml 67.55 ml/m  RA Volume:   77.80 ml  38.93 ml/m LA Vol (A4C):   106.0 ml 53.04 ml/m LA Biplane Vol: 123.0 ml 61.54 ml/m  AORTIC VALVE LVOT Vmax:   64.20 cm/s LVOT Vmean:  40.900 cm/s LVOT VTI:    0.113 m  AORTA Ao Root diam: 4.00 cm Ao Asc diam:  4.50 cm MITRAL VALVE               TRICUSPID VALVE MV Area (PHT): 4.49 cm    TR Peak grad:   28.5 mmHg MV Decel Time: 169 msec    TR Vmax:        267.00 cm/s MR Peak grad: 85.7 mmHg MR Mean grad: 57.0 mmHg    SHUNTS MR Vmax:      463.00 cm/s  Systemic VTI:  0.11 m MR Vmean:     356.0 cm/s   Systemic Diam: 2.30 cm MV E velocity: 71.30 cm/s MV A velocity: 26.70 cm/s MV E/A ratio:  2.67 Sabina Custovic Electronically signed by Isabell Manzanilla Signature Date/Time: 12/15/2023/1:54:24 PM    Final    EEG adult Result Date: 12/14/2023 Arleene Lack, MD     12/14/2023   4:55 PM Patient Name: Bob Ortiz MRN: [id number] Epilepsy Attending: Arleene Lack Referring Physician/Provider: Kimberley Penman, MD Date: 12/14/2023 Duration: 26.25 mins Patient history: 83yo M with seizure like activity. EEG to evaluate for seizure Level of alertness: Awake AEDs during EEG study: None Technical aspects: This EEG study was done with scalp electrodes positioned according to the 10-20 International system of electrode placement. Electrical activity was reviewed with band pass filter of 1-70Hz , sensitivity of 7 uV/mm, display speed of 31mm/sec with a 60Hz  notched filter applied as appropriate. EEG data were recorded continuously and digitally stored.  Video monitoring was available and reviewed as appropriate. Description: The posterior dominant rhythm consists of 8 Hz activity of moderate voltage (25-35 uV) seen predominantly in posterior head regions, symmetric and reactive to eye opening and eye closing. Hyperventilation and photic stimulation were not performed.   IMPRESSION: This study is within normal limits. No seizures or epileptiform discharges were seen throughout the recording. A normal  interictal EEG does not exclude the diagnosis of epilepsy. Priyanka O Yadav        Scheduled Meds:  apixaban  5 mg Oral BID   cyanocobalamin  1,000 mcg Oral Daily   furosemide  40 mg Intravenous BID   potassium chloride  20 mEq Oral Once   Continuous Infusions:   LOS: 2 days   Time spent: 50 mins  Luna Salinas, MD [facility] Pager [phone]  If 7PM-7AM, please contact night-coverage [email or web address]  12/15/2023, 4:31 PM

## 2023-12-15 NOTE — Progress Notes (Addendum)
 [facility] CLINIC CARDIOLOGY PROGRESS NOTE       Patient ID: Bob Ortiz MRN: [id number] DOB/AGE: 82-03-1941 82 y.o.  Admit date: 12/13/2023 Referring Physician Dr. Seleta Dakins Primary Physician Alena Hush, MD Primary Cardiologist Dr. Beau Bound Reason for Consultation Elevated Troponins   HPI: Bob Ortiz is a 83 y.o. male  with a past medical history of chronic HFrEF, nonischemic cardiomyopathy LBBB s/p CRT-P ([employer] X4 CRTP -12/08/23), hypertension, coronary artery disease, paroxsymal atrial fibrillation (Eliquis), type 2 diabetes mellitus who presented to the ED on 12/13/2023 for concern for seizure like activity. Patient had biventricular pacemaker placement on 12/08/2023. Cardiology was consulted for further evaluation.   Interval History: -Patient seen and examined this AM and laying comfortably in hospital bed at a slight incline with family at bedside. Patient states he feels good and denies SOB or chest pain. Daughter reports patient hallucinates with all loop diuretics. -Patients BP and HR stable this AM. Patient not on tele.  -UOP yesterday 31mL,with Cr 0.74 > 0.90 this AM.  -Na 132, K 3.4. -Patient remains on room air with stable SpO2.    Review of systems complete and found to be negative unless listed above    History reviewed. No pertinent past medical history.  Past Surgical History:  Procedure Laterality Date   FRACTURE SURGERY Right    shoulder    Medications Prior to Admission  Medication Sig Dispense Refill Last Dose/Taking   Cholecalciferol (VITAMIN D-1000 MAX ST) 25 MCG (1000 UT) tablet Take 1,000 Units by mouth daily.   12/13/2023   Cholecalciferol 50 MCG (2000 UT) TABS Take 2,000 Units by mouth daily.   12/13/2023   cyanocobalamin (VITAMIN B12) 1000 MCG tablet Take 1,000 mcg by mouth daily.   12/13/2023   ELIQUIS 5 MG TABS tablet Take 5 mg by mouth 2 (two) times daily.   12/13/2023   furosemide (LASIX) 40 MG tablet Take 40 mg by mouth daily.   12/13/2023    magnesium oxide (MAG-OX) 400 (240 Mg) MG tablet Take 2 tablets by mouth daily.   12/13/2023   Melatonin 1 MG CHEW Chew 1 tablet by mouth daily as needed.   12/12/2023   amiodarone (PACERONE) 200 MG tablet Take 200 mg by mouth 2 (two) times daily. (Patient not taking: Reported on 12/13/2023)   Not Taking   bumetanide (BUMEX) 1 MG tablet Take by mouth. (Patient not taking: Reported on 12/13/2023)   Not Taking   carvedilol (COREG) 3.125 MG tablet Take 3.125 mg by mouth in the morning and at bedtime. (Patient not taking: Reported on 12/13/2023)   Not Taking   empagliflozin (JARDIANCE) 10 MG TABS tablet Take 1 tablet by mouth daily. (Patient not taking: Reported on 12/13/2023)   Not Taking   ENTRESTO 24-26 MG Take 1 tablet by mouth 2 (two) times daily. (Patient not taking: Reported on 12/13/2023)   Not Taking   eplerenone (INSPRA) 25 MG tablet Take 1 tablet by mouth daily. (Patient not taking: Reported on 12/13/2023)   Not Taking   losartan (COZAAR) 100 MG tablet Take 100 mg by mouth daily. (Patient not taking: Reported on 12/13/2023)   Not Taking   metoprolol succinate (TOPROL-XL) 25 MG 24 hr tablet Take 12.5 mg by mouth daily. (Patient not taking: Reported on 12/13/2023)   Not Taking   omeprazole (PRILOSEC) 40 MG capsule Take 40 mg by mouth daily. (Patient not taking: Reported on 12/13/2023)   Not Taking   predniSONE (DELTASONE) 5 MG tablet Take 1 tablet  by mouth daily. (Patient not taking: Reported on 12/13/2023)   Not Taking   spironolactone (ALDACTONE) 25 MG tablet Take by mouth. (Patient not taking: Reported on 12/13/2023)   Not Taking   thiamine (VITAMIN B1) 100 MG tablet Take 100 mg by mouth daily. (Patient not taking: Reported on 12/13/2023)   Not Taking   torsemide (DEMADEX) 20 MG tablet Take 20 mg by mouth daily. (Patient not taking: Reported on 12/13/2023)   Not Taking   traMADol  (ULTRAM ) 50 MG tablet Take 1 tablet (50 mg total) by mouth 2 (two) times daily. (Patient not taking: Reported on 12/13/2023) 10  tablet 0 Not Taking   Social History   Socioeconomic History   Marital status: Married    Spouse name: Not on file   Number of children: Not on file   Years of education: Not on file   Highest education level: Not on file  Occupational History   Not on file  Tobacco Use   Smoking status: Former    Current packs/day: 0.00    Types: Cigarettes    Quit date: 88    Years since quitting: 50.4   Smokeless tobacco: Never  Vaping Use   Vaping status: Never Used  Substance and Sexual Activity   Alcohol use: Yes    Comment: occassionally   Drug use: Never   Sexual activity: Not on file  Other Topics Concern   Not on file  Social History Narrative   Not on file   Social Drivers of Health   Financial Resource Strain: Low Risk  (12/04/2023)   Received from [facility]   Overall Financial Resource Strain (CARDIA)    Difficulty of Paying Living Expenses: Not hard at all  Food Insecurity: No Food Insecurity (12/13/2023)   Hunger Vital Sign    Worried About Running Out of Food in the Last Year: Never true    Ran Out of Food in the Last Year: Never true  Transportation Needs: No Transportation Needs (12/13/2023)   PRAPARE - Administrator, Civil Service (Medical): No    Lack of Transportation (Non-Medical): No  Physical Activity: Sufficiently Active (11/01/2023)   Received from [facility]   Exercise Vital Sign    Days of Exercise per Week: 4 days    Minutes of Exercise per Session: 40 min  Stress: Not on file  Social Connections: Moderately Isolated (12/13/2023)   Social Connection and Isolation Panel [NHANES]    Frequency of Communication with Friends and Family: More than three times a week    Frequency of Social Gatherings with Friends and Family: More than three times a week    Attends Religious Services: Never    Database administrator or Organizations: No    Attends Banker Meetings: Never    Marital Status:  Married  Catering manager Violence: Patient Unable To Answer (12/13/2023)   Humiliation, Afraid, Rape, and Kick questionnaire    Fear of Current or Ex-Partner: Patient unable to answer    Emotionally Abused: Patient unable to answer    Physically Abused: Patient unable to answer    Sexually Abused: Patient unable to answer    Family History  Problem Relation Age of Onset   Stomach cancer Mother    Heart attack Father 53   Stroke Father      Vitals:   12/14/23 1600 12/14/23 1741 12/14/23 2226 12/15/23 0715  BP: 106/68 108/72 (!) 112/59 114/70  Pulse:  72 82  71  Resp:  16  16  Temp:  97.6 F (36.4 C) 97.8 F (36.6 C) 98 F (36.7 C)  TempSrc:    Oral  SpO2:  100% 99% 97%  Weight:    77.9 kg  Height:    5\' 9"  (1.753 m)    PHYSICAL EXAM General: chronically ill appearing elderly male, well nourished, in no acute distress. HEENT: Normocephalic and atraumatic. Neck: No JVD.   Lungs: Normal respiratory effort on room air. Clear bilaterally to auscultation. No wheezes, crackles, rhonchi.  Heart: HRRR, paced. Normal S1 and S2 without gallops or murmurs.  Abdomen: Non-distended appearing.  Msk: Normal strength and tone for age. Extremities: Warm and well perfused. No clubbing, cyanosis. No edema.  Neuro: Alert and oriented X 3. Psych: Answers questions appropriately.   Labs: Basic Metabolic Panel: Recent Labs    12/13/23 1344 12/14/23 0451 12/15/23 0543  NA 130* 130* 134*  K 3.6 3.5 3.4*  CL 96* 101 97*  CO2 23 22 29   GLUCOSE 100* 64* 85  BUN 13 10 13   CREATININE 0.92 0.74 0.90  CALCIUM 8.4* 7.4* 8.6*  MG 2.1 2.0 2.0  PHOS 3.9  --   --    Liver Function Tests: Recent Labs    12/14/23 0451 12/15/23 0543  AST 69* 57*  ALT 47* 49*  ALKPHOS 105 116  BILITOT 1.1 1.1  PROT 5.4* 6.2*  ALBUMIN 2.7* 3.1*   No results for input(s): "LIPASE", "AMYLASE" in the last 72 hours. CBC: Recent Labs    12/13/23 1344 12/14/23 0451 12/15/23 0543  WBC 9.2 6.9 7.6   NEUTROABS 6.8  --   --   HGB 13.6 12.1* 12.2*  HCT 42.6 36.4* 35.8*  MCV 100.0 94.8 91.3  PLT 207 189 184   Cardiac Enzymes: Recent Labs    12/13/23 1344 12/13/23 1547  TROPONINIHS 322* 405*   BNP: Recent Labs    12/13/23 1344  BNP 780.7*   D-Dimer: No results for input(s): "DDIMER" in the last 72 hours. Hemoglobin A1C: No results for input(s): "HGBA1C" in the last 72 hours. Fasting Lipid Panel: No results for input(s): "CHOL", "HDL", "LDLCALC", "TRIG", "CHOLHDL", "LDLDIRECT" in the last 72 hours. Thyroid Function Tests: Recent Labs    12/14/23 0451  TSH 4.346   Anemia Panel: No results for input(s): "VITAMINB12", "FOLATE", "FERRITIN", "TIBC", "IRON", "RETICCTPCT" in the last 72 hours.   Radiology: EEG adult Result Date: 12/14/2023 Arleene Lack, MD     12/14/2023  4:55 PM Patient Name: Bob Ortiz MRN: [id number] Epilepsy Attending: Arleene Lack Referring Physician/Provider: Kimberley Penman, MD Date: 12/14/2023 Duration: 26.25 mins Patient history: 83yo M with seizure like activity. EEG to evaluate for seizure Level of alertness: Awake AEDs during EEG study: None Technical aspects: This EEG study was done with scalp electrodes positioned according to the 10-20 International system of electrode placement. Electrical activity was reviewed with band pass filter of 1-70Hz , sensitivity of 7 uV/mm, display speed of 90mm/sec with a 60Hz  notched filter applied as appropriate. EEG data were recorded continuously and digitally stored.  Video monitoring was available and reviewed as appropriate. Description: The posterior dominant rhythm consists of 8 Hz activity of moderate voltage (25-35 uV) seen predominantly in posterior head regions, symmetric and reactive to eye opening and eye closing. Hyperventilation and photic stimulation were not performed.   IMPRESSION: This study is within normal limits. No seizures or epileptiform discharges were seen throughout the recording. A normal  interictal EEG does not  exclude the diagnosis of epilepsy. Priyanka O Yadav   US  ABDOMEN LIMITED RUQ (LIVER/GB) Result Date: 12/13/2023 CLINICAL DATA:  Transaminitis EXAM: ULTRASOUND ABDOMEN LIMITED RIGHT UPPER QUADRANT COMPARISON:  Chest x-ray and chest CTA earlier 12/13/2023 FINDINGS: Gallbladder: Previous cholecystectomy. Common bile duct: Diameter: 2 mm Liver: No focal lesion identified. Within normal limits in parenchymal echogenicity. Portal vein is patent on color Doppler imaging with normal direction of blood flow towards the liver. Other: Right pleural effusion. Please correlate with prior x-ray and CTA. IMPRESSION: Previous cholecystectomy.  No ductal dilatation. Electronically Signed   By: Adrianna Horde M.D.   On: 12/13/2023 16:39   CT Head Wo Contrast Result Date: 12/13/2023 CLINICAL DATA:  Provided history: Mental status change, unknown cause. EXAM: CT HEAD WITHOUT CONTRAST TECHNIQUE: Contiguous axial images were obtained from the base of the skull through the vertex without intravenous contrast. RADIATION DOSE REDUCTION: This exam was performed according to the departmental dose-optimization program which includes automated exposure control, adjustment of the mA and/or kV according to patient size and/or use of iterative reconstruction technique. COMPARISON:  Head CT 09/01/2023. FINDINGS: Brain: Generalized cerebral atrophy. Patchy and ill-defined hypoattenuation within the cerebral white matter, nonspecific but compatible with mild chronic small vessel ischemic disease. There is no acute intracranial hemorrhage. No demarcated cortical infarct. No extra-axial fluid collection. No evidence of an intracranial mass. No midline shift. Vascular: No hyperdense vessel.  Atherosclerotic calcifications. Skull: No calvarial fracture or aggressive osseous lesion. Visible sinuses/orbits: No mass or acute finding within the imaged orbits. Minimal mucosal thickening within the right ethmoid and right sphenoid  sinuses. Other: Trace left mastoid effusion. IMPRESSION: 1. No evidence of an acute intracranial abnormality. 2. Parenchymal atrophy and chronic small vessel ischemic disease. 3. Minor paranasal sinus mucosal thickening at the imaged levels. 4. Trace left mastoid effusion. Electronically Signed   By: Bascom Lily D.O.   On: 12/13/2023 15:36   DG Chest 1 View Result Date: 12/13/2023 CLINICAL DATA:  Unresponsiveness EXAM: CHEST  1 VIEW COMPARISON:  March 22, 2017 FINDINGS: No change in the left subclavian bipolar ICDF pacemaker device tip of the leads in good position no evidence of discontinuity Bilateral interstitial prominence and small left effusion could correlate with congestive changes. Heart upper limits of normal. No pneumothorax. IMPRESSION: Congestive changes. Electronically Signed   By: Fredrich Jefferson M.D.   On: 12/13/2023 15:14   CT Angio Chest PE W/Cm &/Or Wo Cm Result Date: 12/13/2023 CLINICAL DATA:  Pulmonary embolism EXAM: CT ANGIOGRAPHY CHEST WITH CONTRAST TECHNIQUE: Multidetector CT imaging of the chest was performed using the standard protocol during bolus administration of intravenous contrast. Multiplanar CT image reconstructions and MIPs were obtained to evaluate the vascular anatomy. RADIATION DOSE REDUCTION: This exam was performed according to the departmental dose-optimization program which includes automated exposure control, adjustment of the mA and/or kV according to patient size and/or use of iterative reconstruction technique. CONTRAST:  75mL OMNIPAQUE IOHEXOL 350 MG/ML SOLN COMPARISON:  None Available. FINDINGS: Cardiovascular: Satisfactory opacification of the pulmonary arteries to the segmental level. No evidence of pulmonary embolism. Normal heart size. No pericardial effusion. Coronary artery calcifications, small to medium-sized pericardial effusion Mediastinum/Nodes: No enlarged mediastinal, hilar, or axillary lymph nodes. Thyroid gland, trachea, and esophagus demonstrate  no significant findings. Lungs/Pleura: Bilateral pleural effusions, left larger than right with basilar atelectasis. Interstitial prominence with ground-glass appearance could correlate with congestive changes versus superimposed pneumonitis. No evidence of pulmonary embolism. No aortic dissection on this limited enhanced pulmonary CT angiogram images Upper  Abdomen: No acute abnormality. Musculoskeletal: No chest wall abnormality. No acute or significant osseous findings. Review of the MIP images confirms the above findings. IMPRESSION: *No evidence of pulmonary embolism. *Bilateral pleural effusions, left larger than right with basilar atelectasis. *Interstitial prominence with ground-glass appearance could correlate with congestive changes versus superimposed pneumonitis. *Coronary artery calcifications, small to medium-sized pericardial effusion. Electronically Signed   By: Fredrich Jefferson M.D.   On: 12/13/2023 15:09    ECHO ordered  TELEMETRY reviewed by me 12/15/2023: Not on tele  EKG reviewed by me: V-paced, rate 70 bpm  Data reviewed by me 12/15/2023: last 24h vitals tele labs imaging I/O hospitalist note  Principal Problem:   Seizure [facility]) Active Problems:   Bilateral pleural effusion   Elevated troponin   Atrial fibrillation, chronic (HCC)   Essential hypertension   Transaminitis   NICM (nonischemic cardiomyopathy) (HCC)    ASSESSMENT AND PLAN:   Bob Ortiz is a 83 y.o. male  with a past medical history of chronic HFrEF, nonischemic cardiomyopathy LBBB s/p CRT-P ([employer] X4 CRTP -12/08/23), hypertension, coronary artery disease, paroxsymal atrial fibrillation (Eliquis), type 2 diabetes mellitus who presented to the ED on 12/13/2023 for concern for seizure like activity. Patient had biventricular pacemaker placement on 12/08/2023. Cardiology was consulted for further evaluation.   # Seizure like activity # Nonischemic cardiomyopathy, LBBB s/p CRT-P (12/08/23) # Paroxsymal  atrial fibrillation  # Hypertension # Demand Ischemia # Acute on chronic HFrEF Patient with recent CRT-P placement on 05/07 reports to ED via EMS due to having seizure-like activity, chest compressions initiated by daughter at home. EMS arrived and patient had pulse and responsive. BNP elevated 780. Trops elevated and trended 322 > 405. CXR and CT reveals bilateral pleural effusions. EKG in ED with V-paced, rate 70 bpm. Investigated patients CRT-P at bedside with [employer] device with evidence of arrhthymias or significant events found. EEG study within normal limits.  Of note patient has BP and HR stable -Echo ordered -Continue Eliquis 5 mg BID for stroke risk reduction. -One more dose IV lasix 40 mg this afternoon. Closely monitor renal function and UOP.         -Of note pt has document allergies to bumex, spironolactone, and torsemide.         -Pt's daughter reports pt hallucinates with all loop diuretics  -Will consider thiazide tomorrow.  -Monitor and replenish electrolytes for a goal K >4, Mag >2  -Elevated troponins most consistent with demand/supply mismatch and not ACS.   This patient's plan of care was discussed and created with Dr. Custovic and she is in agreement.  Signed: Creighton Doffing, PA-C  12/15/2023, 10:31 AM [facility]

## 2023-12-15 NOTE — Progress Notes (Signed)
 Daily Progress Note   Patient Name: Bob Ortiz       Date: 12/15/2023 DOB: Apr 21, 1941  Age: 83 y.o. MRN#: [id number] Attending Physician: Bob Salinas, MD Primary Care Physician: Bob Hush, MD Admit Date: 12/13/2023  Reason for Consultation/Follow-up: Establishing goals of care  Subjective: Notes and labs reviewed.  Into see patient with pharmacy Bob Ortiz. Patient is currently sitting in bedside chair eating breakfast, and has consumed 100% of his breakfast.  Patient denies complaint.  No family at bedside.  He is oriented to self and is able to tell me that his daughter Bob Ortiz and one of his grandchildren live with both he and his wife.  He is unable to answer further questions correctly at this time.  As per discussion with pharmacy, communicated with cardiology regarding recommendations for transition of diuretic as daughter has felt that his  diuretics were what was causing his confusion.  Agree with neurology in regards to outpatient evaluation for dementia, and his family is having difficulty with this potential diagnosis.   Length of Stay: 2  Current Medications: Scheduled Meds:   apixaban  5 mg Oral BID   cyanocobalamin  1,000 mcg Oral Daily   furosemide  40 mg Intravenous BID   potassium chloride  20 mEq Oral Once    Continuous Infusions:   PRN Meds: acetaminophen **OR** acetaminophen, hydrALAZINE, LORazepam, melatonin, ondansetron **OR** ondansetron (ZOFRAN) IV, mouth rinse  Physical Exam Pulmonary:     Effort: Pulmonary effort is normal.  Skin:    General: Skin is warm and dry.  Neurological:     Mental Status: He is alert. He is disoriented.             Vital Signs: BP 114/70 (BP Location: Left Arm)   Pulse 71   Temp 98 F (36.7 C) (Oral)   Resp 16   Ht  5\' 9"  (1.753 m)   Wt 77.9 kg   SpO2 97%   BMI 25.36 kg/m  SpO2: SpO2: 97 % O2 Device: O2 Device: Room Air O2 Flow Rate: O2 Flow Rate (L/min): 2 L/min  Intake/output summary:  Intake/Output Summary (Last 24 hours) at 12/15/2023 1206 Last data filed at 12/15/2023 1052 Gross per 24 hour  Intake 447.86 ml  Output 700 ml  Net -252.14 ml   LBM:   Baseline Weight:  Weight: 83.9 kg Most recent weight: Weight: 77.9 kg       Patient Active Problem List   Diagnosis Date Noted   Seizure (HCC) 12/13/2023   Bilateral pleural effusion 12/13/2023   Elevated troponin 12/13/2023   Atrial fibrillation, chronic (HCC) 12/13/2023   Essential hypertension 12/13/2023   Transaminitis 12/13/2023   NICM (nonischemic cardiomyopathy) (HCC) 12/13/2023    Palliative Care Assessment & Plan    Recommendations/Plan: Agree with neurology's recommendation for outpatient dementia evaluation. Cardiology considering changing diuretic as daughter has felt his confusion was directly related to diuretic therapy. As per GOC conversation yesterday, patient would want to "try" CPR but would not want to live in a vegetative state.  Code Status:    Code Status Orders  (From admission, onward)           Start     Ordered   12/13/23 1554  Full code  Continuous       Question:  By:  Answer:  Default: patient does not have capacity for decision making, no surrogate or prior directive available   12/13/23 1555           Code Status History     This patient has a current code status but no historical code status.      Advance Directive Documentation    Flowsheet Row Most Recent Value  Type of Advance Directive Healthcare Power of Attorney, Living will  Pre-existing out of facility DNR order (yellow form or pink MOST form) --  "MOST" Form in Place? --       Thank you for allowing the Palliative Medicine Team to assist in the care of this patient.    Bob Standard, NP  Please contact  Palliative Medicine Team phone at [phone] for questions and concerns.

## 2023-12-15 NOTE — Progress Notes (Signed)
   12/15/23 1045  Spiritual Encounters  Type of Visit Follow up  Care provided to: Pt and family  Referral source Chaplain assessment  Reason for visit Routine spiritual support  OnCall Visit No  Spiritual Framework  Family Stress Factors Major life changes (Daughter notices a marked difference in patient's mental status since starting different medications.)  Interventions  Spiritual Care Interventions Made Compassionate presence  Intervention Outcomes  Outcomes Reduced anxiety  Spiritual Care Plan  Spiritual Care Issues Still Outstanding No further spiritual care needs at this time (see row info)   Chaplain met patient and daughter when in the ED and followed up to see how patient is doing. Wife and daughter at bedside.

## 2023-12-15 NOTE — Progress Notes (Signed)
 NEUROLOGY CONSULT FOLLOW UP NOTE   Date of service: Dec 15, 2023 Patient Name: Bob Ortiz MRN:  [id number] DOB:  26-Mar-1941  Interval Hx/subjective  Lying in bed eating. Family states that he was chewing on a blanket earlier today, thinking that it was ice cream.  Vitals   Vitals:   12/14/23 1600 12/14/23 1741 12/14/23 2226 12/15/23 0715  BP: 106/68 108/72 (!) 112/59 114/70  Pulse:  72 82 71  Resp:  16  16  Temp:  97.6 F (36.4 C) 97.8 F (36.6 C) 98 F (36.7 C)  TempSrc:    Oral  SpO2:  100% 99% 97%  Weight:    77.9 kg  Height:    5\' 9"  (1.753 m)     Body mass index is 25.36 kg/m.  Physical Exam   Physical Exam HEENT- [place]/AT.  Lungs- Respirations unlabored Extremities- Warm and well-perfused   Neurological Examination Mental Status: Awake and alert. Can state his first and last name, but not oriented to the year, month, day of the week or the fact that he is in a hospital (today he states the name of a facility that family does not recognize). He is abulic but will respond to questions. Has hypophonic speech. Able to follow simple commands without difficulty. Did not exhibit behaviors consistent with hallucinations at the time of my exam. Pleasant and cooperative with no agitation.   Cranial Nerves: II: Fixates and tracks examiner and makes eye contact. PERRL. III,IV, VI: No ptosis. EOMI. No nystagmus. VII: Smile symmetric VIII: HOH IX,X: Hypophonic speech is noted XI: Symmetric XII: Midline tongue extension Motor: BUE: Unchanged. Bradykinesia noted. No rest tremor seen.  BLE: Wiggles toes equally. Elevates legs bilaterally. Resists with knee extension 5/5. Sensory: Intact to touch x 4.  Gait: Deferred  Medications  Current Facility-Administered Medications:    acetaminophen (TYLENOL) tablet 650 mg, 650 mg, Oral, Q6H PRN, 650 mg at 12/14/23 1309 **OR** acetaminophen (TYLENOL) suppository 650 mg, 650 mg, Rectal, Q6H PRN, Cox, Amy N, DO   apixaban (ELIQUIS)  tablet 5 mg, 5 mg, Oral, BID, Dobbs, Andrea K, RPH, 5 mg at 12/14/23 2102   cyanocobalamin (VITAMIN B12) tablet 1,000 mcg, 1,000 mcg, Oral, Daily, Cox, Amy N, DO, 1,000 mcg at 12/14/23 1051   furosemide (LASIX) injection 40 mg, 40 mg, Intravenous, BID, Decoste, Gabriella, PA-C   haloperidol lactate (HALDOL) injection 1 mg, 1 mg, Intravenous, Q6H PRN, Mason Sole, Vipul, MD   hydrALAZINE (APRESOLINE) injection 5 mg, 5 mg, Intravenous, Q6H PRN, Cox, Amy N, DO   LORazepam (ATIVAN) injection 0.5 mg, 0.5 mg, Intravenous, Q6H PRN, Cox, Amy N, DO   melatonin tablet 5 mg, 5 mg, Oral, QHS PRN, Cox, Amy N, DO, 5 mg at 12/14/23 1824   ondansetron (ZOFRAN) tablet 4 mg, 4 mg, Oral, Q6H PRN **OR** ondansetron (ZOFRAN) injection 4 mg, 4 mg, Intravenous, Q6H PRN, Cox, Amy N, DO   Oral care mouth rinse, 15 mL, Mouth Rinse, Q2H, Cox, Amy N, DO, 15 mL at 12/15/23 0544   Oral care mouth rinse, 15 mL, Mouth Rinse, PRN, Cox, Amy N, DO   potassium chloride SA (KLOR-CON M) CR tablet 40 mEq, 40 mEq, Oral, Once, Decoste, Gabriella, PA-C   risperiDONE (RISPERDAL M-TABS) disintegrating tablet 0.5 mg, 0.5 mg, Oral, QHS, Mason Sole, Vipul, MD, 0.5 mg at 12/14/23 2102  Labs and Diagnostic Imaging   CBC:  Recent Labs  Lab 12/13/23 1344 12/14/23 0451 12/15/23 0543  WBC 9.2 6.9 7.6  NEUTROABS 6.8  --   --  HGB 13.6 12.1* 12.2*  HCT 42.6 36.4* 35.8*  MCV 100.0 94.8 91.3  PLT 207 189 184    Basic Metabolic Panel:  Lab Results  Component Value Date   NA 134 (L) 12/15/2023   K 3.4 (L) 12/15/2023   CO2 29 12/15/2023   GLUCOSE 85 12/15/2023   BUN 13 12/15/2023   CREATININE 0.90 12/15/2023   CALCIUM 8.6 (L) 12/15/2023   GFRNONAA >60 12/15/2023   Lipid Panel: No results found for: "LDLCALC" HgbA1c: No results found for: "HGBA1C" Urine Drug Screen: No results found for: "LABOPIA", "COCAINSCRNUR", "LABBENZ", "AMPHETMU", "THCU", "LABBARB"  Alcohol Level No results found for: "ETH" INR  Lab Results  Component Value Date   INR  1.4 (H) 12/13/2023   APTT  Lab Results  Component Value Date   APTT 82 (H) 12/14/2023     Assessment  KELLUM ADONA is a 83 y.o. male presenting to the ED with new-onset seizure activity.  - Exam today reveals improved level of alertness, but poor orientation.    - EEG was within normal limits. - CT head: No evidence of an acute intracranial abnormality. Parenchymal atrophy and chronic small vessel ischemic disease. - Low EF of 20% on recent prior echocardiogram.  - Impression:  - DDx for his seizure activity included new onset epileptic seizure but EEG was negative. A syncopal convulsion versus provoked seizure secondary to transient hypoxia and/or brain hypoperfusion in the context of his CHF with severely reduced EF are now felt to be more likely.   - He may have an underlying dementia. His waxing-waning levels of cognition described by family would point more to Lewy body dementia given that he also has formed visual hallucinations.     Recommendations  - Do not restart Ultram  at discharge, as this medication can lower the seizure threshold - Discontinue all antipsychotics except for possibly Seroquel, which has a low potential compared to other antipsychotics for triggering acute worsening of cognition in patients with possible or confirmed Lewy body dementia (Haldol and risperdal have been discontinued this morning).  - Inpatient seizure precautions - Frequent neuro checks - Optimization of cardiac function, fluid status and BP - Orthostatics when able - Given no electrographic abnormality on EEG suggesting epileptic seizure tendency, there is no indication for an anticonvulsant for a first-time seizure.  - Avoid hypotension - Continue to monitor for possible improvement - PT/OT/Speech - Will need outpatient dementia evaluation for possible Lewy body dementia.  - May need to switch Lasix to a non-loop diuretic as this class of medications can cause delirium - Neurohospitalist  service will sign off. Please call if there are additional questions.  ______________________________________________________________________   Hope Ly, Bodhi Moradi, MD [facility]

## 2023-12-15 NOTE — Evaluation (Addendum)
 Occupational Therapy Evaluation Patient Details Name: Bob Ortiz MRN: [id number] DOB: Apr 11, 1941 Today's Date: 12/15/2023   History of Present Illness   Bob Ortiz is an 83 year old male with history of CAD with calcification, left bundle branch block, nonischemic cardiomyopathy, hypertension, chronic atrial fibrillation, on Eliquis, who presents ED for chief concerns of seizure-like activity.     Clinical Impressions Pt was seen for OT evaluation this date. Prior to hospital admission, pt was independent in all ADL and functional mobility with DME, living in a 3 story home with spouse (occupy floors 2-3 while additional family members occupy 1st floor). Pt presents to acute OT demonstrating impaired ADL performance and functional mobility 2/2 overall decline in medical status (See OT problem list for additional functional deficits). Pt currently requires CGA with grooming tasks, Mod A for LB ADLs, Min A for UB ADLs, Mod A to initiate functional transfers, and CGA/Min A for functional mobility very short distances in living environment utilizing DME.  During all task engagement, patient required extensive cuing for task initiation, follow through, cessation, sequencing, safety awareness.  Pt would benefit from skilled OT services to address noted impairments and functional limitations (see below for any additional details) in order to maximize safety and independence while minimizing falls risk and caregiver burden.     If plan is discharge home, recommend the following:   A lot of help with bathing/dressing/bathroom;Direct supervision/assist for medications management;Supervision due to cognitive status;Assist for transportation;Assistance with cooking/housework;A little help with walking and/or transfers     Functional Status Assessment   Patient has had a recent decline in their functional status and demonstrates the ability to make significant improvements in function in a reasonable  and predictable amount of time.     Equipment Recommendations   BSC/3in1;Tub/shower seat     Recommendations for Other Services         Precautions/Restrictions   Precautions Precautions: ICD/Pacemaker Recall of Precautions/Restrictions: Impaired Precaution/Restrictions Comments: PACEMAKER PRECAUTIONS     Mobility Bed Mobility Overal bed mobility: Needs Assistance Bed Mobility: Supine to Sit, Sit to Supine     Supine to sit: Max assist Sit to supine: Max assist, +2 for physical assistance   General bed mobility comments: Difficulty following single step instructions    Transfers Overall transfer level: Needs assistance Equipment used: Rolling walker (2 wheels) Transfers: Sit to/from Stand Sit to Stand: Mod assist                  Balance Overall balance assessment: Needs assistance Sitting-balance support: Bilateral upper extremity supported, Single extremity supported Sitting balance-Leahy Scale: Good     Standing balance support: Bilateral upper extremity supported, Single extremity supported Standing balance-Leahy Scale: Poor                             ADL either performed or assessed with clinical judgement   ADL Overall ADL's : Needs assistance/impaired Eating/Feeding: Set up   Grooming: Oral care;Cueing for sequencing;Contact guard assist Grooming Details (indicate cue type and reason): oral care standing at sink, increase time to process task initiation and cessation Upper Body Bathing: Minimal assistance;Cueing for safety;Cueing for sequencing;Cueing for UE precautions Upper Body Bathing Details (indicate cue type and reason): task simulation - Min A with cuing for task initiation and adherance to pacemaker precautions. Lower Body Bathing: Moderate assistance;Cueing for safety;Cueing for sequencing Lower Body Bathing Details (indicate cue type and reason): task simulation - Mod A with  cuing for task initiation and adherance to  pacemaker precautions. Upper Body Dressing : Minimal assistance;Cueing for safety;Cueing for sequencing;Cueing for UE precautions Upper Body Dressing Details (indicate cue type and reason): task simulation - Min A with cuing for task initiation and adherance to pacemaker precautions. Lower Body Dressing: Moderate assistance;Cueing for safety;Cueing for sequencing;Cueing for compensatory techniques Lower Body Dressing Details (indicate cue type and reason): task simulation - Min A with cuing for task initiation and adherance to pacemaker precautions. Toilet Transfer: Moderate assistance;Cueing for sequencing;Cueing for safety;Rolling walker (2 wheels) Toilet Transfer Details (indicate cue type and reason): Mod A due to requiring BUEs to transition to standing however with pacemaker precautions requiring increase assistance for this task Toileting- Clothing Manipulation and Hygiene: Moderate assistance;Cueing for sequencing;Cueing for safety   Tub/ Shower Transfer: Moderate assistance;Cueing for sequencing Tub/Shower Transfer Details (indicate cue type and reason): task simulation - Mod A with cuing for task initiation and adherance to pacemaker precautions as patient attempts to use LUE during task engagement. Functional mobility during ADLs: Minimal assistance;Cueing for safety;Rolling walker (2 wheels);Cueing for sequencing       Vision Baseline Vision/History: 1 Wears glasses Vision Assessment?: Wears glasses for reading     Perception         Praxis         Pertinent Vitals/Pain Pain Assessment Pain Assessment: Faces Pain Score: 0-No pain Faces Pain Scale: No hurt     Extremity/Trunk Assessment             Communication     Cognition Arousal: Lethargic Behavior During Therapy: Flat affect Cognition: Cognition impaired   Orientation impairments: Person   Memory impairment (select all impairments): Short-term memory Attention impairment (select first level of  impairment): Sustained attention Executive functioning impairment (select all impairments): Initiation, Organization, Sequencing, Reasoning, Problem solving                   Following commands: Impaired Following commands impaired: Follows one step commands inconsistently     Cueing  General Comments   Cueing Techniques: Verbal cues;Tactile cues;Visual cues      Exercises     Shoulder Instructions      Home Living Family/patient expects to be discharged to:: Private residence Living Arrangements: Spouse/significant other;Children;Other relatives Available Help at Discharge: Family (Will have support from family (nephew and daughter plus additional family support wil be available at home)) Type of Home: House (3 level house, patient and spouse live on the top 2 floors without option for 1st floor set-up.) Home Access: Stairs to enter [employer] of Steps: 3 level house, patient and spouse live on the top 2 floors (other family live on the lower level) without option for 1st floor set-up.  6-9 steps to access patient's living quarters.   Home Layout: Multi-level Alternate Level Stairs-Number of Steps: 3 level house, patient and spouse live on the top 2 floors (other family live on the lower level) without option for 1st floor set-up. 6-9 steps to access patient's living quarters.   Bathroom Shower/Tub: Producer, television/film/video: Standard     Home Equipment: Agricultural consultant (2 wheels)   Additional Comments: has RW at home however never used it      Prior Functioning/Environment Prior Level of Function : Independent/Modified Independent             Mobility Comments: Independent, no DME ADLs Comments: Mod I/Indpendent    OT Problem List: Decreased strength;Decreased coordination;Decreased range of motion;Decreased cognition;Decreased activity  tolerance;Decreased safety awareness;Impaired balance (sitting and/or standing);Decreased knowledge of  use of DME or AE;Decreased knowledge of precautions   OT Treatment/Interventions: Self-care/ADL training;Therapeutic exercise;Therapeutic activities;Cognitive remediation/compensation;Energy conservation;DME and/or AE instruction;Patient/family education;Balance training      OT Goals(Current goals can be found in the care plan section)   Acute Rehab OT Goals Patient Stated Goal: patient desires to return home OT Goal Formulation: With patient/family Time For Goal Achievement: 12/29/23 Potential to Achieve Goals: Good ADL Goals Pt Will Perform Grooming: with supervision Pt Will Perform Upper Body Bathing: with min assist Pt Will Perform Lower Body Bathing: with mod assist Pt Will Perform Upper Body Dressing: with min assist Pt Will Perform Lower Body Dressing: with mod assist Pt Will Transfer to Toilet: with min assist Pt Will Perform Toileting - Clothing Manipulation and hygiene: with mod assist Pt Will Perform Tub/Shower Transfer: with mod assist   OT Frequency:  Min 2X/week    Co-evaluation              AM-PAC OT "6 Clicks" Daily Activity     Outcome Measure Help from another person eating meals?: A Little Help from another person taking care of personal grooming?: A Little Help from another person toileting, which includes using toliet, bedpan, or urinal?: A Lot Help from another person bathing (including washing, rinsing, drying)?: A Lot Help from another person to put on and taking off regular upper body clothing?: A Little Help from another person to put on and taking off regular lower body clothing?: A Lot 6 Click Score: 15   End of Session Equipment Utilized During Treatment: Gait belt;Rolling walker (2 wheels) Nurse Communication: Other (comment) (secure message to patient's RN of patient return to bed with call button and 1:1 sitter present.)  Activity Tolerance: Patient tolerated treatment well Patient left: in bed;with call bell/phone within reach;with  nursing/sitter in room;with family/visitor present (patient has 1:1 sitter present at this time.  Bed lowered to lowest setting at end of session and confirmed with sitter staff of round the clock supervision at this time.)  OT Visit Diagnosis: Unsteadiness on feet (R26.81);Other abnormalities of gait and mobility (R26.89);Muscle weakness (generalized) (M62.81)                Time: 6387-5643 OT Time Calculation (min): 40 min Charges:  OT Evaluation $OT Eval Moderate Complexity: 1 Mod  Eleri Ruben OTR/L   Lucas Rushing 12/15/2023, 3:46 PM

## 2023-12-15 NOTE — Evaluation (Signed)
 Physical Therapy Evaluation Patient Details Name: Bob Ortiz MRN: [id number] DOB: 09/03/1940 Today's Date: 12/15/2023  History of Present Illness  [name] is an 83 year old male with history of CAD with calcification, left bundle branch block, nonischemic cardiomyopathy, hypertension, chronic atrial fibrillation, on Eliquis, who presents ED for chief concerns of seizure-like activity.   Clinical Impression    Pt received in Semi-Fowler's position and agreeable to therapy.  Pt currently presents with reduced functional mobility upon evaluation, with the largest deficit being with transfers.  Once pt is upright pt mobilizes with shuffled gait, shortened strides and difficulty staying within the walker.  Pt has reduced safety awareness and tends to look at floor when ambulating.  Pt given verbal cues to correct, however pt has lack of good carryover.  Pt is able to ambulate a good distance with minimal rest break, but has significant difficulty in getting to standing position, and with lack of assistance from family members, would be best served by going to rehab facility.  Pt returned to bed and left with all needs met and call bell within reach.      If plan is discharge home, recommend the following: A lot of help with walking and/or transfers;A lot of help with bathing/dressing/bathroom;Assist for transportation;Help with stairs or ramp for entrance   Can travel by private vehicle   No    Equipment Recommendations Other (comment) (TBD at next venue of care.)  Recommendations for Other Services       Functional Status Assessment Patient has had a recent decline in their functional status and demonstrates the ability to make significant improvements in function in a reasonable and predictable amount of time.     Precautions / Restrictions Precautions Precautions: ICD/Pacemaker Recall of Precautions/Restrictions: Impaired Precaution/Restrictions Comments: PACEMAKER PRECAUTIONS       Mobility  Bed Mobility Overal bed mobility: Needs Assistance Bed Mobility: Supine to Sit, Sit to Supine     Supine to sit: Max assist Sit to supine: Max assist   General bed mobility comments: Difficulty following single step instructions    Transfers Overall transfer level: Needs assistance Equipment used: Rolling walker (2 wheels) Transfers: Sit to/from Stand Sit to Stand: Mod assist                Ambulation/Gait Ambulation/Gait assistance: Contact guard assist Gait Distance (Feet): 160 Feet Assistive device: Rolling walker (2 wheels) Gait Pattern/deviations: Step-to pattern, Shuffle Gait velocity: significantly decreased     General Gait Details: Pt with poor foot clearance bilaterally, even with verbal cuing.  Significant shuffling of gait as well.  Stairs            Wheelchair Mobility     Tilt Bed    Modified Rankin (Stroke Patients Only)       Balance Overall balance assessment: Needs assistance Sitting-balance support: Bilateral upper extremity supported, Single extremity supported Sitting balance-Leahy Scale: Good     Standing balance support: Bilateral upper extremity supported, Single extremity supported Standing balance-Leahy Scale: Poor                               Pertinent Vitals/Pain Pain Assessment Pain Assessment: Faces Faces Pain Scale: Hurts little more Pain Location: B hips Pain Descriptors / Indicators: Aching Pain Intervention(s): Limited activity within patient's tolerance, Monitored during session, Premedicated before session, Repositioned    Home Living Family/patient expects to be discharged to:: Private residence Living Arrangements: Spouse/significant other;Children;Other relatives  Available Help at Discharge: Family (Will have support from family (nephew and daughter plus additional family support wil be available at home)) Type of Home: House (3 level house, patient and spouse live on the top 2  floors without option for 1st floor set-up.) Home Access: Stairs to enter   [employer] of Steps: 3 level house, patient and spouse live on the top 2 floors (other family live on the lower level) without option for 1st floor set-up.  6-9 steps to access patient's living quarters. Alternate Level Stairs-Number of Steps: 3 level house, patient and spouse live on the top 2 floors (other family live on the lower level) without option for 1st floor set-up. 6-9 steps to access patient's living quarters. Home Layout: Multi-level Home Equipment: Agricultural consultant (2 wheels) Additional Comments: has RW at home however never used it    Prior Function Prior Level of Function : Independent/Modified Independent             Mobility Comments: Independent, no DME ADLs Comments: Mod I/Indpendent     Extremity/Trunk Assessment                Communication        Cognition Arousal: Lethargic Behavior During Therapy: Flat affect                             Following commands: Impaired Following commands impaired: Follows one step commands inconsistently     Cueing Cueing Techniques: Verbal cues, Tactile cues, Visual cues     General Comments      Exercises     Assessment/Plan    PT Assessment Patient needs continued PT services  PT Problem List Decreased strength;Decreased activity tolerance;Decreased balance;Decreased mobility;Decreased cognition;Decreased safety awareness       PT Treatment Interventions DME instruction;Gait training;Functional mobility training;Therapeutic activities;Therapeutic exercise;Balance training;Neuromuscular re-education    PT Goals (Current goals can be found in the Care Plan section)  Acute Rehab PT Goals Patient Stated Goal: to return home PT Goal Formulation: With patient Time For Goal Achievement: 12/29/23 Potential to Achieve Goals: Poor    Frequency Min 2X/week     Co-evaluation               AM-PAC PT  "6 Clicks" Mobility  Outcome Measure Help needed turning from your back to your side while in a flat bed without using bedrails?: A Lot Help needed moving from lying on your back to sitting on the side of a flat bed without using bedrails?: Total Help needed moving to and from a bed to a chair (including a wheelchair)?: A Lot Help needed standing up from a chair using your arms (e.g., wheelchair or bedside chair)?: A Little Help needed to walk in hospital room?: A Little Help needed climbing 3-5 steps with a railing? : Total 6 Click Score: 12    End of Session Equipment Utilized During Treatment: Gait belt Activity Tolerance: Patient tolerated treatment well;Patient limited by fatigue Patient left: in bed;with call bell/phone within reach;with bed alarm set Nurse Communication: Mobility status PT Visit Diagnosis: Unsteadiness on feet (R26.81);Other abnormalities of gait and mobility (R26.89);Repeated falls (R29.6);Muscle weakness (generalized) (M62.81);History of falling (Z91.81);Difficulty in walking, not elsewhere classified (R26.2)    Time: 1610-9604 PT Time Calculation (min) (ACUTE ONLY): 30 min   Charges:   PT Evaluation $PT Eval Moderate Complexity: 1 Mod PT Treatments $Therapeutic Activity: 8-22 mins PT General Charges $$ ACUTE PT VISIT: 1 Visit  Rozanna Corner, PT, DPT Physical Therapist - [facility]  12/15/23, 6:43 PM

## 2023-12-16 ENCOUNTER — Inpatient Hospital Stay

## 2023-12-16 DIAGNOSIS — J90 Pleural effusion, not elsewhere classified: Secondary | ICD-10-CM | POA: Diagnosis not present

## 2023-12-16 DIAGNOSIS — R569 Unspecified convulsions: Secondary | ICD-10-CM | POA: Diagnosis not present

## 2023-12-16 DIAGNOSIS — Z7189 Other specified counseling: Secondary | ICD-10-CM | POA: Diagnosis not present

## 2023-12-16 DIAGNOSIS — R7989 Other specified abnormal findings of blood chemistry: Secondary | ICD-10-CM | POA: Diagnosis not present

## 2023-12-16 DIAGNOSIS — I482 Chronic atrial fibrillation, unspecified: Secondary | ICD-10-CM | POA: Diagnosis not present

## 2023-12-16 DIAGNOSIS — Z515 Encounter for palliative care: Secondary | ICD-10-CM

## 2023-12-16 LAB — MAGNESIUM: Magnesium: 1.9 mg/dL (ref 1.7–2.4)

## 2023-12-16 LAB — COMPREHENSIVE METABOLIC PANEL WITH GFR
ALT: 43 U/L (ref 0–44)
AST: 43 U/L — ABNORMAL HIGH (ref 15–41)
Albumin: 3.3 g/dL — ABNORMAL LOW (ref 3.5–5.0)
Alkaline Phosphatase: 112 U/L (ref 38–126)
Anion gap: 10 (ref 5–15)
BUN: 15 mg/dL (ref 8–23)
CO2: 28 mmol/L (ref 22–32)
Calcium: 8.8 mg/dL — ABNORMAL LOW (ref 8.9–10.3)
Chloride: 97 mmol/L — ABNORMAL LOW (ref 98–111)
Creatinine, Ser: 0.81 mg/dL (ref 0.61–1.24)
GFR, Estimated: >60 mL/min (ref >60)
Glucose, Bld: 85 mg/dL (ref 70–99)
Potassium: 3.4 mmol/L — ABNORMAL LOW (ref 3.5–5.1)
Sodium: 135 mmol/L (ref 135–145)
Total Bilirubin: 1 mg/dL (ref 0.0–1.2)
Total Protein: 6.3 g/dL — ABNORMAL LOW (ref 6.5–8.1)

## 2023-12-16 MED ORDER — POTASSIUM CHLORIDE 20 MEQ PO PACK
20.0000 meq | PACK | Freq: Once | ORAL | Status: AC
  Administered 2023-12-16: 20 meq via ORAL
  Filled 2023-12-16: qty 1

## 2023-12-16 MED ORDER — HALOPERIDOL LACTATE 5 MG/ML IJ SOLN
1.0000 mg | Freq: Four times a day (QID) | INTRAMUSCULAR | Status: DC | PRN
  Administered 2023-12-16: 2 mg via INTRAMUSCULAR
  Filled 2023-12-16: qty 1

## 2023-12-16 MED ORDER — ETHACRYNIC ACID 25 MG PO TABS
50.0000 mg | ORAL_TABLET | Freq: Two times a day (BID) | ORAL | Status: AC
  Administered 2023-12-16 (×2): 50 mg via ORAL
  Filled 2023-12-16 (×2): qty 2

## 2023-12-16 MED ORDER — ETHACRYNIC ACID 25 MG PO TABS
50.0000 mg | ORAL_TABLET | Freq: Two times a day (BID) | ORAL | Status: DC
  Filled 2023-12-16: qty 2

## 2023-12-16 NOTE — Progress Notes (Signed)
 OT Cancellation Note  Patient Details Name: EMIDIO REAM MRN: [id number] DOB: 12-05-1940   Cancelled Treatment:    Reason Eval/Treat Not Completed: Fatigue/lethargy limiting ability to participate. Pt sleeping soundly. Per RN, pt has not slept in 2 days and received Haldol overnight. Will re-attempt at later date/time as pt is appropriate.   Vela Render R., MPH, MS, OTR/L ascom [phone] 12/16/23, 10:14 AM

## 2023-12-16 NOTE — Progress Notes (Signed)
 Daily Progress Note   Patient Name: Bob Ortiz       Date: 12/16/2023 DOB: 18-Jun-1941  Age: 83 y.o. MRN#: [id number] Attending Physician: Luna Salinas, MD Primary Care Physician: Alena Hush, MD Admit Date: 12/13/2023  Reason for Consultation/Follow-up: Establishing goals of care  Subjective: Notes and labs reviewed.  In to see patient.  Patient is currently resting in bed sleeping at this time.  His daughter Bob Ortiz and his wife are at bedside.  They discussed that at baseline patient is usually confused.  Daughter Bob Ortiz calls [name]. We discussed his confusion and that likely his confusion is caused by dementia.  We discussed outpatient dementia evaluation with neurology.  Family has concerns that his mental status is due to the diuretics he has been taking.  We discussed diuretic therapy and that given his cardiac status, he requires diuretics for fluid overload and breathing; quality of life.  We discussed their thoughts, and ultimately  talking with cardiology about trialing a diuretic that is not a loop.  We discussed that if his mental status does not change, and it likely will not, then they will be at a place of considering thoughts on goals of care moving forward. The difference between an aggressive medical intervention path and a comfort care path was discussed. Hospice care was discussed. Values and goals of care important to patient and family were attempted to be elicited.  Discussed limitations of medical interventions to prolong quality of life in some situations and discussed the concept of human mortality.  Family members are all of faith.  Discussed God's sovereignty.   We discussed considering a DNR status.  Family has stated the patient would not want to live in a vegetative  state.  We discussed that along with determining the outcomes of talking with cardiology regarding trial of a nonloop diuretic and having a neurology workup, they will need to consider what is an acceptable quality of life and what is not.  Recommend outpatient palliative with transition to hospice when family is ready.  Communicated with cardiology regarding family request to change diuretic drug class.  Length of Stay: 3  Current Medications: Scheduled Meds:   apixaban  5 mg Oral BID   cyanocobalamin  1,000 mcg Oral Daily   ethacrynic acid  50 mg Oral BID   potassium  chloride  20 mEq Oral Once    Continuous Infusions:   PRN Meds: acetaminophen **OR** acetaminophen, haloperidol lactate, hydrALAZINE, LORazepam, melatonin, ondansetron **OR** ondansetron (ZOFRAN) IV, mouth rinse  Physical Exam Constitutional:      Comments: Eyes closed  Pulmonary:     Effort: Pulmonary effort is normal.             Vital Signs: BP 113/89 (BP Location: Left Arm)   Pulse 68   Temp 97.6 F (36.4 C)   Resp 18   Ht 5\' 9"  (1.753 m)   Wt 77.9 kg   SpO2 98%   BMI 25.36 kg/m  SpO2: SpO2: 98 % O2 Device: O2 Device: Room Air O2 Flow Rate: O2 Flow Rate (L/min): 2 L/min  Intake/output summary:  Intake/Output Summary (Last 24 hours) at 12/16/2023 1202 Last data filed at 12/16/2023 0131 Gross per 24 hour  Intake 481 ml  Output 1350 ml  Net -869 ml   LBM: Last BM Date : 12/15/23 Baseline Weight: Weight: 83.9 kg Most recent weight: Weight: 77.9 kg   Patient Active Problem List   Diagnosis Date Noted   Seizure (HCC) 12/13/2023   Bilateral pleural effusion 12/13/2023   Elevated troponin 12/13/2023   Atrial fibrillation, chronic (HCC) 12/13/2023   Essential hypertension 12/13/2023   Transaminitis 12/13/2023   NICM (nonischemic cardiomyopathy) (HCC) 12/13/2023    Palliative Care Assessment & Plan    Recommendations/Plan: Family would like to try a nonloop diuretic as family is concerned  that his mental status is result of his diuretic therapy. Recommend outpatient evaluation by neurology for dementia. Recommend outpatient palliative to continue goals of care conversations depending on outcomes.  Recommend transition to hospice care when family is ready.  Code Status:    Code Status Orders  (From admission, onward)           Start     Ordered   12/13/23 1554  Full code  Continuous       Question:  By:  Answer:  Default: patient does not have capacity for decision making, no surrogate or prior directive available   12/13/23 1555           Code Status History     This patient has a current code status but no historical code status.      Advance Directive Documentation    Flowsheet Row Most Recent Value  Type of Advance Directive Healthcare Power of Attorney, Living will  Pre-existing out of facility DNR order (yellow form or pink MOST form) --  "MOST" Form in Place? --       Prognosis: Poor    Care plan was discussed with cardiology  Thank you for allowing the Palliative Medicine Team to assist in the care of this patient.   Meribeth Standard, NP  Please contact Palliative Medicine Team phone at [phone] for questions and concerns.

## 2023-12-16 NOTE — Care Management Important Message (Signed)
 Important Message  Patient Details  Name: Bob Ortiz MRN: [id number] Date of Birth: 08-26-1940   Important Message Given:  Yes - Medicare IM     Arihant Pennings W, CMA 12/16/2023, 11:54 AM

## 2023-12-16 NOTE — Progress Notes (Signed)
 1      PROGRESS NOTE    CARSYN NUFER  VQQ:[id number] DOB: May 05, 1941 DOA: 12/13/2023 PCP: Alena Hush, MD   Brief Narrative:   83 year old male with history of CAD with calcification, left bundle branch block, nonischemic cardiomyopathy, hypertension, chronic atrial fibrillation, on Eliquis, who presents ED for chief concerns of seizure-like activity.   5/13: Neurology, palliative care and cardiology consult  5/14: Per neurology concern of Lewy body dementia and patient need outpatient evaluation for final diagnosis.  No need for any antiseizure medications as he might be having spontaneous movements due to transient brain hypoxia with very low EF or a syncopal convulsion. PT is recommending SNF  5/15: Some overnight agitation requiring medication.  Cardiology ordered edecrin 50 mg for 2 doses and will evaluate volume status tomorrow.  Potassium at 3.4  Assessment & Plan:   Principal Problem:   Seizure (HCC) Active Problems:   Elevated troponin   Bilateral pleural effusion   Atrial fibrillation, chronic (HCC)   Essential hypertension   Transaminitis   NICM (nonischemic cardiomyopathy) (HCC)  * Seizure (HCC) Acute metabolic encephalopathy Seizure, fall, aspiration precaution Neurology following Intermittent hallucination reported in past hospital visits at [facility] and [facility].  He is also having visual hallucination here reported by nursing -CT head showing no acute intracranial abnormality -EEG showed no epileptic waves - Continue lorazepam 0.5 mg IV every 6 hours as needed for seizure - Frequent neurochecks -No indication for AED at this time per neuro -unable to rule out neuro degenerative disease -No arrhythmia reported on CRT-P -Outpatient neurology follow-up for evaluation of concerning Lewy body dementia   Elevated troponin Due to supply/demand ischemia.  No MI   NICM (nonischemic cardiomyopathy) (HCC) Complete echo with contrast on 12/06/2023: Estimate ejection  fraction is 20%, with grade 3 diastolic dysfunction Cardiology consult Cardiology started on Edecrin 50 mg for 2 doses due to daughter's concern of hallucination on Lasix Monitor volume status closely   Transaminitis Suspect secondary to acute on chronic heart failure with bilateral pleural effusion, continue to improve Treatment per above with diuresis Continue to monitor   Essential hypertension Hydralazine 5 mg IV every 6 hours.  For SBP greater than 170   Bilateral pleural effusion Left > right. Cardio seen Received IV Lasix, now being transitioned to Edecrin Strict I's and O's  GOC Overall poor prognosis. May have underlying neuro degenerative dz/dementia Palliative care c/s-family would like to keep him full code for now   DVT prophylaxis: Eliquis Place TED hose Start: 12/13/23 1553 apixaban (ELIQUIS) tablet 5 mg     Code Status: Full Code Family Communication: Discussed with daughter and wife at bedside Disposition Plan: Medically stable for SNF   Consultants:  Neuro Palliative care Cardiology  Subjective: Patient was sleeping comfortably when seen today.  He was up and little agitated overnight.  Family at bedside  Objective: Vitals:   12/15/23 0715 12/15/23 1609 12/15/23 2126 12/16/23 1222  BP: 114/70 114/68 113/89 115/88  Pulse: 71 70 68 70  Resp: 16 16 18 17   Temp: 98 F (36.7 C) 97.6 F (36.4 C) 97.6 F (36.4 C) 97.8 F (36.6 C)  TempSrc: Oral Oral    SpO2: 97% 98% 98% 95%  Weight: 77.9 kg     Height: 5\' 9"  (1.753 m)       Intake/Output Summary (Last 24 hours) at 12/16/2023 1405 Last data filed at 12/16/2023 0131 Gross per 24 hour  Intake 481 ml  Output 1100 ml  Net -  619 ml   Filed Weights   12/13/23 1901 12/15/23 0715  Weight: 83.9 kg 77.9 kg    Examination:  General.  Frail elderly man, in no acute distress. Pulmonary.  Lungs clear bilaterally, normal respiratory effort. CV.  Regular rate and rhythm, no JVD, rub or murmur. Abdomen.   Soft, nontender, nondistended, BS positive. CNS.  Sleeping Extremities.  No edema, no cyanosis, pulses intact and symmetrical.  Data Reviewed: I have personally reviewed following labs and imaging studies  CBC: Recent Labs  Lab 12/13/23 1344 12/14/23 0451 12/15/23 0543  WBC 9.2 6.9 7.6  NEUTROABS 6.8  --   --   HGB 13.6 12.1* 12.2*  HCT 42.6 36.4* 35.8*  MCV 100.0 94.8 91.3  PLT 207 189 184   Basic Metabolic Panel: Recent Labs  Lab 12/13/23 1344 12/14/23 0451 12/15/23 0543 12/16/23 0551  NA 130* 130* 134* 135  K 3.6 3.5 3.4* 3.4*  CL 96* 101 97* 97*  CO2 23 22 29 28   GLUCOSE 100* 64* 85 85  BUN 13 10 13 15   CREATININE 0.92 0.74 0.90 0.81  CALCIUM 8.4* 7.4* 8.6* 8.8*  MG 2.1 2.0 2.0 1.9  PHOS 3.9  --   --   --    GFR: Estimated Creatinine Clearance: 70.3 mL/min (by C-G formula based on SCr of 0.81 mg/dL). Liver Function Tests: Recent Labs  Lab 12/13/23 1344 12/14/23 0451 12/15/23 0543 12/16/23 0551  AST 105* 69* 57* 43*  ALT 61* 47* 49* 43  ALKPHOS 131* 105 116 112  BILITOT 0.9 1.1 1.1 1.0  PROT 6.4* 5.4* 6.2* 6.3*  ALBUMIN 3.1* 2.7* 3.1* 3.3*   No results for input(s): "LIPASE", "AMYLASE" in the last 168 hours. No results for input(s): "AMMONIA" in the last 168 hours. Coagulation Profile: Recent Labs  Lab 12/13/23 1344  INR 1.4*    Thyroid Function Tests: Recent Labs    12/14/23 0451  TSH 4.346  FREET4 0.82   Anemia Panel: No results for input(s): "VITAMINB12", "FOLATE", "FERRITIN", "TIBC", "IRON", "RETICCTPCT" in the last 72 hours. Sepsis Labs: Recent Labs  Lab 12/13/23 1617 12/13/23 1846  LATICACIDVEN 1.7 1.0    Recent Results (from the past 240 hours)  Culture, blood (routine x 2)     Status: None (Preliminary result)   Collection Time: 12/13/23  4:17 PM   Specimen: BLOOD  Result Value Ref Range Status   Specimen Description BLOOD BLOOD RIGHT FOREARM  Final   Special Requests   Final    BOTTLES DRAWN AEROBIC AND ANAEROBIC Blood  Culture adequate volume   Culture   Final    NO GROWTH 3 DAYS Performed at [facility], [address] [place], [place] [address]    Report Status PENDING  Incomplete  Culture, blood (routine x 2)     Status: None (Preliminary result)   Collection Time: 12/13/23  4:18 PM   Specimen: BLOOD  Result Value Ref Range Status   Specimen Description BLOOD BLOOD RIGHT FOREARM  Final   Special Requests   Final    BOTTLES DRAWN AEROBIC AND ANAEROBIC Blood Culture adequate volume   Culture   Final    NO GROWTH 3 DAYS Performed at [facility], [address], [place], [place] [address]    Report Status PENDING  Incomplete     Radiology Studies: DG Chest Port 1 View Result Date: 12/16/2023 CLINICAL DATA:  [id number] Congestive heart failure (HCC) 601093 EXAM: PORTABLE CHEST 1 VIEW COMPARISON:  Dec 13, 2023 FINDINGS:  The cardiomediastinal silhouette is unchanged and enlarged in contour.LEFT chest cardiac pacing device. Atherosclerotic calcifications. Small LEFT pleural effusion, similar in comparison to prior. No pneumothorax. Favored slightly improved hilar interstitial prominence. IMPRESSION: 1. Favored slightly improved pulmonary edema. 2. Small LEFT pleural effusion, similar in comparison to prior. Electronically Signed   By: Clancy Crimes M.D.   On: 12/16/2023 07:49   ECHOCARDIOGRAM COMPLETE Result Date: 12/15/2023    ECHOCARDIOGRAM REPORT   Patient Name:   Bob Ortiz Date of Exam: 12/14/2023 Medical Rec #:  [id number]    Height:       69.0 in Accession #:    [id number]   Weight:       185.0 lb Date of Birth:  1941/05/21     BSA:          1.999 m Patient Age:    83 years     BP:           122/85 mmHg Patient Gender: M            HR:           70 bpm. Exam Location:  [facility] Procedure: 2D Echo, Cardiac Doppler and Color Doppler (Both Spectral and Color            Flow Doppler were utilized during procedure). Indications:     I46.9 Cardiac Arrest  History:         Patient has no  prior history of Echocardiogram examinations.  Sonographer:     Brigid Canada RDCS Referring Phys:  [id number] CARALYN HUDSON Diagnosing Phys: Sabina Custovic IMPRESSIONS  1. Left ventricular ejection fraction, by estimation, is <20%. The left ventricle has severely decreased function. The left ventricle demonstrates global hypokinesis. The left ventricular internal cavity size was moderately dilated. Left ventricular diastolic parameters are consistent with Grade III diastolic dysfunction (restrictive).  2. Right ventricular systolic function is mildly reduced. The right ventricular size is mildly enlarged. There is mildly elevated pulmonary artery systolic pressure. The estimated right ventricular systolic pressure is 36.5 mmHg.  3. Left atrial size was severely dilated.  4. Right atrial size was moderately dilated.  5. Large pleural effusion.  6. The mitral valve is abnormal. Severe mitral valve regurgitation. No evidence of mitral stenosis.  7. The tricuspid valve is abnormal. Tricuspid valve regurgitation is moderate.  8. The aortic valve is normal in structure. Aortic valve regurgitation is not visualized. No aortic stenosis is present.  9. There is moderate dilatation of the ascending aorta. 10. The inferior vena cava is normal in size with greater than 50% respiratory variability, suggesting right atrial pressure of 3 mmHg. FINDINGS  Left Ventricle: Left ventricular ejection fraction, by estimation, is <20%. The left ventricle has severely decreased function. The left ventricle demonstrates global hypokinesis. The left ventricular internal cavity size was moderately dilated. There is no left ventricular hypertrophy. Left ventricular diastolic parameters are consistent with Grade III diastolic dysfunction (restrictive). Right Ventricle: The right ventricular size is mildly enlarged. No increase in right ventricular wall thickness. Right ventricular systolic function is mildly reduced. There is mildly  elevated pulmonary artery systolic pressure. The tricuspid regurgitant  velocity is 2.67 m/s, and with an assumed right atrial pressure of 8 mmHg, the estimated right ventricular systolic pressure is 36.5 mmHg. Left Atrium: Left atrial size was severely dilated. Right Atrium: Right atrial size was moderately dilated. Pericardium: There is no evidence of pericardial effusion. Mitral Valve: The mitral valve is abnormal. Severe mitral valve regurgitation. No evidence  of mitral valve stenosis. Tricuspid Valve: The tricuspid valve is abnormal. Tricuspid valve regurgitation is moderate . No evidence of tricuspid stenosis. Aortic Valve: The aortic valve is normal in structure. Aortic valve regurgitation is not visualized. No aortic stenosis is present. Pulmonic Valve: The pulmonic valve was normal in structure. Pulmonic valve regurgitation is not visualized. No evidence of pulmonic stenosis. Aorta: There is moderate dilatation of the ascending aorta. Venous: The inferior vena cava is normal in size with greater than 50% respiratory variability, suggesting right atrial pressure of 3 mmHg. IAS/Shunts: No atrial level shunt detected by color flow Doppler. Additional Comments: There is a large pleural effusion.  LEFT VENTRICLE PLAX 2D LVIDd:         6.40 cm   Diastology LVIDs:         5.50 cm   LV e' medial:    6.24 cm/s LV PW:         1.00 cm   LV E/e' medial:  11.4 LV IVS:        0.90 cm   LV e' lateral:   6.25 cm/s LVOT diam:     2.30 cm   LV E/e' lateral: 11.4 LV SV:         47 LV SV Index:   23 LVOT Area:     4.15 cm  RIGHT VENTRICLE            IVC RV Basal diam:  5.40 cm    IVC diam: 1.90 cm RV S prime:     8.98 cm/s TAPSE (M-mode): 1.4 cm LEFT ATRIUM              Index        RIGHT ATRIUM           Index LA diam:        5.10 cm  2.55 cm/m   RA Area:     24.00 cm LA Vol (A2C):   135.0 ml 67.55 ml/m  RA Volume:   77.80 ml  38.93 ml/m LA Vol (A4C):   106.0 ml 53.04 ml/m LA Biplane Vol: 123.0 ml 61.54 ml/m  AORTIC  VALVE LVOT Vmax:   64.20 cm/s LVOT Vmean:  40.900 cm/s LVOT VTI:    0.113 m  AORTA Ao Root diam: 4.00 cm Ao Asc diam:  4.50 cm MITRAL VALVE               TRICUSPID VALVE MV Area (PHT): 4.49 cm    TR Peak grad:   28.5 mmHg MV Decel Time: 169 msec    TR Vmax:        267.00 cm/s MR Peak grad: 85.7 mmHg MR Mean grad: 57.0 mmHg    SHUNTS MR Vmax:      463.00 cm/s  Systemic VTI:  0.11 m MR Vmean:     356.0 cm/s   Systemic Diam: 2.30 cm MV E velocity: 71.30 cm/s MV A velocity: 26.70 cm/s MV E/A ratio:  2.67 Sabina Custovic Electronically signed by Isabell Manzanilla Signature Date/Time: 12/15/2023/1:54:24 PM    Final    EEG adult Result Date: 12/14/2023 Arleene Lack, MD     12/14/2023  4:55 PM Patient Name: BLISS FESSEL MRN: [id number] Epilepsy Attending: Arleene Lack Referring Physician/Provider: Kimberley Penman, MD Date: 12/14/2023 Duration: 26.25 mins Patient history: 83yo M with seizure like activity. EEG to evaluate for seizure Level of alertness: Awake AEDs during EEG study: None Technical aspects: This EEG study was done with scalp electrodes positioned  according to the 10-20 International system of electrode placement. Electrical activity was reviewed with band pass filter of 1-70Hz , sensitivity of 7 uV/mm, display speed of 24mm/sec with a 60Hz  notched filter applied as appropriate. EEG data were recorded continuously and digitally stored.  Video monitoring was available and reviewed as appropriate. Description: The posterior dominant rhythm consists of 8 Hz activity of moderate voltage (25-35 uV) seen predominantly in posterior head regions, symmetric and reactive to eye opening and eye closing. Hyperventilation and photic stimulation were not performed.   IMPRESSION: This study is within normal limits. No seizures or epileptiform discharges were seen throughout the recording. A normal interictal EEG does not exclude the diagnosis of epilepsy. Priyanka O Yadav        Scheduled Meds:  apixaban  5 mg  Oral BID   cyanocobalamin  1,000 mcg Oral Daily   ethacrynic acid  50 mg Oral BID   Continuous Infusions:   LOS: 3 days   Time spent: 50 mins  Luna Salinas, MD [facility] Pager [phone]  If 7PM-7AM, please contact night-coverage [email or web address]  12/16/2023, 2:05 PM

## 2023-12-16 NOTE — Progress Notes (Signed)
 [facility] CLINIC CARDIOLOGY PROGRESS NOTE       Patient ID: Bob Ortiz MRN: [id number] DOB/AGE: 82-Jul-1942 82 y.o.  Admit date: 12/13/2023 Referring Physician Dr. Seleta Dakins Primary Physician Alena Hush, MD Primary Cardiologist Dr. Beau Bound Reason for Consultation Elevated Troponins   HPI: Bob Ortiz is a 83 y.o. male  with a past medical history of chronic HFrEF, nonischemic cardiomyopathy LBBB s/p CRT-P ([employer] X4 CRTP -12/08/23), hypertension, coronary artery disease, paroxsymal atrial fibrillation (Eliquis), type 2 diabetes mellitus who presented to the ED on 12/13/2023 for concern for seizure like activity. Patient had biventricular pacemaker placement on 12/08/2023. Cardiology was consulted for further evaluation.   Interval History: -Patient seen and examined this AM and laying comfortably in hospital bed.  -Patients BP and HR stable this AM. Patient not on tele.  -UOP yesterday 1.7L s/p IV lasix 40 BID,with stable renal function -Na improved this AM at 135, K 3.4. -Patient remains on room air with stable SpO2.  -Per RN, pt received haloperidol due to agitation/aggression.    Review of systems complete and found to be negative unless listed above    History reviewed. No pertinent past medical history.  Past Surgical History:  Procedure Laterality Date   FRACTURE SURGERY Right    shoulder    Medications Prior to Admission  Medication Sig Dispense Refill Last Dose/Taking   Cholecalciferol (VITAMIN D-1000 MAX ST) 25 MCG (1000 UT) tablet Take 1,000 Units by mouth daily.   12/13/2023   Cholecalciferol 50 MCG (2000 UT) TABS Take 2,000 Units by mouth daily.   12/13/2023   cyanocobalamin (VITAMIN B12) 1000 MCG tablet Take 1,000 mcg by mouth daily.   12/13/2023   ELIQUIS 5 MG TABS tablet Take 5 mg by mouth 2 (two) times daily.   12/13/2023   furosemide (LASIX) 40 MG tablet Take 40 mg by mouth daily.   12/13/2023   magnesium oxide (MAG-OX) 400 (240 Mg) MG tablet Take 2  tablets by mouth daily.   12/13/2023   Melatonin 1 MG CHEW Chew 1 tablet by mouth daily as needed.   12/12/2023   amiodarone (PACERONE) 200 MG tablet Take 200 mg by mouth 2 (two) times daily. (Patient not taking: Reported on 12/13/2023)   Not Taking   bumetanide (BUMEX) 1 MG tablet Take by mouth. (Patient not taking: Reported on 12/13/2023)   Not Taking   carvedilol (COREG) 3.125 MG tablet Take 3.125 mg by mouth in the morning and at bedtime. (Patient not taking: Reported on 12/13/2023)   Not Taking   empagliflozin (JARDIANCE) 10 MG TABS tablet Take 1 tablet by mouth daily. (Patient not taking: Reported on 12/13/2023)   Not Taking   ENTRESTO 24-26 MG Take 1 tablet by mouth 2 (two) times daily. (Patient not taking: Reported on 12/13/2023)   Not Taking   eplerenone (INSPRA) 25 MG tablet Take 1 tablet by mouth daily. (Patient not taking: Reported on 12/13/2023)   Not Taking   losartan (COZAAR) 100 MG tablet Take 100 mg by mouth daily. (Patient not taking: Reported on 12/13/2023)   Not Taking   metoprolol succinate (TOPROL-XL) 25 MG 24 hr tablet Take 12.5 mg by mouth daily. (Patient not taking: Reported on 12/13/2023)   Not Taking   omeprazole (PRILOSEC) 40 MG capsule Take 40 mg by mouth daily. (Patient not taking: Reported on 12/13/2023)   Not Taking   predniSONE (DELTASONE) 5 MG tablet Take 1 tablet by mouth daily. (Patient not taking: Reported on 12/13/2023)   Not  Taking   spironolactone (ALDACTONE) 25 MG tablet Take by mouth. (Patient not taking: Reported on 12/13/2023)   Not Taking   thiamine (VITAMIN B1) 100 MG tablet Take 100 mg by mouth daily. (Patient not taking: Reported on 12/13/2023)   Not Taking   torsemide (DEMADEX) 20 MG tablet Take 20 mg by mouth daily. (Patient not taking: Reported on 12/13/2023)   Not Taking   traMADol  (ULTRAM ) 50 MG tablet Take 1 tablet (50 mg total) by mouth 2 (two) times daily. (Patient not taking: Reported on 12/13/2023) 10 tablet 0 Not Taking   Social History   Socioeconomic  History   Marital status: Married    Spouse name: Not on file   Number of children: Not on file   Years of education: Not on file   Highest education level: Not on file  Occupational History   Not on file  Tobacco Use   Smoking status: Former    Current packs/day: 0.00    Types: Cigarettes    Quit date: 98    Years since quitting: 50.4   Smokeless tobacco: Never  Vaping Use   Vaping status: Never Used  Substance and Sexual Activity   Alcohol use: Yes    Comment: occassionally   Drug use: Never   Sexual activity: Not on file  Other Topics Concern   Not on file  Social History Narrative   Not on file   Social Drivers of Health   Financial Resource Strain: Low Risk  (12/04/2023)   Received from [facility]   Overall Financial Resource Strain (CARDIA)    Difficulty of Paying Living Expenses: Not hard at all  Food Insecurity: No Food Insecurity (12/13/2023)   Hunger Vital Sign    Worried About Running Out of Food in the Last Year: Never true    Ran Out of Food in the Last Year: Never true  Transportation Needs: No Transportation Needs (12/13/2023)   PRAPARE - Administrator, Civil Service (Medical): No    Lack of Transportation (Non-Medical): No  Physical Activity: Sufficiently Active (11/01/2023)   Received from [facility]   Exercise Vital Sign    Days of Exercise per Week: 4 days    Minutes of Exercise per Session: 40 min  Stress: Not on file  Social Connections: Moderately Isolated (12/13/2023)   Social Connection and Isolation Panel [NHANES]    Frequency of Communication with Friends and Family: More than three times a week    Frequency of Social Gatherings with Friends and Family: More than three times a week    Attends Religious Services: Never    Database administrator or Organizations: No    Attends Banker Meetings: Never    Marital Status: Married  Catering manager Violence: Patient Unable To Answer  (12/13/2023)   Humiliation, Afraid, Rape, and Kick questionnaire    Fear of Current or Ex-Partner: Patient unable to answer    Emotionally Abused: Patient unable to answer    Physically Abused: Patient unable to answer    Sexually Abused: Patient unable to answer    Family History  Problem Relation Age of Onset   Stomach cancer Mother    Heart attack Father 68   Stroke Father      Vitals:   12/14/23 2226 12/15/23 0715 12/15/23 1609 12/15/23 2126  BP: (!) 112/59 114/70 114/68 113/89  Pulse: 82 71 70 68  Resp:  16 16 18   Temp: 97.8 F (36.6  C) 98 F (36.7 C) 97.6 F (36.4 C) 97.6 F (36.4 C)  TempSrc:  Oral Oral   SpO2: 99% 97% 98% 98%  Weight:  77.9 kg    Height:  5\' 9"  (1.753 m)      PHYSICAL EXAM General: chronically ill appearing elderly male, well nourished, in no acute distress. HEENT: Normocephalic and atraumatic. Neck: No JVD.   Lungs: Normal respiratory effort on room air. Clear bilaterally to auscultation. No wheezes, crackles, rhonchi.  Heart: HRRR, paced. Normal S1 and S2 without gallops or murmurs.  Abdomen: Non-distended appearing.  Msk: Normal strength and tone for age. Extremities: Warm and well perfused. No clubbing, cyanosis. No edema.  Neuro: Alert and oriented X 3. Psych: Answers questions appropriately.   Labs: Basic Metabolic Panel: Recent Labs    12/13/23 1344 12/14/23 0451 12/15/23 0543 12/16/23 0551  NA 130*   < > 134* 135  K 3.6   < > 3.4* 3.4*  CL 96*   < > 97* 97*  CO2 23   < > 29 28  GLUCOSE 100*   < > 85 85  BUN 13   < > 13 15  CREATININE 0.92   < > 0.90 0.81  CALCIUM 8.4*   < > 8.6* 8.8*  MG 2.1   < > 2.0 1.9  PHOS 3.9  --   --   --    < > = values in this interval not displayed.   Liver Function Tests: Recent Labs    12/15/23 0543 12/16/23 0551  AST 57* 43*  ALT 49* 43  ALKPHOS 116 112  BILITOT 1.1 1.0  PROT 6.2* 6.3*  ALBUMIN 3.1* 3.3*   No results for input(s): "LIPASE", "AMYLASE" in the last 72  hours. CBC: Recent Labs    12/13/23 1344 12/14/23 0451 12/15/23 0543  WBC 9.2 6.9 7.6  NEUTROABS 6.8  --   --   HGB 13.6 12.1* 12.2*  HCT 42.6 36.4* 35.8*  MCV 100.0 94.8 91.3  PLT 207 189 184   Cardiac Enzymes: Recent Labs    12/13/23 1344 12/13/23 1547  TROPONINIHS 322* 405*   BNP: Recent Labs    12/13/23 1344  BNP 780.7*   D-Dimer: No results for input(s): "DDIMER" in the last 72 hours. Hemoglobin A1C: No results for input(s): "HGBA1C" in the last 72 hours. Fasting Lipid Panel: No results for input(s): "CHOL", "HDL", "LDLCALC", "TRIG", "CHOLHDL", "LDLDIRECT" in the last 72 hours. Thyroid Function Tests: Recent Labs    12/14/23 0451  TSH 4.346   Anemia Panel: No results for input(s): "VITAMINB12", "FOLATE", "FERRITIN", "TIBC", "IRON", "RETICCTPCT" in the last 72 hours.   Radiology: [facility] Chest Port 1 View Result Date: 12/16/2023 CLINICAL DATA:  [id number] Congestive heart failure (HCC) [id number] EXAM: PORTABLE CHEST 1 VIEW COMPARISON:  Dec 13, 2023 FINDINGS: The cardiomediastinal silhouette is unchanged and enlarged in contour.LEFT chest cardiac pacing device. Atherosclerotic calcifications. Small LEFT pleural effusion, similar in comparison to prior. No pneumothorax. Favored slightly improved hilar interstitial prominence. IMPRESSION: 1. Favored slightly improved pulmonary edema. 2. Small LEFT pleural effusion, similar in comparison to prior. Electronically Signed   By: Clancy Crimes M.D.   On: 12/16/2023 07:49   ECHOCARDIOGRAM COMPLETE Result Date: 12/15/2023    ECHOCARDIOGRAM REPORT   Patient Name:   Bob Ortiz Date of Exam: 12/14/2023 Medical Rec #:  [id number]    Height:       69.0 in Accession #:    [id number]   Weight:  185.0 lb Date of Birth:  Sep 19, 1940     BSA:          1.999 m Patient Age:    83 years     BP:           122/85 mmHg Patient Gender: M            HR:           70 bpm. Exam Location:  [facility] Procedure: 2D Echo, Cardiac Doppler and Color Doppler  (Both Spectral and Color            Flow Doppler were utilized during procedure). Indications:     I46.9 Cardiac Arrest  History:         Patient has no prior history of Echocardiogram examinations.  Sonographer:     Brigid Canada RDCS Referring Phys:  [id number] CARALYN HUDSON Diagnosing Phys: Sabina Custovic IMPRESSIONS  1. Left ventricular ejection fraction, by estimation, is <20%. The left ventricle has severely decreased function. The left ventricle demonstrates global hypokinesis. The left ventricular internal cavity size was moderately dilated. Left ventricular diastolic parameters are consistent with Grade III diastolic dysfunction (restrictive).  2. Right ventricular systolic function is mildly reduced. The right ventricular size is mildly enlarged. There is mildly elevated pulmonary artery systolic pressure. The estimated right ventricular systolic pressure is 36.5 mmHg.  3. Left atrial size was severely dilated.  4. Right atrial size was moderately dilated.  5. Large pleural effusion.  6. The mitral valve is abnormal. Severe mitral valve regurgitation. No evidence of mitral stenosis.  7. The tricuspid valve is abnormal. Tricuspid valve regurgitation is moderate.  8. The aortic valve is normal in structure. Aortic valve regurgitation is not visualized. No aortic stenosis is present.  9. There is moderate dilatation of the ascending aorta. 10. The inferior vena cava is normal in size with greater than 50% respiratory variability, suggesting right atrial pressure of 3 mmHg. FINDINGS  Left Ventricle: Left ventricular ejection fraction, by estimation, is <20%. The left ventricle has severely decreased function. The left ventricle demonstrates global hypokinesis. The left ventricular internal cavity size was moderately dilated. There is no left ventricular hypertrophy. Left ventricular diastolic parameters are consistent with Grade III diastolic dysfunction (restrictive). Right Ventricle: The right  ventricular size is mildly enlarged. No increase in right ventricular wall thickness. Right ventricular systolic function is mildly reduced. There is mildly elevated pulmonary artery systolic pressure. The tricuspid regurgitant  velocity is 2.67 m/s, and with an assumed right atrial pressure of 8 mmHg, the estimated right ventricular systolic pressure is 36.5 mmHg. Left Atrium: Left atrial size was severely dilated. Right Atrium: Right atrial size was moderately dilated. Pericardium: There is no evidence of pericardial effusion. Mitral Valve: The mitral valve is abnormal. Severe mitral valve regurgitation. No evidence of mitral valve stenosis. Tricuspid Valve: The tricuspid valve is abnormal. Tricuspid valve regurgitation is moderate . No evidence of tricuspid stenosis. Aortic Valve: The aortic valve is normal in structure. Aortic valve regurgitation is not visualized. No aortic stenosis is present. Pulmonic Valve: The pulmonic valve was normal in structure. Pulmonic valve regurgitation is not visualized. No evidence of pulmonic stenosis. Aorta: There is moderate dilatation of the ascending aorta. Venous: The inferior vena cava is normal in size with greater than 50% respiratory variability, suggesting right atrial pressure of 3 mmHg. IAS/Shunts: No atrial level shunt detected by color flow Doppler. Additional Comments: There is a large pleural effusion.  LEFT VENTRICLE PLAX 2D LVIDd:  6.40 cm   Diastology LVIDs:         5.50 cm   LV e' medial:    6.24 cm/s LV PW:         1.00 cm   LV E/e' medial:  11.4 LV IVS:        0.90 cm   LV e' lateral:   6.25 cm/s LVOT diam:     2.30 cm   LV E/e' lateral: 11.4 LV SV:         47 LV SV Index:   23 LVOT Area:     4.15 cm  RIGHT VENTRICLE            IVC RV Basal diam:  5.40 cm    IVC diam: 1.90 cm RV S prime:     8.98 cm/s TAPSE (M-mode): 1.4 cm LEFT ATRIUM              Index        RIGHT ATRIUM           Index LA diam:        5.10 cm  2.55 cm/m   RA Area:     24.00 cm  LA Vol (A2C):   135.0 ml 67.55 ml/m  RA Volume:   77.80 ml  38.93 ml/m LA Vol (A4C):   106.0 ml 53.04 ml/m LA Biplane Vol: 123.0 ml 61.54 ml/m  AORTIC VALVE LVOT Vmax:   64.20 cm/s LVOT Vmean:  40.900 cm/s LVOT VTI:    0.113 m  AORTA Ao Root diam: 4.00 cm Ao Asc diam:  4.50 cm MITRAL VALVE               TRICUSPID VALVE MV Area (PHT): 4.49 cm    TR Peak grad:   28.5 mmHg MV Decel Time: 169 msec    TR Vmax:        267.00 cm/s MR Peak grad: 85.7 mmHg MR Mean grad: 57.0 mmHg    SHUNTS MR Vmax:      463.00 cm/s  Systemic VTI:  0.11 m MR Vmean:     356.0 cm/s   Systemic Diam: 2.30 cm MV E velocity: 71.30 cm/s MV A velocity: 26.70 cm/s MV E/A ratio:  2.67 Sabina Custovic Electronically signed by Isabell Manzanilla Signature Date/Time: 12/15/2023/1:54:24 PM    Final    EEG adult Result Date: 12/14/2023 Arleene Lack, MD     12/14/2023  4:55 PM Patient Name: Bob Ortiz MRN: [id number] Epilepsy Attending: Arleene Lack Referring Physician/Provider: Kimberley Penman, MD Date: 12/14/2023 Duration: 26.25 mins Patient history: 83yo M with seizure like activity. EEG to evaluate for seizure Level of alertness: Awake AEDs during EEG study: None Technical aspects: This EEG study was done with scalp electrodes positioned according to the 10-20 International system of electrode placement. Electrical activity was reviewed with band pass filter of 1-70Hz , sensitivity of 7 uV/mm, display speed of 85mm/sec with a 60Hz  notched filter applied as appropriate. EEG data were recorded continuously and digitally stored.  Video monitoring was available and reviewed as appropriate. Description: The posterior dominant rhythm consists of 8 Hz activity of moderate voltage (25-35 uV) seen predominantly in posterior head regions, symmetric and reactive to eye opening and eye closing. Hyperventilation and photic stimulation were not performed.   IMPRESSION: This study is within normal limits. No seizures or epileptiform discharges were seen  throughout the recording. A normal interictal EEG does not exclude the diagnosis of epilepsy. Priyanka O Yadav  US  ABDOMEN LIMITED RUQ (LIVER/GB) Result Date: 12/13/2023 CLINICAL DATA:  Transaminitis EXAM: ULTRASOUND ABDOMEN LIMITED RIGHT UPPER QUADRANT COMPARISON:  Chest x-ray and chest CTA earlier 12/13/2023 FINDINGS: Gallbladder: Previous cholecystectomy. Common bile duct: Diameter: 2 mm Liver: No focal lesion identified. Within normal limits in parenchymal echogenicity. Portal vein is patent on color Doppler imaging with normal direction of blood flow towards the liver. Other: Right pleural effusion. Please correlate with prior x-ray and CTA. IMPRESSION: Previous cholecystectomy.  No ductal dilatation. Electronically Signed   By: Adrianna Horde M.D.   On: 12/13/2023 16:39   CT Head Wo Contrast Result Date: 12/13/2023 CLINICAL DATA:  Provided history: Mental status change, unknown cause. EXAM: CT HEAD WITHOUT CONTRAST TECHNIQUE: Contiguous axial images were obtained from the base of the skull through the vertex without intravenous contrast. RADIATION DOSE REDUCTION: This exam was performed according to the departmental dose-optimization program which includes automated exposure control, adjustment of the mA and/or kV according to patient size and/or use of iterative reconstruction technique. COMPARISON:  Head CT 09/01/2023. FINDINGS: Brain: Generalized cerebral atrophy. Patchy and ill-defined hypoattenuation within the cerebral white matter, nonspecific but compatible with mild chronic small vessel ischemic disease. There is no acute intracranial hemorrhage. No demarcated cortical infarct. No extra-axial fluid collection. No evidence of an intracranial mass. No midline shift. Vascular: No hyperdense vessel.  Atherosclerotic calcifications. Skull: No calvarial fracture or aggressive osseous lesion. Visible sinuses/orbits: No mass or acute finding within the imaged orbits. Minimal mucosal thickening within the  right ethmoid and right sphenoid sinuses. Other: Trace left mastoid effusion. IMPRESSION: 1. No evidence of an acute intracranial abnormality. 2. Parenchymal atrophy and chronic small vessel ischemic disease. 3. Minor paranasal sinus mucosal thickening at the imaged levels. 4. Trace left mastoid effusion. Electronically Signed   By: Bascom Lily D.O.   On: 12/13/2023 15:36   DG Chest 1 View Result Date: 12/13/2023 CLINICAL DATA:  Unresponsiveness EXAM: CHEST  1 VIEW COMPARISON:  March 22, 2017 FINDINGS: No change in the left subclavian bipolar ICDF pacemaker device tip of the leads in good position no evidence of discontinuity Bilateral interstitial prominence and small left effusion could correlate with congestive changes. Heart upper limits of normal. No pneumothorax. IMPRESSION: Congestive changes. Electronically Signed   By: Fredrich Jefferson M.D.   On: 12/13/2023 15:14   CT Angio Chest PE W/Cm &/Or Wo Cm Result Date: 12/13/2023 CLINICAL DATA:  Pulmonary embolism EXAM: CT ANGIOGRAPHY CHEST WITH CONTRAST TECHNIQUE: Multidetector CT imaging of the chest was performed using the standard protocol during bolus administration of intravenous contrast. Multiplanar CT image reconstructions and MIPs were obtained to evaluate the vascular anatomy. RADIATION DOSE REDUCTION: This exam was performed according to the departmental dose-optimization program which includes automated exposure control, adjustment of the mA and/or kV according to patient size and/or use of iterative reconstruction technique. CONTRAST:  75mL OMNIPAQUE IOHEXOL 350 MG/ML SOLN COMPARISON:  None Available. FINDINGS: Cardiovascular: Satisfactory opacification of the pulmonary arteries to the segmental level. No evidence of pulmonary embolism. Normal heart size. No pericardial effusion. Coronary artery calcifications, small to medium-sized pericardial effusion Mediastinum/Nodes: No enlarged mediastinal, hilar, or axillary lymph nodes. Thyroid gland,  trachea, and esophagus demonstrate no significant findings. Lungs/Pleura: Bilateral pleural effusions, left larger than right with basilar atelectasis. Interstitial prominence with ground-glass appearance could correlate with congestive changes versus superimposed pneumonitis. No evidence of pulmonary embolism. No aortic dissection on this limited enhanced pulmonary CT angiogram images Upper Abdomen: No acute abnormality. Musculoskeletal: No chest wall abnormality. No  acute or significant osseous findings. Review of the MIP images confirms the above findings. IMPRESSION: *No evidence of pulmonary embolism. *Bilateral pleural effusions, left larger than right with basilar atelectasis. *Interstitial prominence with ground-glass appearance could correlate with congestive changes versus superimposed pneumonitis. *Coronary artery calcifications, small to medium-sized pericardial effusion. Electronically Signed   By: Fredrich Jefferson M.D.   On: 12/13/2023 15:09    ECHO as above  TELEMETRY reviewed by me 12/16/2023: Not on tele  EKG reviewed by me: V-paced, rate 70 bpm  Data reviewed by me 12/16/2023: last 24h vitals tele labs imaging I/O hospitalist note  Principal Problem:   Seizure [facility]) Active Problems:   Bilateral pleural effusion   Elevated troponin   Atrial fibrillation, chronic (HCC)   Essential hypertension   Transaminitis   NICM (nonischemic cardiomyopathy) (HCC)    ASSESSMENT AND PLAN:   Bob Ortiz is a 83 y.o. male  with a past medical history of chronic HFrEF, nonischemic cardiomyopathy LBBB s/p CRT-P ([employer] X4 CRTP -12/08/23), hypertension, coronary artery disease, paroxsymal atrial fibrillation (Eliquis), type 2 diabetes mellitus who presented to the ED on 12/13/2023 for concern for seizure like activity. Patient had biventricular pacemaker placement on 12/08/2023. Cardiology was consulted for further evaluation.   # Seizure like activity # Nonischemic cardiomyopathy, LBBB s/p  CRT-P (12/08/23) # Paroxsymal atrial fibrillation  # Hypertension # Demand Ischemia # Acute on chronic HFrEF Patient with recent CRT-P placement on 05/07 reports to ED via EMS due to having seizure-like activity, chest compressions initiated by daughter at home. EMS arrived and patient had pulse and responsive. BNP elevated 780. Trops elevated and trended 322 > 405. CXR and CT reveals bilateral pleural effusions. EKG in ED with V-paced, rate 70 bpm. Investigated patients CRT-P at bedside with [employer] device with evidence of arrhthymias or significant events found. EEG study within normal limits. Echo this admission with EF < 20%, global hypokinesis, severe MR, moderate TR, large pleural effusion. Echo this admission similar to previous echo on 12/06/23. Patient BP and HR stable -Continue Eliquis 5 mg BID for stroke risk reduction. -Due to allergies/hx of hallucinations to all loop diuretics (lasix, torsemide, bumex) and spirolactone. Diuretic options are limited. Closely monitor renal function and UOP.          -Pt's daughter reports pt hallucinates with all loop diuretics  -Start Edecrin 50 mg BID for 2 doses today. Will evaluate volume status tomorrow.  -Monitor and replenish electrolytes for a goal K >4, Mag >2. -Elevated troponins most consistent with demand/supply mismatch and not ACS.   This patient's plan of care was discussed and created with Dr. Custovic and she is in agreement.  Signed: Creighton Doffing, PA-C  12/16/2023, 11:05 AM [facility]

## 2023-12-16 NOTE — TOC Initial Note (Addendum)
 Transition of Care [facility]) - Initial/Assessment Note    Patient Details  Name: Bob Ortiz MRN: [id number] Date of Birth: Oct 09, 1940  Transition of Care [facility]) CM/SW Contact:    Crayton Docker, RN 12/16/2023, 3:26 PM  Clinical Narrative:                  CM to patient's room regarding TOC screening assessment. Patient's wife, Leary Provencal and brother in law are at bedside. CM introduced case management role and discharge care planning process. Patient's wife verbalized understanding.   CM and patient's wife and patient's daughter, [name] by conference call discussed SNF recommendations. Per patient's wife and patient's daughter, no SNF preferences. CM will complete SNF referral work up   CM completed FL2, alert to Dr. Ariel Begun for signature.  CM screen initiated, PASSR is [id number], reference [id number] Expected Discharge Plan: Skilled Nursing Facility Barriers to Discharge: Continued Medical Work up   Patient Goals and CMS Choice    SNF   Expected Discharge Plan and Services   Discharge Planning Services: CM Consult   Living arrangements for the past 2 months: Single Family Home                   Prior Living Arrangements/Services Living arrangements for the past 2 months: Single Family Home Lives with:: Adult Children, Relatives, Pets (granddaughter, grandson, grandson wife, great grandson, 1 dog--vaccinated) Patient language and need for interpreter reviewed:: No        Need for Family Participation in Patient Care: Yes (Comment) Care giver support system in place?: Yes (comment) Current home services: DME (rollator) Criminal Activity/Legal Involvement Pertinent to Current Situation/Hospitalization: No - Comment as needed  Activities of Daily Living   ADL Screening (condition at time of admission) Independently performs ADLs?: No Does the patient have a NEW difficulty with bathing/dressing/toileting/self-feeding that is expected to last >3 days?: Yes (Initiates electronic  notice to provider for possible OT consult) Does the patient have a NEW difficulty with getting in/out of bed, walking, or climbing stairs that is expected to last >3 days?: Yes (Initiates electronic notice to provider for possible PT consult) Does the patient have a NEW difficulty with communication that is expected to last >3 days?: Yes (Initiates electronic notice to provider for possible SLP consult) Is the patient deaf or have difficulty hearing?: Yes Does the patient have difficulty seeing, even when wearing glasses/contacts?: No Does the patient have difficulty concentrating, remembering, or making decisions?: No  Permission Sought/Granted      Share Information with NAME: Janet/Cindy/Wendy     Permission granted to share info w Relationship: Wife/Daughter/Daughter     Emotional Assessment Appearance:: Appears stated age Attitude/Demeanor/Rapport: Engaged Affect (typically observed): Calm     Psych Involvement: No (comment)  Admission diagnosis:  Seizure (HCC) [R56.9] Elevated troponin [R79.89] Episode of shaking [R25.1] Acute on chronic congestive heart failure, unspecified heart failure type [facility]) [I50.9] Patient Active Problem List   Diagnosis Date Noted   Seizure (HCC) 12/13/2023   Bilateral pleural effusion 12/13/2023   Elevated troponin 12/13/2023   Atrial fibrillation, chronic (HCC) 12/13/2023   Essential hypertension 12/13/2023   Transaminitis 12/13/2023   NICM (nonischemic cardiomyopathy) (HCC) 12/13/2023   PCP:  Alena Hush, MD Pharmacy:   [facility] [phone] [place], [place] - [address] AT [facility] OF [address] [place] [address] Phone: [phone] Fax: [phone]  [id number] - [place], [place] - [address] [place] [address] Phone: [phone] Fax: [phone]     Social Drivers of Health (SDOH) Social History: SDOH Screenings   Food Insecurity: No Food Insecurity (12/13/2023)   Housing: Low Risk  (12/13/2023)  Transportation Needs: No Transportation Needs (12/13/2023)  Utilities: Not At Risk (12/13/2023)  Financial Resource Strain: Low Risk  (12/04/2023)   Received from [facility]  Physical Activity: Sufficiently Active (11/01/2023)   Received from [facility]  Social Connections: Moderately Isolated (12/13/2023)  Tobacco Use: Medium Risk (12/13/2023)   SDOH Interventions:     Readmission Risk Interventions     No data to display

## 2023-12-16 NOTE — Progress Notes (Signed)
   12/16/23 0915  Spiritual Encounters  Type of Visit Follow up  Care provided to: Pt not available  Conversation partners present during encounter Nurse;Other (comment) (Spoke with nurse, Mia, and sitter, Taryen patient has asleep)  Referral source Chaplain assessment  Reason for visit Routine spiritual support  OnCall Visit No  Interventions  Spiritual Care Interventions Made Compassionate presence  Spiritual Care Plan  Spiritual Care Issues Still Outstanding Chaplain will continue to follow   Patient unavailable, family has not arrived. Chaplain will continue to follow up with family.

## 2023-12-16 NOTE — NC FL2 (Addendum)
 [place]  MEDICAID FL2 LEVEL OF CARE FORM     IDENTIFICATION  Patient Name: Bob Ortiz Birthdate: 06/20/1941 Sex: male Admission Date (Current Location): 12/13/2023  [facility] [place] Number:  Chiropodist and Address:  [facility], [address] [place], [place] [address]      Provider Number: [id number]  Attending Physician Name and Address:  Luna Salinas, MD  Relative Name and Phone Number:  Arison Beecroft, wife, phone: [phone]    Current Level of Care: Hospital Recommended Level of Care: Skilled Nursing Facility Prior Approval Number:    Date Approved/Denied:   PASRR Number:  [id number]  Discharge Plan: SNF    Current Diagnoses: Patient Active Problem List   Diagnosis Date Noted   Seizure (HCC) 12/13/2023   Bilateral pleural effusion 12/13/2023   Elevated troponin 12/13/2023   Atrial fibrillation, chronic (HCC) 12/13/2023   Essential hypertension 12/13/2023   Transaminitis 12/13/2023   NICM (nonischemic cardiomyopathy) (HCC) 12/13/2023    Orientation RESPIRATION BLADDER Height & Weight     Self  Normal Incontinent Weight: 77.9 kg Height:  5\' 9"  (175.3 cm)  BEHAVIORAL SYMPTOMS/MOOD NEUROLOGICAL BOWEL NUTRITION STATUS    Convulsions/Seizures Incontinent Diet (Please see discharge summary)  AMBULATORY STATUS COMMUNICATION OF NEEDS Skin   Limited Assist Verbally Normal                       Personal Care Assistance Level of Assistance  Bathing, Feeding, Dressing, Total care Bathing Assistance: Limited assistance Feeding assistance: Limited assistance Dressing Assistance: Limited assistance Total Care Assistance: Limited assistance   Functional Limitations Info             SPECIAL CARE FACTORS FREQUENCY                       Contractures      Additional Factors Info  Code Status, Allergies Code Status Info: Full Allergies Info: Quinapril  High - Swelling  Bumetanide  No severity specified -  Other (See Comments)  Spironolactone  No severity specified - Other (See Comments)  Torsemide  No severity specified - Other (See Comments)           Current Medications (12/16/2023):  This is the current hospital active medication list Current Facility-Administered Medications  Medication Dose Route Frequency Provider Last Rate Last Admin   acetaminophen (TYLENOL) tablet 650 mg  650 mg Oral Q6H PRN Cox, Amy N, DO   650 mg at 12/15/23 1331   Or   acetaminophen (TYLENOL) suppository 650 mg  650 mg Rectal Q6H PRN Cox, Amy N, DO       apixaban (ELIQUIS) tablet 5 mg  5 mg Oral BID Dobbs, Andrea K, RPH   5 mg at 12/16/23 1234   cyanocobalamin (VITAMIN B12) tablet 1,000 mcg  1,000 mcg Oral Daily Cox, Amy N, DO   1,000 mcg at 12/16/23 1234   ethacrynic acid (EDECRIN) tablet 50 mg  50 mg Oral BID Decoste, Gabriella, PA-C   50 mg at 12/16/23 1234   haloperidol lactate (HALDOL) injection 1-2 mg  1-2 mg Intramuscular Q6H PRN Mansy, Jan A, MD   2 mg at 12/16/23 0123   hydrALAZINE (APRESOLINE) injection 5 mg  5 mg Intravenous Q6H PRN Cox, Amy N, DO       LORazepam (ATIVAN) injection 0.5 mg  0.5 mg Intravenous Q6H PRN Cox, Amy N, DO       melatonin tablet 5 mg  5 mg Oral QHS PRN Cox, Amy N, DO   5 mg at 12/15/23 2134   ondansetron (ZOFRAN) tablet 4 mg  4 mg Oral Q6H PRN Cox, Amy N, DO       Or   ondansetron (ZOFRAN) injection 4 mg  4 mg Intravenous Q6H PRN Cox, Amy N, DO       Oral care mouth rinse  15 mL Mouth Rinse PRN Cox, Amy N, DO         Discharge Medications: Please see discharge summary for a list of discharge medications.  Relevant Imaging Results:  Relevant Lab Results:   Additional Information SSN: [id number]  Crayton Docker, RN

## 2023-12-17 ENCOUNTER — Other Ambulatory Visit: Payer: Self-pay

## 2023-12-17 DIAGNOSIS — R569 Unspecified convulsions: Secondary | ICD-10-CM | POA: Diagnosis not present

## 2023-12-17 DIAGNOSIS — Z7189 Other specified counseling: Secondary | ICD-10-CM | POA: Diagnosis not present

## 2023-12-17 DIAGNOSIS — R251 Tremor, unspecified: Secondary | ICD-10-CM | POA: Diagnosis not present

## 2023-12-17 LAB — MAGNESIUM: Magnesium: 1.7 mg/dL (ref 1.7–2.4)

## 2023-12-17 LAB — CBC
HCT: 36.2 % — ABNORMAL LOW (ref 39.0–52.0)
Hemoglobin: 12.4 g/dL — ABNORMAL LOW (ref 13.0–17.0)
MCH: 31.2 pg (ref 26.0–34.0)
MCHC: 34.3 g/dL (ref 30.0–36.0)
MCV: 91 fL (ref 80.0–100.0)
Platelets: 199 10*3/uL (ref 150–400)
RBC: 3.98 MIL/uL — ABNORMAL LOW (ref 4.22–5.81)
RDW: 14.3 % (ref 11.5–15.5)
WBC: 9 10*3/uL (ref 4.0–10.5)
nRBC: 0 % (ref 0.0–0.2)

## 2023-12-17 LAB — BASIC METABOLIC PANEL WITH GFR
Anion gap: 12 (ref 5–15)
BUN: 18 mg/dL (ref 8–23)
CO2: 27 mmol/L (ref 22–32)
Calcium: 8.9 mg/dL (ref 8.9–10.3)
Chloride: 95 mmol/L — ABNORMAL LOW (ref 98–111)
Creatinine, Ser: 0.94 mg/dL (ref 0.61–1.24)
GFR, Estimated: >60 mL/min (ref >60)
Glucose, Bld: 95 mg/dL (ref 70–99)
Potassium: 4.1 mmol/L (ref 3.5–5.1)
Sodium: 134 mmol/L — ABNORMAL LOW (ref 135–145)

## 2023-12-17 MED ORDER — LACTATED RINGERS IV SOLN: INTRAVENOUS | Status: AC

## 2023-12-17 MED ORDER — MAGNESIUM SULFATE 2 GM/50ML IV SOLN
2.0000 g | Freq: Once | INTRAVENOUS | Status: AC
  Administered 2023-12-17: 2 g via INTRAVENOUS
  Filled 2023-12-17: qty 50

## 2023-12-17 MED ORDER — ETHACRYNIC ACID 25 MG PO TABS: 50.0000 mg | ORAL_TABLET | Freq: Every day | ORAL | Status: DC | PRN

## 2023-12-17 NOTE — Progress Notes (Signed)
 Physical Therapy Treatment Patient Details Name: Bob Ortiz MRN: [id number] DOB: [birth year] Today's Date: 12/17/2023   History of Present Illness Bob Ortiz is an 83 year old male with history of CAD with calcification, left bundle branch block, nonischemic cardiomyopathy, hypertension, chronic atrial fibrillation, on Eliquis, who presents ED for chief concerns of seizure-like activity.    PT Comments  Pt received up in chair, family at bedside. Discussed role of PT and pt's prior LOF. Currently pt remains pleasantly confused/disoriented however willing and able to follow multimodal cues to complete tasks. Pt completed gait training with increased time, RW and CG/MinA for safety, directional changes, and assist to stay inside RW (pt ambulates w/o AD at home). Pt's gait presented with a short shuffling like steps, decreased heel strike and knees remained flexed. Will continue to progress towards acute PT goals per POC.   If plan is discharge home, recommend the following: A lot of help with walking and/or transfers;A lot of help with bathing/dressing/bathroom;Assist for transportation;Help with stairs or ramp for entrance   Can travel by private vehicle     Yes  Equipment Recommendations  Other (comment)    Recommendations for Other Services       Precautions / Restrictions Precautions Precautions: ICD/Pacemaker Recall of Precautions/Restrictions: Impaired Precaution/Restrictions Comments: PACEMAKER PRECAUTIONS L UE Restrictions Weight Bearing Restrictions Per Provider Order: No     Mobility  Bed Mobility               General bed mobility comments: N/T in chair pre/post session    Transfers Overall transfer level: Needs assistance Equipment used: Rolling walker (2 wheels) Transfers: Sit to/from Stand Sit to Stand: Min assist           General transfer comment: Cues to limit push off on L UE due to pacemaker placement. Cues for technique     Ambulation/Gait Ambulation/Gait assistance: Contact guard assist, Min assist Gait Distance (Feet): 160 Feet Assistive device: Rolling walker (2 wheels) Gait Pattern/deviations: Step-to pattern, Knee flexed in stance - right, Knee flexed in stance - left, Shuffle, Drifts right/left Gait velocity: significantly decreased     General Gait Details: Poor heel strike, knees ramain slightly flexed throughout gait cycle. MinA to maneuver RW and keep pt inside AD. Short distance gait with HHA   Stairs             Wheelchair Mobility     Tilt Bed    Modified Rankin (Stroke Patients Only)       Balance Overall balance assessment: Needs assistance Sitting-balance support: Bilateral upper extremity supported Sitting balance-Leahy Scale: Fair     Standing balance support: Bilateral upper extremity supported, During functional activity, Reliant on assistive device for balance Standing balance-Leahy Scale: Poor Standing balance comment: High fall risk due to impaired gait and safety awareness                            Communication Communication Communication: Impaired Factors Affecting Communication: Reduced clarity of speech  Cognition Arousal: Lethargic Behavior During Therapy: Flat affect   PT - Cognitive impairments: History of cognitive impairments                       PT - Cognition Comments: ?Lewey Body Dementia Following commands: Impaired Following commands impaired: Follows one step commands inconsistently    Cueing Cueing Techniques: Verbal cues, Tactile cues, Visual cues  Exercises Other Exercises Other Exercises: Pt and family  educated on role of PT current functional goals and plans for STR. Discussed PLOF at home and current needs for increased assistance    General Comments General comments (skin integrity, edema, etc.): Purewick intact. pleasantly confused, very quiet, requires repeated cues for simple tasks      Pertinent  Vitals/Pain Pain Assessment Pain Assessment: No/denies pain    Home Living Family/patient expects to be discharged to:: Private residence Living Arrangements: Spouse/significant other;Children;Other relatives Available Help at Discharge: Family Type of Home: House Home Access: Stairs to enter   [employer] of Steps: 3 level house, patient and spouse live on the top 2 floors (other family live on the lower level) without option for 1st floor set-up.  6-9 steps to access patient's living quarters.            Prior Function            PT Goals (current goals can now be found in the care plan section) Acute Rehab PT Goals Patient Stated Goal: to return home    Frequency    Min 2X/week      PT Plan      Co-evaluation              AM-PAC PT "6 Clicks" Mobility   Outcome Measure  Help needed turning from your back to your side while in a flat bed without using bedrails?: A Lot Help needed moving from lying on your back to sitting on the side of a flat bed without using bedrails?: Total Help needed moving to and from a bed to a chair (including a wheelchair)?: A Lot Help needed standing up from a chair using your arms (e.g., wheelchair or bedside chair)?: A Little Help needed to walk in hospital room?: A Little Help needed climbing 3-5 steps with a railing? : Total 6 Click Score: 12    End of Session Equipment Utilized During Treatment: Gait belt Activity Tolerance: Patient tolerated treatment well;Patient limited by fatigue Patient left: in chair;with call bell/phone within reach;with chair alarm set;with family/visitor present Nurse Communication: Mobility status PT Visit Diagnosis: Unsteadiness on feet (R26.81);Other abnormalities of gait and mobility (R26.89);Repeated falls (R29.6);Muscle weakness (generalized) (M62.81);History of falling (Z91.81);Difficulty in walking, not elsewhere classified (R26.2)     Time: 2952-8413 PT Time Calculation (min)  (ACUTE ONLY): 23 min  Charges:    $Gait Training: 8-22 mins $Therapeutic Activity: 8-22 mins PT General Charges $$ ACUTE PT VISIT: 1 Visit                    Melvyn Stagers, PTA  Diona Franklin 12/17/2023, 12:25 PM

## 2023-12-17 NOTE — TOC Progression Note (Addendum)
 Transition of Care [facility]) - Progression Note    Patient Details  Name: Bob Ortiz MRN: [id number] Date of Birth: July 16, 1941  Transition of Care [facility]) CM/SW Contact  Crayton Docker, RN 12/17/2023, 10:27 AM  Clinical Narrative:     Noted, accepting SNF offers from [employer] and [employer]. CM follow up call to patient's daughter, [name], phone: [phone], no answer, voicemail full. CM call to patient's wife, Leary Provencal, phone: [phone], no answer, CM left message for return call. CM call to patient's daughter, Jenette Mitchell, phone: [phone], no answer, voicemail full.   CM to patient's room regarding SNF preferences. Patient's wife at bedside. Patient's wife requests CM speak to patient's daughter, [name] via phone. CM spoke to patient's daughter. Patient's daughter prefers [facility] [employer].  CM call to [facility] [employer], SNF extended bed offer and will accept patient on Monday, 12/20/2023 pending auth approval.   CM initiated auth for SNF in [place] [id number] is pending.  Expected Discharge Plan: Skilled Nursing Facility Barriers to Discharge: Continued Medical Work up  Expected Discharge Plan and Services   Discharge Planning Services: CM Consult   Living arrangements for the past 2 months: Single Family Home     Social Determinants of Health (SDOH) Interventions SDOH Screenings   Food Insecurity: No Food Insecurity (12/13/2023)  Housing: Low Risk  (12/13/2023)  Transportation Needs: No Transportation Needs (12/13/2023)  Utilities: Not At Risk (12/13/2023)  Financial Resource Strain: Low Risk  (12/04/2023)   [facility]  Physical Activity: Sufficiently Active (11/01/2023)   Received from [facility]  Social Connections: Moderately Isolated (12/13/2023)  Tobacco Use: Medium Risk (12/13/2023)    Readmission Risk Interventions     No data to display

## 2023-12-17 NOTE — Progress Notes (Addendum)
 Daily Progress Note   Patient Name: Bob Ortiz       Date: 12/17/2023 DOB: 1941/07/24  Age: 83 y.o. MRN#: [id number] Attending Physician: Luna Salinas, MD Primary Care Physician: Alena Hush, MD Admit Date: 12/13/2023  Reason for Consultation/Follow-up: Establishing goals of care  Subjective: Notes and labs reviewed.  In to see patient with pharmacist Murphy Arn.  He is currently sitting in bed eating breakfast.  His wife and daughter Bob Ortiz are at bedside.  They are grateful for his care.  They are hopeful that a change to diuretic class will help his cognitive function.  They understand that with his heart failure, he does require a diuretic.  They are hopeful for gaining information with a neurology outpatient dementia evaluation.  Recapped conversation yesterday regarding CODE STATUS and other decisions.  They advised they will continue to discuss these things moving forward and make decisions when prepared to do so.  PMT will follow peripherally as family understands decision pathways and would like time, information, and space to make decisions including decision regarding CODE STATUS.  ADDENDUM: Discussed case with team via epic chat.  Concerns communicated by care team today for changing diuretic class.  Discussion of a different loop diuretic.  Pharmacy advises that patient co-pay for the recommended medication would be $100 a month.  Called to speak with daughter Bob Ortiz to update.  Discussed that attending team would speak with patient and family further regarding treatment and plans.   Length of Stay: 4  Current Medications: Scheduled Meds:   apixaban  5 mg Oral BID   cyanocobalamin  1,000 mcg Oral Daily    Continuous Infusions:  lactated ringers 50 mL/hr at 12/17/23 1229     PRN Meds: acetaminophen **OR** acetaminophen, haloperidol lactate, hydrALAZINE, LORazepam, melatonin, ondansetron **OR** ondansetron (ZOFRAN) IV, mouth rinse  Physical Exam Pulmonary:     Effort: Pulmonary effort is normal.  Skin:    General: Skin is warm.  Neurological:     Mental Status: He is alert.     Comments: Confused             Vital Signs: BP (!) 111/55 (BP Location: Right Arm)   Pulse 73   Temp 99 F (37.2 C)   Resp 16   Ht 5\' 9"  (1.753 m)   Wt 77.9 kg  SpO2 97%   BMI 25.36 kg/m  SpO2: SpO2: 97 % O2 Device: O2 Device: Room Air O2 Flow Rate: O2 Flow Rate (L/min): 2 L/min  Intake/output summary:  Intake/Output Summary (Last 24 hours) at 12/17/2023 1301 Last data filed at 12/17/2023 1140 Gross per 24 hour  Intake 0 ml  Output 250 ml  Net -250 ml   LBM: Last BM Date : 12/15/23 Baseline Weight: Weight: 83.9 kg Most recent weight: Weight: 77.9 kg   Patient Active Problem List   Diagnosis Date Noted   Seizure (HCC) 12/13/2023   Bilateral pleural effusion 12/13/2023   Elevated troponin 12/13/2023   Atrial fibrillation, chronic (HCC) 12/13/2023   Essential hypertension 12/13/2023   Transaminitis 12/13/2023   NICM (nonischemic cardiomyopathy) (HCC) 12/13/2023    Palliative Care Assessment & Plan    Recommendations/Plan: Family would like time for information and outcomes.  They will continue to consider their wishes on care moving forward, including CODE STATUS.  PMT will shadow peripherally.  Please reach out for immediate needs including acute decline.    Code Status:    Code Status Orders  (From admission, onward)           Start     Ordered   12/13/23 1554  Full code  Continuous       Question:  By:  Answer:  Default: patient does not have capacity for decision making, no surrogate or prior directive available   12/13/23 1555           Code Status History     This patient has a current code status but no historical code  status.      Advance Directive Documentation    Flowsheet Row Most Recent Value  Type of Advance Directive Healthcare Power of Attorney, Living will  Pre-existing out of facility DNR order (yellow form or pink MOST form) --  "MOST" Form in Place? --   Thank you for allowing the Palliative Medicine Team to assist in the care of this patient.    Meribeth Standard, NP  Please contact Palliative Medicine Team phone at [phone] for questions and concerns.

## 2023-12-17 NOTE — Progress Notes (Signed)
 Heart Failure Navigator Progress Note  Assessed for Heart & Vascular TOC clinic readiness.  Does not meet criteria due to current [facility] patient.   Navigator will sign off at this time.  Roxy Horseman, RN, BSN [facility] Heart Failure Navigator Secure Chat Only

## 2023-12-17 NOTE — Progress Notes (Signed)
 Occupational Therapy Treatment Patient Details Name: Bob Ortiz MRN: [id number] DOB: Jun 19, 1941 Today's Date: 12/17/2023   History of present illness [name] is an 83 year old male with history of CAD with calcification, left bundle branch block, nonischemic cardiomyopathy, hypertension, chronic atrial fibrillation, on Eliquis, who presents ED for chief concerns of seizure-like activity.   OT comments  Pt seen for OT treatment on this date. Upon arrival to room pt supine in bed (staff finishing pericare), agreeable to tx. Pt requires Mod A x2 to transfer supine to sit EOB with guarding of LUE for pacemaker precautions.  Sit to stand transfer with RUE support only with Min A, Min A/CGA for step pivot transfer to recliner chair with significant cuing for safety awareness, safe management of RW in small spaces, and management of self to access recline.  SBA for face washing/drying task while seated with cuing for task initiation/cessation.  At end of session, patient in recline with BLEs elevated, chair alarm on, call button within reach, family present, 1:1 staff present.  RN notified of patient's current status.  Pt making good progress toward goals, will continue to follow POC. Discharge recommendation remains appropriate.        If plan is discharge home, recommend the following:  A lot of help with bathing/dressing/bathroom;Direct supervision/assist for medications management;Supervision due to cognitive status;Assist for transportation;Assistance with cooking/housework;A little help with walking and/or transfers   Equipment Recommendations       Recommendations for Other Services      Precautions / Restrictions Precautions Precautions: ICD/Pacemaker Recall of Precautions/Restrictions: Impaired Precaution/Restrictions Comments: PACEMAKER PRECAUTIONS       Mobility Bed Mobility Overal bed mobility: Needs Assistance Bed Mobility: Supine to Sit (Mod A x2 with cuing for pacemaker  precautions)     Supine to sit: Mod assist, +2 for physical assistance, +2 for safety/equipment     General bed mobility comments: Difficulty following single step instructions    Transfers Overall transfer level: Needs assistance Equipment used: Rolling walker (2 wheels) Transfers: Sit to/from Stand Sit to Stand: Min assist           General transfer comment: guarded LUE due to pacemaker precautions.     Balance Overall balance assessment: Needs assistance Sitting-balance support: Bilateral upper extremity supported Sitting balance-Leahy Scale: Poor     Standing balance support: Bilateral upper extremity supported Standing balance-Leahy Scale: Poor                             ADL either performed or assessed with clinical judgement   ADL Overall ADL's : Needs assistance/impaired     Grooming: Wash/dry face Grooming Details (indicate cue type and reason): seated in recliner                             Functional mobility during ADLs: Contact guard assist;+2 for physical assistance;+2 for safety/equipment;Cueing for safety;Cueing for sequencing;Rolling walker (2 wheels)      Extremity/Trunk Assessment              Vision   Vision Assessment?: Wears glasses for reading   Perception     Praxis     Communication     Cognition Arousal: Lethargic Behavior During Therapy: Flat affect Cognition: Cognition impaired   Orientation impairments: Person (family present)   Memory impairment (select all impairments): Short-term memory Attention impairment (select first level of impairment): Sustained attention  Following commands: Impaired Following commands impaired: Follows one step commands inconsistently      Cueing   Cueing Techniques: Verbal cues, Tactile cues, Visual cues  Exercises      Shoulder Instructions       General Comments      Pertinent Vitals/ Pain       Pain Assessment Pain  Assessment: No/denies pain  Home Living Family/patient expects to be discharged to:: Private residence Living Arrangements: Spouse/significant other;Children;Other relatives Available Help at Discharge: Family Type of Home: House Home Access: Stairs to enter [employer] of Steps: 3 level house, patient and spouse live on the top 2 floors (other family live on the lower level) without option for 1st floor set-up.  6-9 steps to access patient's living quarters.                              Prior Functioning/Environment              Frequency  Min 2X/week        Progress Toward Goals  OT Goals(current goals can now be found in the care plan section)        Plan      Co-evaluation                 AM-PAC OT "6 Clicks" Daily Activity     Outcome Measure   Help from another person eating meals?: A Little Help from another person taking care of personal grooming?: A Little Help from another person toileting, which includes using toliet, bedpan, or urinal?: A Lot Help from another person bathing (including washing, rinsing, drying)?: A Lot Help from another person to put on and taking off regular upper body clothing?: A Lot Help from another person to put on and taking off regular lower body clothing?: A Lot 6 Click Score: 14    End of Session Equipment Utilized During Treatment: Gait belt;Rolling walker (2 wheels)  OT Visit Diagnosis: Unsteadiness on feet (R26.81);Other abnormalities of gait and mobility (R26.89);Muscle weakness (generalized) (M62.81)   Activity Tolerance Patient tolerated treatment well   Patient Left in chair;with call bell/phone within reach;with chair alarm set;with nursing/sitter in room;with family/visitor present   Nurse Communication Mobility status;Precautions;Other (comment) (Nurse notified of patient location at end of session)        Time: 1030-1108 OT Time Calculation (min): 38 min  Charges: OT General  Charges $OT Visit: 1 Visit OT Treatments $Therapeutic Activity: 38-52 mins  Sherial Dimes Ortiz   Bob Ortiz 12/17/2023, 11:29 AM

## 2023-12-17 NOTE — Plan of Care (Signed)
 Pt alert to self, no c/o pain or s/s of any pain noted. Pt has Soil scientist for safety and pulling IV or purewick. Pt sat up in chair today and POC is SN[facility]. Problem: Education: Goal: Knowledge of General Education information will improve Description: Including pain rating scale, medication(s)/side effects and non-pharmacologic comfort measures Outcome: Progressing   Problem: Health Behavior/Discharge Planning: Goal: Ability to manage health-related needs will improve Outcome: Progressing   Problem: Clinical Measurements: Goal: Ability to maintain clinical measurements within normal limits will improve Outcome: Progressing Goal: Will remain free from infection Outcome: Progressing Goal: Diagnostic test results will improve Outcome: Progressing Goal: Respiratory complications will improve Outcome: Progressing Goal: Cardiovascular complication will be avoided Outcome: Progressing   Problem: Activity: Goal: Risk for activity intolerance will decrease Outcome: Progressing   Problem: Nutrition: Goal: Adequate nutrition will be maintained Outcome: Progressing   Problem: Coping: Goal: Level of anxiety will decrease Outcome: Progressing   Problem: Elimination: Goal: Will not experience complications related to bowel motility Outcome: Progressing Goal: Will not experience complications related to urinary retention Outcome: Progressing   Problem: Pain Managment: Goal: General experience of comfort will improve and/or be controlled Outcome: Progressing   Problem: Safety: Goal: Ability to remain free from injury will improve Outcome: Progressing   Problem: Skin Integrity: Goal: Risk for impaired skin integrity will decrease Outcome: Progressing   Problem: Education: Goal: Expressions of having a comfortable level of knowledge regarding the disease process will increase Outcome: Progressing   Problem: Coping: Goal: Ability to adjust to condition or change in health will  improve Outcome: Progressing Goal: Ability to identify appropriate support needs will improve Outcome: Progressing   Problem: Health Behavior/Discharge Planning: Goal: Compliance with prescribed medication regimen will improve Outcome: Progressing   Problem: Medication: Goal: Risk for medication side effects will decrease Outcome: Progressing   Problem: Clinical Measurements: Goal: Complications related to the disease process, condition or treatment will be avoided or minimized Outcome: Progressing Goal: Diagnostic test results will improve Outcome: Progressing   Problem: Safety: Goal: Verbalization of understanding the information provided will improve Outcome: Progressing   Problem: Self-Concept: Goal: Level of anxiety will decrease Outcome: Progressing Goal: Ability to verbalize feelings about condition will improve Outcome: Progressing

## 2023-12-17 NOTE — Progress Notes (Signed)
   12/17/23 0900  Spiritual Encounters  Type of Visit Follow up  Care provided to: Patient  Conversation partners present during encounter Nurse Psychiatrist in the room)  Referral source Chaplain assessment  Reason for visit Routine spiritual support  OnCall Visit No  Interventions  Spiritual Care Interventions Made Established relationship of care and support  Spiritual Care Plan  Spiritual Care Issues Still Outstanding Chaplain will continue to follow   Chaplain has been following patient and family since they came into the ED on Monday. Patient spoke with chaplain today for the first time.

## 2023-12-17 NOTE — Progress Notes (Signed)
 [facility] CLINIC CARDIOLOGY PROGRESS NOTE       Patient ID: Bob Ortiz MRN: [id number] DOB/AGE: August 15, 1940 83 y.o.  Admit date: 12/13/2023 Referring Physician Dr. Seleta Dakins Primary Physician Alena Hush, MD Primary Cardiologist Dr. Beau Bound Reason for Consultation Elevated Troponins   HPI: Bob Ortiz is a 83 y.o. male  with a past medical history of chronic HFrEF, nonischemic cardiomyopathy LBBB s/p CRT-P ([employer] X4 CRTP -12/08/23), hypertension, coronary artery disease, paroxsymal atrial fibrillation (Eliquis), type 2 diabetes mellitus who presented to the ED on 12/13/2023 for concern for seizure like activity. Patient had biventricular pacemaker placement on 12/08/2023. Cardiology was consulted for further evaluation.   Interval History: -Patient seen and examined this AM and laying comfortably in hospital bed. Patient denies any cardiac sxs.  -Patients BP and HR stable this AM. Patient not on tele.  -Patient remains on room air with stable SpO2.  -Per RN, pt received haloperidol due to agitation/aggression.    Review of systems complete and found to be negative unless listed above    History reviewed. No pertinent past medical history.  Past Surgical History:  Procedure Laterality Date   FRACTURE SURGERY Right    shoulder    Medications Prior to Admission  Medication Sig Dispense Refill Last Dose/Taking   Cholecalciferol (VITAMIN D-1000 MAX ST) 25 MCG (1000 UT) tablet Take 1,000 Units by mouth daily.   12/13/2023   Cholecalciferol 50 MCG (2000 UT) TABS Take 2,000 Units by mouth daily.   12/13/2023   cyanocobalamin (VITAMIN B12) 1000 MCG tablet Take 1,000 mcg by mouth daily.   12/13/2023   ELIQUIS 5 MG TABS tablet Take 5 mg by mouth 2 (two) times daily.   12/13/2023   furosemide (LASIX) 40 MG tablet Take 40 mg by mouth daily.   12/13/2023   magnesium oxide (MAG-OX) 400 (240 Mg) MG tablet Take 2 tablets by mouth daily.   12/13/2023   Melatonin 1 MG CHEW Chew 1 tablet by  mouth daily as needed.   12/12/2023   amiodarone (PACERONE) 200 MG tablet Take 200 mg by mouth 2 (two) times daily. (Patient not taking: Reported on 12/13/2023)   Not Taking   bumetanide (BUMEX) 1 MG tablet Take by mouth. (Patient not taking: Reported on 12/13/2023)   Not Taking   carvedilol (COREG) 3.125 MG tablet Take 3.125 mg by mouth in the morning and at bedtime. (Patient not taking: Reported on 12/13/2023)   Not Taking   empagliflozin (JARDIANCE) 10 MG TABS tablet Take 1 tablet by mouth daily. (Patient not taking: Reported on 12/13/2023)   Not Taking   ENTRESTO 24-26 MG Take 1 tablet by mouth 2 (two) times daily. (Patient not taking: Reported on 12/13/2023)   Not Taking   eplerenone (INSPRA) 25 MG tablet Take 1 tablet by mouth daily. (Patient not taking: Reported on 12/13/2023)   Not Taking   losartan (COZAAR) 100 MG tablet Take 100 mg by mouth daily. (Patient not taking: Reported on 12/13/2023)   Not Taking   metoprolol succinate (TOPROL-XL) 25 MG 24 hr tablet Take 12.5 mg by mouth daily. (Patient not taking: Reported on 12/13/2023)   Not Taking   omeprazole (PRILOSEC) 40 MG capsule Take 40 mg by mouth daily. (Patient not taking: Reported on 12/13/2023)   Not Taking   predniSONE (DELTASONE) 5 MG tablet Take 1 tablet by mouth daily. (Patient not taking: Reported on 12/13/2023)   Not Taking   spironolactone (ALDACTONE) 25 MG tablet Take by mouth. (Patient not taking:  Reported on 12/13/2023)   Not Taking   thiamine (VITAMIN B1) 100 MG tablet Take 100 mg by mouth daily. (Patient not taking: Reported on 12/13/2023)   Not Taking   torsemide (DEMADEX) 20 MG tablet Take 20 mg by mouth daily. (Patient not taking: Reported on 12/13/2023)   Not Taking   traMADol  (ULTRAM ) 50 MG tablet Take 1 tablet (50 mg total) by mouth 2 (two) times daily. (Patient not taking: Reported on 12/13/2023) 10 tablet 0 Not Taking   Social History   Socioeconomic History   Marital status: Married    Spouse name: Not on file   Number of  children: Not on file   Years of education: Not on file   Highest education level: Not on file  Occupational History   Not on file  Tobacco Use   Smoking status: Former    Current packs/day: 0.00    Types: Cigarettes    Quit date: 98    Years since quitting: 50.4   Smokeless tobacco: Never  Vaping Use   Vaping status: Never Used  Substance and Sexual Activity   Alcohol use: Yes    Comment: occassionally   Drug use: Never   Sexual activity: Not on file  Other Topics Concern   Not on file  Social History Narrative   Not on file   Social Drivers of Health   Financial Resource Strain: Low Risk  (12/04/2023)   Received from [facility]   Overall Financial Resource Strain (CARDIA)    Difficulty of Paying Living Expenses: Not hard at all  Food Insecurity: No Food Insecurity (12/13/2023)   Hunger Vital Sign    Worried About Running Out of Food in the Last Year: Never true    Ran Out of Food in the Last Year: Never true  Transportation Needs: No Transportation Needs (12/13/2023)   PRAPARE - Administrator, Civil Service (Medical): No    Lack of Transportation (Non-Medical): No  Physical Activity: Sufficiently Active (11/01/2023)   Received from [facility]   Exercise Vital Sign    Days of Exercise per Week: 4 days    Minutes of Exercise per Session: 40 min  Stress: Not on file  Social Connections: Moderately Isolated (12/13/2023)   Social Connection and Isolation Panel [NHANES]    Frequency of Communication with Friends and Family: More than three times a week    Frequency of Social Gatherings with Friends and Family: More than three times a week    Attends Religious Services: Never    Database administrator or Organizations: No    Attends Banker Meetings: Never    Marital Status: Married  Catering manager Violence: Patient Unable To Answer (12/13/2023)   Humiliation, Afraid, Rape, and Kick questionnaire    Fear of  Current or Ex-Partner: Patient unable to answer    Emotionally Abused: Patient unable to answer    Physically Abused: Patient unable to answer    Sexually Abused: Patient unable to answer    Family History  Problem Relation Age of Onset   Stomach cancer Mother    Heart attack Father 27   Stroke Father      Vitals:   12/16/23 1222 12/16/23 2040 12/17/23 0409 12/17/23 0752  BP: 115/88 122/66 110/61 (!) 111/55  Pulse: 70 86 76 73  Resp: 17 20 16 16   Temp: 97.8 F (36.6 C) 98.2 F (36.8 C) 98.8 F (37.1 C) 99 F (37.2 C)  TempSrc:   Oral   SpO2: 95% (!) 82% 92% 91%  Weight:      Height:        PHYSICAL EXAM General: chronically ill appearing elderly male, well nourished, in no acute distress. HEENT: Normocephalic and atraumatic. Neck: No JVD.   Lungs: Normal respiratory effort on room air. Clear bilaterally to auscultation. No wheezes, crackles, rhonchi.  Heart: HRRR, paced. Normal S1 and S2 without gallops or murmurs.  Abdomen: Non-distended appearing.  Msk: Normal strength and tone for age. Extremities: Warm and well perfused. No clubbing, cyanosis. No edema.  Neuro: Alert and oriented X 3. Psych: Answers questions appropriately.   Labs: Basic Metabolic Panel: Recent Labs    12/16/23 0551 12/17/23 0525  NA 135 134*  K 3.4* 4.1  CL 97* 95*  CO2 28 27  GLUCOSE 85 95  BUN 15 18  CREATININE 0.81 0.94  CALCIUM 8.8* 8.9  MG 1.9 1.7   Liver Function Tests: Recent Labs    12/15/23 0543 12/16/23 0551  AST 57* 43*  ALT 49* 43  ALKPHOS 116 112  BILITOT 1.1 1.0  PROT 6.2* 6.3*  ALBUMIN 3.1* 3.3*   No results for input(s): "LIPASE", "AMYLASE" in the last 72 hours. CBC: Recent Labs    12/15/23 0543 12/17/23 0525  WBC 7.6 9.0  HGB 12.2* 12.4*  HCT 35.8* 36.2*  MCV 91.3 91.0  PLT 184 199   Cardiac Enzymes: No results for input(s): "CKTOTAL", "CKMB", "CKMBINDEX", "TROPONINIHS" in the last 72 hours.  BNP: No results for input(s): "BNP" in the last 72  hours.  D-Dimer: No results for input(s): "DDIMER" in the last 72 hours. Hemoglobin A1C: No results for input(s): "HGBA1C" in the last 72 hours. Fasting Lipid Panel: No results for input(s): "CHOL", "HDL", "LDLCALC", "TRIG", "CHOLHDL", "LDLDIRECT" in the last 72 hours. Thyroid Function Tests: No results for input(s): "TSH", "T4TOTAL", "T3FREE", "THYROIDAB" in the last 72 hours.  Invalid input(s): "FREET3"  Anemia Panel: No results for input(s): "VITAMINB12", "FOLATE", "FERRITIN", "TIBC", "IRON", "RETICCTPCT" in the last 72 hours.   Radiology: [facility] Chest Port 1 View Result Date: 12/16/2023 CLINICAL DATA:  [id number] Congestive heart failure (HCC) [id number] EXAM: PORTABLE CHEST 1 VIEW COMPARISON:  Dec 13, 2023 FINDINGS: The cardiomediastinal silhouette is unchanged and enlarged in contour.LEFT chest cardiac pacing device. Atherosclerotic calcifications. Small LEFT pleural effusion, similar in comparison to prior. No pneumothorax. Favored slightly improved hilar interstitial prominence. IMPRESSION: 1. Favored slightly improved pulmonary edema. 2. Small LEFT pleural effusion, similar in comparison to prior. Electronically Signed   By: Clancy Crimes M.D.   On: 12/16/2023 07:49   ECHOCARDIOGRAM COMPLETE Result Date: 12/15/2023    ECHOCARDIOGRAM REPORT   Patient Name:   Bob Ortiz Date of Exam: 12/14/2023 Medical Rec #:  [id number]    Height:       69.0 in Accession #:    [id number]   Weight:       185.0 lb Date of Birth:  [birth year]     BSA:          1.999 m Patient Age:    83 years     BP:           122/85 mmHg Patient Gender: M            HR:           70 bpm. Exam Location:  [facility] Procedure: 2D Echo, Cardiac Doppler and Color Doppler (Both Spectral and Color  Flow Doppler were utilized during procedure). Indications:     I46.9 Cardiac Arrest  History:         Patient has no prior history of Echocardiogram examinations.  Sonographer:     Brigid Canada RDCS Referring Phys:  [id number]  CARALYN HUDSON Diagnosing Phys: Sabina Custovic IMPRESSIONS  1. Left ventricular ejection fraction, by estimation, is <20%. The left ventricle has severely decreased function. The left ventricle demonstrates global hypokinesis. The left ventricular internal cavity size was moderately dilated. Left ventricular diastolic parameters are consistent with Grade III diastolic dysfunction (restrictive).  2. Right ventricular systolic function is mildly reduced. The right ventricular size is mildly enlarged. There is mildly elevated pulmonary artery systolic pressure. The estimated right ventricular systolic pressure is 36.5 mmHg.  3. Left atrial size was severely dilated.  4. Right atrial size was moderately dilated.  5. Large pleural effusion.  6. The mitral valve is abnormal. Severe mitral valve regurgitation. No evidence of mitral stenosis.  7. The tricuspid valve is abnormal. Tricuspid valve regurgitation is moderate.  8. The aortic valve is normal in structure. Aortic valve regurgitation is not visualized. No aortic stenosis is present.  9. There is moderate dilatation of the ascending aorta. 10. The inferior vena cava is normal in size with greater than 50% respiratory variability, suggesting right atrial pressure of 3 mmHg. FINDINGS  Left Ventricle: Left ventricular ejection fraction, by estimation, is <20%. The left ventricle has severely decreased function. The left ventricle demonstrates global hypokinesis. The left ventricular internal cavity size was moderately dilated. There is no left ventricular hypertrophy. Left ventricular diastolic parameters are consistent with Grade III diastolic dysfunction (restrictive). Right Ventricle: The right ventricular size is mildly enlarged. No increase in right ventricular wall thickness. Right ventricular systolic function is mildly reduced. There is mildly elevated pulmonary artery systolic pressure. The tricuspid regurgitant  velocity is 2.67 m/s, and with an assumed right  atrial pressure of 8 mmHg, the estimated right ventricular systolic pressure is 36.5 mmHg. Left Atrium: Left atrial size was severely dilated. Right Atrium: Right atrial size was moderately dilated. Pericardium: There is no evidence of pericardial effusion. Mitral Valve: The mitral valve is abnormal. Severe mitral valve regurgitation. No evidence of mitral valve stenosis. Tricuspid Valve: The tricuspid valve is abnormal. Tricuspid valve regurgitation is moderate . No evidence of tricuspid stenosis. Aortic Valve: The aortic valve is normal in structure. Aortic valve regurgitation is not visualized. No aortic stenosis is present. Pulmonic Valve: The pulmonic valve was normal in structure. Pulmonic valve regurgitation is not visualized. No evidence of pulmonic stenosis. Aorta: There is moderate dilatation of the ascending aorta. Venous: The inferior vena cava is normal in size with greater than 50% respiratory variability, suggesting right atrial pressure of 3 mmHg. IAS/Shunts: No atrial level shunt detected by color flow Doppler. Additional Comments: There is a large pleural effusion.  LEFT VENTRICLE PLAX 2D LVIDd:         6.40 cm   Diastology LVIDs:         5.50 cm   LV e' medial:    6.24 cm/s LV PW:         1.00 cm   LV E/e' medial:  11.4 LV IVS:        0.90 cm   LV e' lateral:   6.25 cm/s LVOT diam:     2.30 cm   LV E/e' lateral: 11.4 LV SV:         47 LV SV Index:   23  LVOT Area:     4.15 cm  RIGHT VENTRICLE            IVC RV Basal diam:  5.40 cm    IVC diam: 1.90 cm RV S prime:     8.98 cm/s TAPSE (M-mode): 1.4 cm LEFT ATRIUM              Index        RIGHT ATRIUM           Index LA diam:        5.10 cm  2.55 cm/m   RA Area:     24.00 cm LA Vol (A2C):   135.0 ml 67.55 ml/m  RA Volume:   77.80 ml  38.93 ml/m LA Vol (A4C):   106.0 ml 53.04 ml/m LA Biplane Vol: 123.0 ml 61.54 ml/m  AORTIC VALVE LVOT Vmax:   64.20 cm/s LVOT Vmean:  40.900 cm/s LVOT VTI:    0.113 m  AORTA Ao Root diam: 4.00 cm Ao Asc diam:   4.50 cm MITRAL VALVE               TRICUSPID VALVE MV Area (PHT): 4.49 cm    TR Peak grad:   28.5 mmHg MV Decel Time: 169 msec    TR Vmax:        267.00 cm/s MR Peak grad: 85.7 mmHg MR Mean grad: 57.0 mmHg    SHUNTS MR Vmax:      463.00 cm/s  Systemic VTI:  0.11 m MR Vmean:     356.0 cm/s   Systemic Diam: 2.30 cm MV E velocity: 71.30 cm/s MV A velocity: 26.70 cm/s MV E/A ratio:  2.67 Sabina Custovic Electronically signed by Isabell Manzanilla Signature Date/Time: 12/15/2023/1:54:24 PM    Final    EEG adult Result Date: 12/14/2023 Arleene Lack, MD     12/14/2023  4:55 PM Patient Name: Bob Ortiz MRN: [id number] Epilepsy Attending: Arleene Lack Referring Physician/Provider: Kimberley Penman, MD Date: 12/14/2023 Duration: 26.25 mins Patient history: 83yo M with seizure like activity. EEG to evaluate for seizure Level of alertness: Awake AEDs during EEG study: None Technical aspects: This EEG study was done with scalp electrodes positioned according to the 10-20 International system of electrode placement. Electrical activity was reviewed with band pass filter of 1-70Hz , sensitivity of 7 uV/mm, display speed of 15mm/sec with a 60Hz  notched filter applied as appropriate. EEG data were recorded continuously and digitally stored.  Video monitoring was available and reviewed as appropriate. Description: The posterior dominant rhythm consists of 8 Hz activity of moderate voltage (25-35 uV) seen predominantly in posterior head regions, symmetric and reactive to eye opening and eye closing. Hyperventilation and photic stimulation were not performed.   IMPRESSION: This study is within normal limits. No seizures or epileptiform discharges were seen throughout the recording. A normal interictal EEG does not exclude the diagnosis of epilepsy. Priyanka Suzanne Erps   US  ABDOMEN LIMITED RUQ (LIVER/GB) Result Date: 12/13/2023 CLINICAL DATA:  Transaminitis EXAM: ULTRASOUND ABDOMEN LIMITED RIGHT UPPER QUADRANT COMPARISON:  Chest  x-ray and chest CTA earlier 12/13/2023 FINDINGS: Gallbladder: Previous cholecystectomy. Common bile duct: Diameter: 2 mm Liver: No focal lesion identified. Within normal limits in parenchymal echogenicity. Portal vein is patent on color Doppler imaging with normal direction of blood flow towards the liver. Other: Right pleural effusion. Please correlate with prior x-ray and CTA. IMPRESSION: Previous cholecystectomy.  No ductal dilatation. Electronically Signed   By: Adrianna Horde M.D.   On:  12/13/2023 16:39   CT Head Wo Contrast Result Date: 12/13/2023 CLINICAL DATA:  Provided history: Mental status change, unknown cause. EXAM: CT HEAD WITHOUT CONTRAST TECHNIQUE: Contiguous axial images were obtained from the base of the skull through the vertex without intravenous contrast. RADIATION DOSE REDUCTION: This exam was performed according to the departmental dose-optimization program which includes automated exposure control, adjustment of the mA and/or kV according to patient size and/or use of iterative reconstruction technique. COMPARISON:  Head CT 09/01/2023. FINDINGS: Brain: Generalized cerebral atrophy. Patchy and ill-defined hypoattenuation within the cerebral white matter, nonspecific but compatible with mild chronic small vessel ischemic disease. There is no acute intracranial hemorrhage. No demarcated cortical infarct. No extra-axial fluid collection. No evidence of an intracranial mass. No midline shift. Vascular: No hyperdense vessel.  Atherosclerotic calcifications. Skull: No calvarial fracture or aggressive osseous lesion. Visible sinuses/orbits: No mass or acute finding within the imaged orbits. Minimal mucosal thickening within the right ethmoid and right sphenoid sinuses. Other: Trace left mastoid effusion. IMPRESSION: 1. No evidence of an acute intracranial abnormality. 2. Parenchymal atrophy and chronic small vessel ischemic disease. 3. Minor paranasal sinus mucosal thickening at the imaged levels.  4. Trace left mastoid effusion. Electronically Signed   By: Bascom Lily D.O.   On: 12/13/2023 15:36   DG Chest 1 View Result Date: 12/13/2023 CLINICAL DATA:  Unresponsiveness EXAM: CHEST  1 VIEW COMPARISON:  March 22, 2017 FINDINGS: No change in the left subclavian bipolar ICDF pacemaker device tip of the leads in good position no evidence of discontinuity Bilateral interstitial prominence and small left effusion could correlate with congestive changes. Heart upper limits of normal. No pneumothorax. IMPRESSION: Congestive changes. Electronically Signed   By: Fredrich Jefferson M.D.   On: 12/13/2023 15:14   CT Angio Chest PE W/Cm &/Or Wo Cm Result Date: 12/13/2023 CLINICAL DATA:  Pulmonary embolism EXAM: CT ANGIOGRAPHY CHEST WITH CONTRAST TECHNIQUE: Multidetector CT imaging of the chest was performed using the standard protocol during bolus administration of intravenous contrast. Multiplanar CT image reconstructions and MIPs were obtained to evaluate the vascular anatomy. RADIATION DOSE REDUCTION: This exam was performed according to the departmental dose-optimization program which includes automated exposure control, adjustment of the mA and/or kV according to patient size and/or use of iterative reconstruction technique. CONTRAST:  75mL OMNIPAQUE IOHEXOL 350 MG/ML SOLN COMPARISON:  None Available. FINDINGS: Cardiovascular: Satisfactory opacification of the pulmonary arteries to the segmental level. No evidence of pulmonary embolism. Normal heart size. No pericardial effusion. Coronary artery calcifications, small to medium-sized pericardial effusion Mediastinum/Nodes: No enlarged mediastinal, hilar, or axillary lymph nodes. Thyroid gland, trachea, and esophagus demonstrate no significant findings. Lungs/Pleura: Bilateral pleural effusions, left larger than right with basilar atelectasis. Interstitial prominence with ground-glass appearance could correlate with congestive changes versus superimposed pneumonitis.  No evidence of pulmonary embolism. No aortic dissection on this limited enhanced pulmonary CT angiogram images Upper Abdomen: No acute abnormality. Musculoskeletal: No chest wall abnormality. No acute or significant osseous findings. Review of the MIP images confirms the above findings. IMPRESSION: *No evidence of pulmonary embolism. *Bilateral pleural effusions, left larger than right with basilar atelectasis. *Interstitial prominence with ground-glass appearance could correlate with congestive changes versus superimposed pneumonitis. *Coronary artery calcifications, small to medium-sized pericardial effusion. Electronically Signed   By: Fredrich Jefferson M.D.   On: 12/13/2023 15:09    ECHO as above  TELEMETRY reviewed by me 12/17/2023: Not on tele  EKG reviewed by me: V-paced, rate 70 bpm  Data reviewed by me 12/17/2023: last  24h vitals tele labs imaging I/O hospitalist note  Principal Problem:   Seizure (HCC) Active Problems:   Bilateral pleural effusion   Elevated troponin   Atrial fibrillation, chronic (HCC)   Essential hypertension   Transaminitis   NICM (nonischemic cardiomyopathy) (HCC)    ASSESSMENT AND PLAN:   Bob Ortiz is a 83 y.o. male  with a past medical history of chronic HFrEF, nonischemic cardiomyopathy LBBB s/p CRT-P ([employer] X4 CRTP -12/08/23), hypertension, coronary artery disease, paroxsymal atrial fibrillation (Eliquis), type 2 diabetes mellitus who presented to the ED on 12/13/2023 for concern for seizure like activity. Patient had biventricular pacemaker placement on 12/08/2023. Cardiology was consulted for further evaluation.   # Seizure like activity # Nonischemic cardiomyopathy, LBBB s/p CRT-P (12/08/23) # Paroxsymal atrial fibrillation  # Hypertension # Demand Ischemia # Acute on chronic HFrEF Patient with recent CRT-P placement on 05/07 reports to ED via EMS due to having seizure-like activity, chest compressions initiated by daughter at home. EMS  arrived and patient had pulse and responsive. BNP elevated 780. Trops elevated and trended 322 > 405. CXR and CT reveals bilateral pleural effusions. EKG in ED with V-paced, rate 70 bpm. Investigated patients CRT-P at bedside with [employer] device with evidence of arrhthymias or significant events found. EEG study within normal limits. Echo this admission with EF < 20%, global hypokinesis, severe MR, moderate TR, large pleural effusion. Echo this admission similar to previous echo on 12/06/23. Patient BP and HR stable -Continue Eliquis 5 mg BID for stroke risk reduction. -Due to allergies/hx of hallucinations to all loop diuretics (lasix, torsemide, bumex) and spirolactone. Diuretic options are limited. Closely monitor renal function and UOP.          -Pt's daughter reports pt hallucinates with all loop diuretics  -Monitor and replenish electrolytes for a goal K >4, Mag >2. -Elevated troponins most consistent with demand/supply mismatch and not ACS.   This patient's plan of care was discussed and created with Dr. Custovic and she is in agreement.  Cardiology will sign off. Please haiku with questions or re-engage if needed.    Signed: Creighton Doffing, PA-C  12/17/2023, 10:49 AM [facility] Cardiology

## 2023-12-17 NOTE — Progress Notes (Signed)
 Per provider one to one sitter discontinued and Tele sitter to be placed. Provider Dr. Ariel Begun will place order for Tele sitter.

## 2023-12-17 NOTE — Progress Notes (Signed)
 1      PROGRESS NOTE    Bob Ortiz  ZOX:[id number] DOB: June 27, 1941 DOA: 12/13/2023 PCP: Alena Hush, MD   Brief Narrative:   83 year old male with history of CAD with calcification, left bundle branch block, nonischemic cardiomyopathy, hypertension, chronic atrial fibrillation, on Eliquis, who presents ED for chief concerns of seizure-like activity.   5/13: Neurology, palliative care and cardiology consult  5/14: Per neurology concern of Lewy body dementia and patient need outpatient evaluation for final diagnosis.  No need for any antiseizure medications as he might be having spontaneous movements due to transient brain hypoxia with very low EF or a syncopal convulsion. PT is recommending SNF  5/15: Some overnight agitation requiring medication.  Cardiology ordered edecrin 50 mg for 2 doses and will evaluate volume status tomorrow.  Potassium at 3.4  5/16: Remained hemodynamically stable and no clinical change with edecrin, cardiology is recommending 50 mg daily as needed.  Appears little dry today so giving some gentle IV fluid.  Assessment & Plan:   Principal Problem:   Seizure (HCC) Active Problems:   Elevated troponin   Bilateral pleural effusion   Atrial fibrillation, chronic (HCC)   Essential hypertension   Transaminitis   NICM (nonischemic cardiomyopathy) (HCC)  * Seizure (HCC) Acute metabolic encephalopathy Seizure, fall, aspiration precaution Neurology following Intermittent hallucination reported in past hospital visits at [facility] and [facility].  He is also having visual hallucination here reported by nursing -CT head showing no acute intracranial abnormality -EEG showed no epileptic waves - Continue lorazepam 0.5 mg IV every 6 hours as needed for seizure - Frequent neurochecks -No indication for AED at this time per neuro -unable to rule out neuro degenerative disease -No arrhythmia reported on CRT-P -Outpatient neurology follow-up for evaluation of  concerning Lewy body dementia   Elevated troponin Due to supply/demand ischemia.  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  For SBP greater than 170   Bilateral pleural effusion Left > right. Cardio seen Received IV Lasix, now being transitioned to Edecrin Strict I's and O's  GOC Overall poor prognosis. May have underlying neuro degenerative dz/dementia Palliative care c/s-family would like to keep him full code for now   DVT prophylaxis: Eliquis Place TED hose Start: 12/13/23 1553 apixaban (ELIQUIS) tablet 5 mg     Code Status: Full Code Family Communication: Discussed with daughter and wife at bedside Disposition Plan: Medically stable for SNF   Consultants:  Neuro Palliative care Cardiology  Subjective: Patient was working with PT when seen today.  No new concern.  Objective: Vitals:   12/17/23 0409 12/17/23 0752 12/17/23 1114 12/17/23 1126  BP: 110/61 (!) 111/55    Pulse: 76 73    Resp: 16 16    Temp: 98.8 F (37.1 C) 99 F (37.2 C)    TempSrc: Oral     SpO2: 92% 91% 97% 97%  Weight:      Height:        Intake/Output Summary (Last 24  hours) at 12/17/2023 1511 Last data filed at 12/17/2023 1140 Gross per 24 hour  Intake 0 ml  Output 250 ml  Net -250 ml   Filed Weights   12/13/23 1901 12/15/23 0715  Weight: 83.9 kg 77.9 kg    Examination:  General.  Frail elderly gentleman, in no  acute distress. Pulmonary.  Lungs clear bilaterally, normal respiratory effort. CV.  Regular rate and rhythm, no JVD, rub or murmur. Abdomen.  Soft, nontender, nondistended, BS positive. CNS.  Alert and oriented to self and family.  No focal neurologic deficit. Extremities.  No edema, no cyanosis, pulses intact and symmetrical.   Data Reviewed: I have personally reviewed following labs and imaging studies  CBC: Recent Labs  Lab 12/13/23 1344 12/14/23 0451 12/15/23 0543 12/17/23 0525  WBC 9.2 6.9 7.6 9.0  NEUTROABS 6.8  --   --   --   HGB 13.6 12.1* 12.2* 12.4*  HCT 42.6 36.4* 35.8* 36.2*  MCV 100.0 94.8 91.3 91.0  PLT 207 189 184 199   Basic Metabolic Panel: Recent Labs  Lab 12/13/23 1344 12/14/23 0451 12/15/23 0543 12/16/23 0551 12/17/23 0525  NA 130* 130* 134* 135 134*  K 3.6 3.5 3.4* 3.4* 4.1  CL 96* 101 97* 97* 95*  CO2 23 22 29 28 27   GLUCOSE 100* 64* 85 85 95  BUN 13 10 13 15 18   CREATININE 0.92 0.74 0.90 0.81 0.94  CALCIUM 8.4* 7.4* 8.6* 8.8* 8.9  MG 2.1 2.0 2.0 1.9 1.7  PHOS 3.9  --   --   --   --    GFR: Estimated Creatinine Clearance: 60.6 mL/min (by C-G formula based on SCr of 0.94 mg/dL). Liver Function Tests: Recent Labs  Lab 12/13/23 1344 12/14/23 0451 12/15/23 0543 12/16/23 0551  AST 105* 69* 57* 43*  ALT 61* 47* 49* 43  ALKPHOS 131* 105 116 112  BILITOT 0.9 1.1 1.1 1.0  PROT 6.4* 5.4* 6.2* 6.3*  ALBUMIN 3.1* 2.7* 3.1* 3.3*   No results for input(s): "LIPASE", "AMYLASE" in the last 168 hours. No results for input(s): "AMMONIA" in the last 168 hours. Coagulation Profile: Recent Labs  Lab 12/13/23 1344  INR 1.4*    Thyroid Function Tests: No results for input(s): "TSH", "T4TOTAL", "FREET4", "T3FREE", "THYROIDAB" in the last 72 hours.  Anemia Panel: No results for input(s): "VITAMINB12", "FOLATE", "FERRITIN", "TIBC", "IRON", "RETICCTPCT" in the last 72 hours. Sepsis Labs: Recent Labs  Lab 12/13/23 1617 12/13/23 1846   LATICACIDVEN 1.7 1.0    Recent Results (from the past 240 hours)  Culture, blood (routine x 2)     Status: None (Preliminary result)   Collection Time: 12/13/23  4:17 PM   Specimen: BLOOD  Result Value Ref Range Status   Specimen Description BLOOD BLOOD RIGHT FOREARM  Final   Special Requests   Final    BOTTLES DRAWN AEROBIC AND ANAEROBIC Blood Culture adequate volume   Culture   Final    NO GROWTH 4 DAYS Performed at [facility], [address] [place], [place] [address]    Report Status PENDING  Incomplete  Culture, blood (routine x 2)     Status: None (Preliminary result)   Collection Time: 12/13/23  4:18 PM   Specimen: BLOOD  Result Value Ref Range Status   Specimen Description BLOOD BLOOD RIGHT FOREARM  Final   Special Requests   Final    BOTTLES DRAWN AEROBIC AND ANAEROBIC Blood Culture adequate volume   Culture   Final  NO GROWTH 4 DAYS Performed at [facility], [address] [place] [place], [place] [address]    Report Status PENDING  Incomplete     Radiology Studies: DG Chest Port 1 View Result Date: 12/16/2023 CLINICAL DATA:  [id number] Congestive heart failure (HCC) 440102 EXAM: PORTABLE CHEST 1 VIEW COMPARISON:  Dec 13, 2023 FINDINGS: The cardiomediastinal silhouette is unchanged and enlarged in contour.LEFT chest cardiac pacing device. Atherosclerotic calcifications. Small LEFT pleural effusion, similar in comparison to prior. No pneumothorax. Favored slightly improved hilar interstitial prominence. IMPRESSION: 1. Favored slightly improved pulmonary edema. 2. Small LEFT pleural effusion, similar in comparison to prior. Electronically Signed   By: Clancy Crimes M.D.   On: 12/16/2023 07:49   Scheduled Meds:  apixaban  5 mg Oral BID   cyanocobalamin  1,000 mcg Oral Daily   Continuous Infusions:  lactated ringers 50 mL/hr at 12/17/23 1229     LOS: 4 days   Time spent: 45 mins  Luna Salinas, MD [facility] Pager [phone]  If  7PM-7AM, please contact night-coverage [email or web address]  12/17/2023, 3:11 PM

## 2023-12-18 DIAGNOSIS — R251 Tremor, unspecified: Secondary | ICD-10-CM | POA: Diagnosis not present

## 2023-12-18 DIAGNOSIS — R569 Unspecified convulsions: Secondary | ICD-10-CM | POA: Diagnosis not present

## 2023-12-18 LAB — CULTURE, BLOOD (ROUTINE X 2)
Culture: NO GROWTH
Culture: NO GROWTH
Special Requests: ADEQUATE
Special Requests: ADEQUATE

## 2023-12-18 NOTE — Progress Notes (Signed)
 1      PROGRESS NOTE    Bob Ortiz  ZOX:[id number] DOB: 15-Apr-1941 DOA: 12/13/2023 PCP: Alena Hush, MD   Brief Narrative:   83 year old male with history of CAD with calcification, left bundle branch block, nonischemic cardiomyopathy, hypertension, chronic atrial fibrillation, on Eliquis , who presents ED for chief concerns of seizure-like activity.   5/13: Neurology, palliative care and cardiology consult  5/14: Per neurology concern of Lewy body dementia and patient need outpatient evaluation for final diagnosis.  No need for any antiseizure medications as he might be having spontaneous movements due to transient brain hypoxia with very low EF or a syncopal convulsion. PT is recommending SNF  5/15: Some overnight agitation requiring medication.  Cardiology ordered edecrin  50 mg for 2 doses and will evaluate volume status tomorrow.  Potassium at 3.4  5/16: Remained hemodynamically stable and no clinical change with edecrin , cardiology is recommending 50 mg daily as needed.  Appears little dry today so giving some gentle IV fluid.  5/17: Remains stable and clinically improving.  Pending insurance authorization for SNF  Assessment & Plan:   Principal Problem:   Seizure (HCC) Active Problems:   Elevated troponin   Bilateral pleural effusion   Atrial fibrillation, chronic (HCC)   Essential hypertension   Transaminitis   NICM (nonischemic cardiomyopathy) (HCC)  * Seizure (HCC) Acute metabolic encephalopathy Seizure, fall, aspiration precaution Neurology following Intermittent hallucination reported in past hospital visits at [facility] and [facility].  He is also having visual hallucination here reported by nursing -CT head showing no acute intracranial abnormality -EEG showed no epileptic waves - Continue lorazepam  0.5 mg IV every 6 hours as needed for seizure - Frequent neurochecks -No indication for AED at this time per neuro -unable to rule out neuro degenerative  disease -No arrhythmia reported on CRT-P -Outpatient neurology follow-up for evaluation of concerning Lewy body dementia   Elevated troponin Due to supply/demand ischemia.  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  For SBP greater than 170   Bilateral pleural effusion Left > right. Cardio seen Received IV Lasix , now being transitioned to Edecrin  Strict I's and O's  GOC Overall poor prognosis. May have underlying neuro degenerative dz/dementia Palliative care c/s-family would like to keep him full code for now   DVT prophylaxis: Eliquis  Place TED hose Start: 12/13/23 1553 apixaban  (ELIQUIS ) tablet 5 mg     Code Status: Full Code Family Communication: Discussed with daughter and wife at bedside Disposition Plan: Medically stable for SNF   Consultants:  Neuro Palliative care Cardiology  Subjective: Patient was seen and examined today.  No new concern.  Per daughter at bedside he appears much improved today.  Objective: Vitals:   12/17/23 1126 12/17/23 1547 12/17/23 2028 12/18/23 0730  BP:  113/65 (!) 108/59 121/64  Pulse:  80 84 70  Resp:  16 16 16   Temp:  98.7 F (37.1 C) 98.4 F (36.9 C) 97.6 F (36.4 C)  TempSrc:    Oral  SpO2: 97% 98% 99% 98%  Weight:      Height:        Intake/Output Summary (Last 24 hours) at 12/18/2023 1419 Last data filed at 12/18/2023 0400 Gross per 24 hour  Intake --   Output 600 ml  Net -600 ml   Filed Weights   12/13/23 1901 12/15/23 0715  Weight: 83.9 kg 77.9 kg    Examination:  General.  Frail elderly man, in no acute distress. Pulmonary.  Lungs clear bilaterally, normal respiratory effort. CV.  Regular rate and rhythm, no JVD, rub or murmur. Abdomen.  Soft, nontender, nondistended, BS positive. CNS.  Alert and oriented .  No focal neurologic deficit. Extremities.  No edema, no cyanosis, pulses intact and symmetrical.   Data Reviewed: I have personally reviewed following labs and imaging studies  CBC: Recent Labs  Lab 12/13/23 1344 12/14/23 0451 12/15/23 0543 12/17/23 0525  WBC 9.2 6.9 7.6 9.0  NEUTROABS 6.8  --   --   --   HGB 13.6 12.1* 12.2* 12.4*  HCT 42.6 36.4* 35.8* 36.2*  MCV 100.0 94.8 91.3 91.0  PLT 207 189 184 199   Basic Metabolic Panel: Recent Labs  Lab 12/13/23 1344 12/14/23 0451 12/15/23 0543 12/16/23 0551 12/17/23 0525  NA 130* 130* 134* 135 134*  K 3.6 3.5 3.4* 3.4* 4.1  CL 96* 101 97* 97* 95*  CO2 23 22 29 28 27   GLUCOSE 100* 64* 85 85 95  BUN 13 10 13 15 18   CREATININE 0.92 0.74 0.90 0.81 0.94  CALCIUM 8.4* 7.4* 8.6* 8.8* 8.9  MG 2.1 2.0 2.0 1.9 1.7  PHOS 3.9  --   --   --   --    GFR: Estimated Creatinine Clearance: 60.6 mL/min (by C-G formula based on SCr of 0.94 mg/dL). Liver Function Tests: Recent Labs  Lab 12/13/23 1344 12/14/23 0451 12/15/23 0543 12/16/23 0551  AST 105* 69* 57* 43*  ALT 61* 47* 49* 43  ALKPHOS 131* 105 116 112  BILITOT 0.9 1.1 1.1 1.0  PROT 6.4* 5.4* 6.2* 6.3*  ALBUMIN 3.1* 2.7* 3.1* 3.3*   No results for input(s): "LIPASE", "AMYLASE" in the last 168 hours. No results for input(s): "AMMONIA" in the last 168 hours. Coagulation Profile: Recent Labs  Lab 12/13/23 1344  INR 1.4*    Thyroid Function Tests: No results for input(s): "TSH", "T4TOTAL", "FREET4", "T3FREE", "THYROIDAB" in the last 72 hours.  Anemia Panel: No results for input(s): "VITAMINB12",  "FOLATE", "FERRITIN", "TIBC", "IRON", "RETICCTPCT" in the last 72 hours. Sepsis Labs: Recent Labs  Lab 12/13/23 1617 12/13/23 1846  LATICACIDVEN 1.7 1.0    Recent Results (from the past 240 hours)  Culture, blood (routine x 2)     Status: None   Collection Time: 12/13/23  4:17 PM   Specimen: BLOOD  Result Value Ref Range Status   Specimen Description BLOOD BLOOD RIGHT FOREARM  Final   Special Requests   Final    BOTTLES DRAWN AEROBIC AND ANAEROBIC Blood Culture adequate volume   Culture   Final    NO GROWTH 5 DAYS Performed at [facility], [address] [place], [place] [address]    Report Status 12/18/2023 FINAL  Final  Culture, blood (routine x 2)     Status: None   Collection Time: 12/13/23  4:18 PM   Specimen: BLOOD  Result Value Ref Range Status   Specimen Description BLOOD BLOOD RIGHT FOREARM  Final   Special Requests   Final  BOTTLES DRAWN AEROBIC AND ANAEROBIC Blood Culture adequate volume   Culture   Final    NO GROWTH 5 DAYS Performed at [facility], [address] [place] [place], [place] [address]    Report Status 12/18/2023 FINAL  Final     Radiology Studies: No results found.  Scheduled Meds:  apixaban   5 mg Oral BID   cyanocobalamin   1,000 mcg Oral Daily   Continuous Infusions:     LOS: 5 days   Time spent: 40 mins  Luna Salinas, MD [facility] Pager [phone]  If 7PM-7AM, please contact night-coverage [email or web address]  12/18/2023, 2:19 PM

## 2023-12-18 NOTE — Plan of Care (Signed)
  Problem: Education: Goal: Knowledge of General Education information will improve Description: Including pain rating scale, medication(s)/side effects and non-pharmacologic comfort measures Outcome: Progressing   Problem: Health Behavior/Discharge Planning: Goal: Ability to manage health-related needs will improve Outcome: Progressing   Problem: Clinical Measurements: Goal: Ability to maintain clinical measurements within normal limits will improve Outcome: Progressing Goal: Will remain free from infection Outcome: Progressing Goal: Diagnostic test results will improve Outcome: Progressing Goal: Respiratory complications will improve Outcome: Progressing Goal: Cardiovascular complication will be avoided Outcome: Progressing   Problem: Coping: Goal: Level of anxiety will decrease Outcome: Progressing   Problem: Elimination: Goal: Will not experience complications related to bowel motility Outcome: Progressing Goal: Will not experience complications related to urinary retention Outcome: Progressing   Problem: Safety: Goal: Ability to remain free from injury will improve Outcome: Progressing   Problem: Skin Integrity: Goal: Risk for impaired skin integrity will decrease Outcome: Progressing   Problem: Coping: Goal: Ability to adjust to condition or change in health will improve Outcome: Progressing Goal: Ability to identify appropriate support needs will improve Outcome: Progressing   Problem: Health Behavior/Discharge Planning: Goal: Compliance with prescribed medication regimen will improve Outcome: Progressing

## 2023-12-19 DIAGNOSIS — R251 Tremor, unspecified: Secondary | ICD-10-CM | POA: Diagnosis not present

## 2023-12-19 DIAGNOSIS — R569 Unspecified convulsions: Secondary | ICD-10-CM | POA: Diagnosis not present

## 2023-12-19 NOTE — Plan of Care (Signed)

## 2023-12-19 NOTE — Progress Notes (Signed)
 1      PROGRESS NOTE    Bob Ortiz  ZOX:[id number] DOB: February 08, 1941 DOA: 12/13/2023 PCP: Alena Hush, MD   Brief Narrative:   83 year old male with history of CAD with calcification, left bundle branch block, nonischemic cardiomyopathy, hypertension, chronic atrial fibrillation, on Eliquis , who presents ED for chief concerns of seizure-like activity.   5/13: Neurology, palliative care and cardiology consult  5/14: Per neurology concern of Lewy body dementia and patient need outpatient evaluation for final diagnosis.  No need for any antiseizure medications as he might be having spontaneous movements due to transient brain hypoxia with very low EF or a syncopal convulsion. PT is recommending SNF  5/15: Some overnight agitation requiring medication.  Cardiology ordered edecrin  50 mg for 2 doses and will evaluate volume status tomorrow.  Potassium at 3.4  5/16: Remained hemodynamically stable and no clinical change with edecrin , cardiology is recommending 50 mg daily as needed.  Appears little dry today so giving some gentle IV fluid.  5/17: Remains stable and clinically improving.  Pending insurance authorization for SNF  5/18: Remained hemodynamically stable, intermittent agitation.  Pending insurance authorization  Assessment & Plan:   Principal Problem:   Seizure (HCC) Active Problems:   Elevated troponin   Bilateral pleural effusion   Atrial fibrillation, chronic (HCC)   Essential hypertension   Transaminitis   NICM (nonischemic cardiomyopathy) (HCC)  * Seizure (HCC) Acute metabolic encephalopathy Seizure, fall, aspiration precaution Neurology following Intermittent hallucination reported in past hospital visits at [facility] and [facility].  He is also having visual hallucination here reported by nursing -CT head showing no acute intracranial abnormality -EEG showed no epileptic waves - Continue lorazepam  0.5 mg IV every 6 hours as needed for seizure -unable to rule out  neuro degenerative disease -No arrhythmia reported on CRT-P -Outpatient neurology follow-up for evaluation of concerning Lewy body dementia   Elevated troponin Due to supply/demand ischemia.  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  For SBP greater than 170   Bilateral pleural effusion Left > right. Cardio seen Received IV Lasix , now being transitioned to Edecrin  Strict I's and O's  GOC Overall poor prognosis. May have underlying neuro degenerative dz/dementia Palliative care c/s-family would like to keep him full code for now   DVT prophylaxis: Eliquis  Place TED hose Start: 12/13/23 1553 apixaban  (ELIQUIS ) tablet 5 mg     Code Status: Full Code Family Communication: No family at bedside today Disposition Plan: Medically stable for SNF   Consultants:  Neuro Palliative care Cardiology  Subjective: Patient was lying down in bed when seen today.  Denies any pain.  Has not eaten his breakfast and would like to get some help.  Objective: Vitals:   12/18/23 1654 12/18/23 2048 12/19/23 0527 12/19/23 0811  BP: 132/69 118/67 (!) 134/91 112/68  Pulse: 72 68 87 72  Resp: 18 15 16 16   Temp: 97.9 F (36.6 C) 98.1 F (36.7 C) 97.8 F (36.6 C) 97.7 F (36.5 C)  TempSrc: Oral Oral Oral Oral  SpO2: 99% 100% 100% 98%  Weight:      Height:        Intake/Output Summary (Last 24 hours) at 12/19/2023 1404 Last  data filed at 12/18/2023 1909 Gross per 24 hour  Intake 0 ml  Output 400 ml  Net -400 ml   Filed Weights   12/13/23 1901 12/15/23 0715  Weight: 83.9 kg 77.9 kg    Examination:  General.  Frail elderly man, in no acute distress. Pulmonary.  Lungs clear bilaterally, normal respiratory effort. CV.  Regular rate and rhythm, no JVD, rub or murmur. Abdomen.  Soft, nontender, nondistended, BS positive. CNS.  Alert and oriented to self.  No focal neurologic deficit. Extremities.  No edema, no cyanosis, pulses intact and symmetrical.  Data Reviewed: I have personally reviewed following labs and imaging studies  CBC: Recent Labs  Lab 12/13/23 1344 12/14/23 0451 12/15/23 0543 12/17/23 0525  WBC 9.2 6.9 7.6 9.0  NEUTROABS 6.8  --   --   --   HGB 13.6 12.1* 12.2* 12.4*  HCT 42.6 36.4* 35.8* 36.2*  MCV 100.0 94.8 91.3 91.0  PLT 207 189 184 199   Basic Metabolic Panel: Recent Labs  Lab 12/13/23 1344 12/14/23 0451 12/15/23 0543 12/16/23 0551 12/17/23 0525  NA 130* 130* 134* 135 134*  K 3.6 3.5 3.4* 3.4* 4.1  CL 96* 101 97* 97* 95*  CO2 23 22 29 28 27   GLUCOSE 100* 64* 85 85 95  BUN 13 10 13 15 18   CREATININE 0.92 0.74 0.90 0.81 0.94  CALCIUM 8.4* 7.4* 8.6* 8.8* 8.9  MG 2.1 2.0 2.0 1.9 1.7  PHOS 3.9  --   --   --   --    GFR: Estimated Creatinine Clearance: 60.6 mL/min (by C-G formula based on SCr of 0.94 mg/dL). Liver Function Tests: Recent Labs  Lab 12/13/23 1344 12/14/23 0451 12/15/23 0543 12/16/23 0551  AST 105* 69* 57* 43*  ALT 61* 47* 49* 43  ALKPHOS 131* 105 116 112  BILITOT 0.9 1.1 1.1 1.0  PROT 6.4* 5.4* 6.2* 6.3*  ALBUMIN 3.1* 2.7* 3.1* 3.3*   No results for input(s): "LIPASE", "AMYLASE" in the last 168 hours. No results for input(s): "AMMONIA" in the last 168 hours. Coagulation Profile: Recent Labs  Lab 12/13/23 1344  INR 1.4*    Thyroid Function Tests: No results for input(s): "TSH", "T4TOTAL", "FREET4", "T3FREE", "THYROIDAB" in the last 72  hours.  Anemia Panel: No results for input(s): "VITAMINB12", "FOLATE", "FERRITIN", "TIBC", "IRON", "RETICCTPCT" in the last 72 hours. Sepsis Labs: Recent Labs  Lab 12/13/23 1617 12/13/23 1846  LATICACIDVEN 1.7 1.0    Recent Results (from the past 240 hours)  Culture, blood (routine x 2)     Status: None   Collection Time: 12/13/23  4:17 PM   Specimen: BLOOD  Result Value Ref Range Status   Specimen Description BLOOD BLOOD RIGHT FOREARM  Final   Special Requests   Final    BOTTLES DRAWN AEROBIC AND ANAEROBIC Blood Culture adequate volume   Culture   Final    NO GROWTH 5 DAYS Performed at [facility], [address], [place], [place] [address]    Report Status 12/18/2023 FINAL  Final  Culture, blood (routine x 2)     Status: None   Collection Time: 12/13/23  4:18 PM   Specimen: BLOOD  Result Value Ref Range Status   Specimen Description BLOOD BLOOD RIGHT FOREARM  Final  Special Requests   Final    BOTTLES DRAWN AEROBIC AND ANAEROBIC Blood Culture adequate volume   Culture   Final    NO GROWTH 5 DAYS Performed at [facility], [address] [place], [place] [address]    Report Status 12/18/2023 FINAL  Final     Radiology Studies: No results found.  Scheduled Meds:  apixaban   5 mg Oral BID   cyanocobalamin   1,000 mcg Oral Daily   Continuous Infusions:     LOS: 6 days   Time spent: 40 mins  Luna Salinas, MD [facility] Pager [phone]  If 7PM-7AM, please contact night-coverage [email or web address]  12/19/2023, 2:04 PM

## 2023-12-19 NOTE — Plan of Care (Addendum)
 Patient is alert and oriented X 1. He has been trying to get up form bed multiple times and not able to follow any commands.  He is also trying to take off the tele and I/v line.Demonstrated poor safety awareness. Tele sitter at bedside. Denies any pain. Plan of care ongoing.   Problem: Education: Goal: Knowledge of General Education information will improve Description: Including pain rating scale, medication(s)/side effects and non-pharmacologic comfort measures Outcome: Progressing   Problem: Health Behavior/Discharge Planning: Goal: Ability to manage health-related needs will improve Outcome: Progressing   Problem: Clinical Measurements: Goal: Ability to maintain clinical measurements within normal limits will improve Outcome: Progressing Goal: Will remain free from infection Outcome: Progressing Goal: Diagnostic test results will improve Outcome: Progressing Goal: Respiratory complications will improve Outcome: Progressing Goal: Cardiovascular complication will be avoided Outcome: Progressing   Problem: Activity: Goal: Risk for activity intolerance will decrease Outcome: Progressing   Problem: Coping: Goal: Level of anxiety will decrease Outcome: Progressing   Problem: Elimination: Goal: Will not experience complications related to bowel motility Outcome: Progressing Goal: Will not experience complications related to urinary retention Outcome: Progressing   Problem: Pain Managment: Goal: General experience of comfort will improve and/or be controlled Outcome: Progressing

## 2023-12-20 DIAGNOSIS — R251 Tremor, unspecified: Secondary | ICD-10-CM | POA: Diagnosis not present

## 2023-12-20 DIAGNOSIS — R569 Unspecified convulsions: Secondary | ICD-10-CM | POA: Diagnosis not present

## 2023-12-20 LAB — COMPREHENSIVE METABOLIC PANEL WITH GFR
ALT: 36 U/L (ref 0–44)
AST: 43 U/L — ABNORMAL HIGH (ref 15–41)
Albumin: 2.8 g/dL — ABNORMAL LOW (ref 3.5–5.0)
Alkaline Phosphatase: 117 U/L (ref 38–126)
Anion gap: 11 (ref 5–15)
BUN: 16 mg/dL (ref 8–23)
CO2: 23 mmol/L (ref 22–32)
Calcium: 8.4 mg/dL — ABNORMAL LOW (ref 8.9–10.3)
Chloride: 93 mmol/L — ABNORMAL LOW (ref 98–111)
Creatinine, Ser: 0.67 mg/dL (ref 0.61–1.24)
GFR, Estimated: >60 mL/min (ref >60)
Glucose, Bld: 85 mg/dL (ref 70–99)
Potassium: 4.1 mmol/L (ref 3.5–5.1)
Sodium: 127 mmol/L — ABNORMAL LOW (ref 135–145)
Total Bilirubin: 1.4 mg/dL — ABNORMAL HIGH (ref 0.0–1.2)
Total Protein: 6.3 g/dL — ABNORMAL LOW (ref 6.5–8.1)

## 2023-12-20 LAB — CBC
HCT: 32.9 % — ABNORMAL LOW (ref 39.0–52.0)
Hemoglobin: 11.3 g/dL — ABNORMAL LOW (ref 13.0–17.0)
MCH: 31.2 pg (ref 26.0–34.0)
MCHC: 34.3 g/dL (ref 30.0–36.0)
MCV: 90.9 fL (ref 80.0–100.0)
Platelets: 232 10*3/uL (ref 150–400)
RBC: 3.62 MIL/uL — ABNORMAL LOW (ref 4.22–5.81)
RDW: 14.1 % (ref 11.5–15.5)
WBC: 7.7 10*3/uL (ref 4.0–10.5)
nRBC: 0 % (ref 0.0–0.2)

## 2023-12-20 NOTE — TOC Progression Note (Signed)
 Transition of Care [facility]) - Progression Note    Patient Details  Name: Bob Ortiz MRN: [id number] Date of Birth: [birth year]  Transition of Care [facility]) CM/SW Contact  Elsie Halo, RN Phone Number: 12/20/2023, 3:17 PM  Clinical Narrative:     Received Ins PlanAuthID: [id number] Dates: 5/18-5/21/25 Next Review Date: 12/22/23. TOC outreached to Antony Baumgartner at [facility] [phone]. They will accept the patient tomorrow.  Expected Discharge Plan: Skilled Nursing Facility Barriers to Discharge: Continued Medical Work up  Expected Discharge Plan and Services   Discharge Planning Services: CM Consult   Living arrangements for the past 2 months: Single Family Home                                       Social Determinants of Health (SDOH) Interventions SDOH Screenings   Food Insecurity: No Food Insecurity (12/13/2023)  Housing: Low Risk  (12/13/2023)  Transportation Needs: No Transportation Needs (12/13/2023)  Utilities: Not At Risk (12/13/2023)  Financial Resource Strain: Low Risk  (12/04/2023)   Received from [facility]  Physical Activity: Sufficiently Active (11/01/2023)   Received from [facility]  Social Connections: Moderately Isolated (12/13/2023)  Tobacco Use: Medium Risk (12/13/2023)    Readmission Risk Interventions     No data to display

## 2023-12-20 NOTE — Progress Notes (Signed)
 Physical Therapy Treatment Patient Details Name: Bob Ortiz MRN: [id number] DOB: 1940-08-18 Today's Date: 12/20/2023   History of Present Illness [name] is an 83 year old male with history of CAD with calcification, left bundle branch block, nonischemic cardiomyopathy, hypertension, chronic atrial fibrillation, on Eliquis , who presents ED for chief concerns of seizure-like activity.    PT Comments  Pt slightly more fatigued this Monday experiencing increased RR while ambulating in hallway with RW. HR 91, SpO2 96% on RA. Improved sequencing however with less flexion noted at knees while ambulating. Pt did have an episode of "miss stepping" requiring ModA to correct. Overall very pleasant, does require multimodal cues to complete simple tasks with increased time. Continue PT per POC. Will refer to Modility Specialist to increase frequency of mobility.   If plan is discharge home, recommend the following: A lot of help with walking and/or transfers;A lot of help with bathing/dressing/bathroom;Assist for transportation;Help with stairs or ramp for entrance   Can travel by private vehicle     Yes  Equipment Recommendations  Other (comment) (TBD at next level of care)    Recommendations for Other Services       Precautions / Restrictions Precautions Precautions: ICD/Pacemaker Recall of Precautions/Restrictions: Impaired Precaution/Restrictions Comments: PACEMAKER PRECAUTIONS L UE Restrictions Weight Bearing Restrictions Per Provider Order: No     Mobility  Bed Mobility               General bed mobility comments: N/T in chair pre/post session    Transfers Overall transfer level: Needs assistance Equipment used: Rolling walker (2 wheels) Transfers: Sit to/from Stand Sit to Stand: Contact guard assist           General transfer comment: Cues to limit push off on L UE due to pacemaker placement. Cues for technique. Increased time to process task     Ambulation/Gait Ambulation/Gait assistance: Contact guard assist, Min assist Gait Distance (Feet): 140 Feet Assistive device: Rolling walker (2 wheels) Gait Pattern/deviations: Step-to pattern, Knee flexed in stance - right, Knee flexed in stance - left, Shuffle, Drifts right/left Gait velocity: decreased     General Gait Details: Pt with improved sequencing steps during gait, yet required ModA to maintain balance while turning   Stairs             Wheelchair Mobility     Tilt Bed    Modified Rankin (Stroke Patients Only)       Balance Overall balance assessment: Needs assistance Sitting-balance support: Bilateral upper extremity supported Sitting balance-Leahy Scale: Fair     Standing balance support: Bilateral upper extremity supported, During functional activity, Reliant on assistive device for balance Standing balance-Leahy Scale: Poor Standing balance comment: High fall risk due to impaired gait and safety awareness                            Communication Communication Communication: Impaired Factors Affecting Communication: Reduced clarity of speech  Cognition Arousal: Lethargic Behavior During Therapy: Flat affect   PT - Cognitive impairments: History of cognitive impairments                       PT - Cognition Comments: ?Lewey Body Dementia Following commands: Impaired Following commands impaired: Follows one step commands inconsistently    Cueing Cueing Techniques: Verbal cues, Tactile cues, Visual cues  Exercises Other Exercises Other Exercises: Pt and family educated on role of PT current functional goals and plans  for STR. Discussed PLOF at home and current needs for increased assistance    General Comments        Pertinent Vitals/Pain Pain Assessment Pain Assessment: No/denies pain    Home Living                          Prior Function            PT Goals (current goals can now be found in the  care plan section) Acute Rehab PT Goals Patient Stated Goal: to return home Progress towards PT goals: Progressing toward goals    Frequency    Min 2X/week      PT Plan      Co-evaluation              AM-PAC PT "6 Clicks" Mobility   Outcome Measure  Help needed turning from your back to your side while in a flat bed without using bedrails?: A Lot Help needed moving from lying on your back to sitting on the side of a flat bed without using bedrails?: A Lot Help needed moving to and from a bed to a chair (including a wheelchair)?: A Lot Help needed standing up from a chair using your arms (e.g., wheelchair or bedside chair)?: A Little Help needed to walk in hospital room?: A Little Help needed climbing 3-5 steps with a railing? : A Lot 6 Click Score: 14    End of Session Equipment Utilized During Treatment: Gait belt Activity Tolerance: Patient tolerated treatment well;Patient limited by fatigue Patient left: in chair;with call bell/phone within reach;with chair alarm set;with family/visitor present Nurse Communication: Mobility status PT Visit Diagnosis: Unsteadiness on feet (R26.81);Other abnormalities of gait and mobility (R26.89);Repeated falls (R29.6);Muscle weakness (generalized) (M62.81);History of falling (Z91.81);Difficulty in walking, not elsewhere classified (R26.2)     Time: 2956-2130 PT Time Calculation (min) (ACUTE ONLY): 20 min  Charges:    $Therapeutic Activity: 8-22 mins PT General Charges $$ ACUTE PT VISIT: 1 Visit                    Melvyn Stagers, PTA  Diona Franklin 12/20/2023, 1:20 PM

## 2023-12-20 NOTE — Progress Notes (Signed)
 1      PROGRESS NOTE    Bob Ortiz  ZOX:[id number] DOB: [birth year] DOA: 12/13/2023 PCP: Alena Hush, MD   Brief Narrative:   83 year old male with history of CAD with calcification, left bundle branch block, nonischemic cardiomyopathy, hypertension, chronic atrial fibrillation, on Eliquis , who presents ED for chief concerns of seizure-like activity.   5/13: Neurology, palliative care and cardiology consult  5/14: Per neurology concern of Lewy body dementia and patient need outpatient evaluation for final diagnosis.  No need for any antiseizure medications as he might be having spontaneous movements due to transient brain hypoxia with very low EF or a syncopal convulsion. PT is recommending SNF  5/15: Some overnight agitation requiring medication.  Cardiology ordered edecrin  50 mg for 2 doses and will evaluate volume status tomorrow.  Potassium at 3.4  5/16: Remained hemodynamically stable and no clinical change with edecrin , cardiology is recommending 50 mg daily as needed.  Appears little dry today so giving some gentle IV fluid.  5/17: Remains stable and clinically improving.  Pending insurance authorization for SNF  5/18: Remained hemodynamically stable, intermittent agitation.  Pending insurance authorization  5/19: Still awaiting insurance authorization  Assessment & Plan:   Principal Problem:   Seizure (HCC) Active Problems:   Elevated troponin   Bilateral pleural effusion   Atrial fibrillation, chronic (HCC)   Essential hypertension   Transaminitis   NICM (nonischemic cardiomyopathy) (HCC)  * Seizure (HCC) Acute metabolic encephalopathy Seizure, fall, aspiration precaution Neurology following Intermittent hallucination reported in past hospital visits at [facility] and [facility].  He is also having visual hallucination here reported by nursing -CT head showing no acute intracranial abnormality -EEG showed no epileptic waves - Continue lorazepam  0.5 mg IV every 6  hours as needed for seizure -unable to rule out neuro degenerative disease -No arrhythmia reported on CRT-P -Outpatient neurology follow-up for evaluation of concerning Lewy body dementia  Hyponatremia.  Slight worsening of chronic mild hyponatremia with sodium at 127 today.  Clinically appears euvolemic no edema.  Does not require any diuretic recently. Encouraging p.o. hydration with electrolyte containing liquid -Monitor sodium   Elevated troponin Due to supply/demand ischemia.  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  For SBP greater than 170   Bilateral pleural effusion Left > right. Cardio seen Received IV Lasix , now being transitioned to Edecrin  Strict I's and O's  GOC Overall poor prognosis. May have underlying neuro degenerative dz/dementia Palliative care c/s-family would like to keep him full code for now   DVT prophylaxis: Eliquis  Place TED hose Start: 12/13/23 1553 apixaban  (ELIQUIS ) tablet 5 mg     Code Status: Full Code Family Communication: Wife and daughter at bedside Disposition Plan: Medically stable for SNF   Consultants:  Neuro Palliative care Cardiology  Subjective: Patient was eating breakfast when seen today.  No new concern.  Family at bedside  Objective: Vitals:   12/19/23 2121 12/20/23 0458 12/20/23 0831 12/20/23 1322  BP: (!) 140/76 123/74 132/80  Pulse: 86 78 83 85   Resp: 16 19 18    Temp: 98.3 F (36.8 C) 98 F (36.7 C) 98.3 F (36.8 C)   TempSrc:  Oral    SpO2: 100% 100% 95% 98%  Weight:      Height:        Intake/Output Summary (Last 24 hours) at 12/20/2023 1515 Last data filed at 12/20/2023 1110 Gross per 24 hour  Intake --  Output 200 ml  Net -200 ml   Filed Weights   12/13/23 1901 12/15/23 0715  Weight: 83.9 kg 77.9 kg    Examination:  General.  Frail elderly man, in no acute distress. Pulmonary.  Lungs clear bilaterally, normal respiratory effort. CV.  Regular rate and rhythm, no JVD, rub or murmur. Abdomen.  Soft, nontender, nondistended, BS positive. CNS.  Alert and oriented .  No focal neurologic deficit. Extremities.  No edema, no cyanosis, pulses intact and symmetrical.  Data Reviewed: I have personally reviewed following labs and imaging studies  CBC: Recent Labs  Lab 12/14/23 0451 12/15/23 0543 12/17/23 0525 12/20/23 0339  WBC 6.9 7.6 9.0 7.7  HGB 12.1* 12.2* 12.4* 11.3*  HCT 36.4* 35.8* 36.2* 32.9*  MCV 94.8 91.3 91.0 90.9  PLT 189 184 199 232   Basic Metabolic Panel: Recent Labs  Lab 12/14/23 0451 12/15/23 0543 12/16/23 0551 12/17/23 0525 12/20/23 0339  NA 130* 134* 135 134* 127*  K 3.5 3.4* 3.4* 4.1 4.1  CL 101 97* 97* 95* 93*  CO2 22 29 28 27 23   GLUCOSE 64* 85 85 95 85  BUN 10 13 15 18 16   CREATININE 0.74 0.90 0.81 0.94 0.67  CALCIUM 7.4* 8.6* 8.8* 8.9 8.4*  MG 2.0 2.0 1.9 1.7  --    GFR: Estimated Creatinine Clearance: 71.2 mL/min (by C-G formula based on SCr of 0.67 mg/dL). Liver Function Tests: Recent Labs  Lab 12/14/23 0451 12/15/23 0543 12/16/23 0551 12/20/23 0339  AST 69* 57* 43* 43*  ALT 47* 49* 43 36  ALKPHOS 105 116 112 117  BILITOT 1.1 1.1 1.0 1.4*  PROT 5.4* 6.2* 6.3* 6.3*  ALBUMIN 2.7* 3.1* 3.3* 2.8*   No results for input(s): "LIPASE", "AMYLASE" in the last 168 hours. No results for input(s): "AMMONIA" in the last 168 hours. Coagulation Profile: No results for  input(s): "INR", "PROTIME" in the last 168 hours.   Thyroid Function Tests: No results for input(s): "TSH", "T4TOTAL", "FREET4", "T3FREE", "THYROIDAB" in the last 72 hours.  Anemia Panel: No results for input(s): "VITAMINB12", "FOLATE", "FERRITIN", "TIBC", "IRON", "RETICCTPCT" in the last 72 hours. Sepsis Labs: Recent Labs  Lab 12/13/23 1617 12/13/23 1846  LATICACIDVEN 1.7 1.0    Recent Results (from the past 240 hours)  Culture, blood (routine x 2)     Status: None   Collection Time: 12/13/23  4:17 PM   Specimen: BLOOD  Result Value Ref Range Status   Specimen Description BLOOD BLOOD RIGHT FOREARM  Final   Special Requests   Final    BOTTLES DRAWN AEROBIC AND ANAEROBIC Blood Culture adequate volume   Culture   Final    NO GROWTH 5 DAYS Performed at [facility], [address], [place], [place] [address]    Report Status 12/18/2023 FINAL  Final  Culture, blood (routine x 2)     Status: None   Collection Time: 12/13/23  4:18 PM   Specimen: BLOOD  Result Value Ref Range Status   Specimen Description BLOOD BLOOD RIGHT FOREARM  Final  Special Requests   Final    BOTTLES DRAWN AEROBIC AND ANAEROBIC Blood Culture adequate volume   Culture   Final    NO GROWTH 5 DAYS Performed at [facility], [address], [place], [place] [address]    Report Status 12/18/2023 FINAL  Final     Radiology Studies: No results found.  Scheduled Meds:  apixaban   5 mg Oral BID   cyanocobalamin   1,000 mcg Oral Daily   Continuous Infusions:     LOS: 7 days   Time spent: 42 mins  Luna Salinas, MD [facility] Pager [phone]  If 7PM-7AM, please contact night-coverage [email or web address]  12/20/2023, 3:15 PM

## 2023-12-20 NOTE — Progress Notes (Signed)
 Occupational Therapy Treatment Patient Details Name: Bob Ortiz MRN: [id number] DOB: 1940-12-06 Today's Date: 12/20/2023   History of present illness [name] is an 83 year old male with history of CAD with calcification, left bundle branch block, nonischemic cardiomyopathy, hypertension, chronic atrial fibrillation, on Eliquis , who presents ED for chief concerns of seizure-like activity.   OT comments  Pt seen for OT tx this morning. Initially sleep, pt wakes easily to gentle verbal and tactile cues. Pt agreeable and family very supportive. Pt required MOD A for bed mobility and tolerated sitting for ~57min to complete seated EOB grooming tasks with set up. CGA to stand from slightly elevated bed and VC for hand placement with RW to maintain precautions with transfers. Pt ambulated in the room with CGA frontwards and backwards at least ~40' total with brief standing rest breaks. VSS however pt appeared slightly SOB. Pt progressing towards goals.      If plan is discharge home, recommend the following:  A lot of help with bathing/dressing/bathroom;Direct supervision/assist for medications management;Supervision due to cognitive status;Assist for transportation;Assistance with cooking/housework;A little help with walking and/or transfers   Equipment Recommendations  BSC/3in1;Tub/shower seat    Recommendations for Other Services      Precautions / Restrictions Precautions Precautions: ICD/Pacemaker Recall of Precautions/Restrictions: Impaired Precaution/Restrictions Comments: PACEMAKER PRECAUTIONS L UE Restrictions Weight Bearing Restrictions Per Provider Order: No       Mobility Bed Mobility Overal bed mobility: Needs Assistance Bed Mobility: Supine to Sit     Supine to sit: Mod assist, HOB elevated, Used rails          Transfers Overall transfer level: Needs assistance Equipment used: Rolling walker (2 wheels) Transfers: Sit to/from Stand Sit to Stand: Contact  guard assist, From elevated surface           General transfer comment: VC for LUE precautions     Balance Overall balance assessment: Needs assistance Sitting-balance support: Feet supported, No upper extremity supported, Single extremity supported Sitting balance-Leahy Scale: Fair     Standing balance support: Bilateral upper extremity supported, During functional activity, Reliant on assistive device for balance Standing balance-Leahy Scale: Poor Standing balance comment: High fall risk due to impaired gait and safety awareness                           ADL either performed or assessed with clinical judgement   ADL Overall ADL's : Needs assistance/impaired     Grooming: Wash/dry face;Oral care;Applying deodorant;Supervision/safety;Sitting;Standing;Contact guard assist Grooming Details (indicate cue type and reason): sitting to wash face and brush teeth with set up, CGA in standing to apply deodorant                             Functional mobility during ADLs: Contact guard assist;Rolling walker (2 wheels);Cueing for sequencing      Extremity/Trunk Assessment              Vision       Perception     Praxis     Communication Communication Communication: Impaired Factors Affecting Communication: Reduced clarity of speech   Cognition Arousal: Alert Behavior During Therapy: Flat affect Cognition: Cognition impaired           Executive functioning impairment (select all impairments): Problem solving, Sequencing, Initiation, Organization, Reasoning                   Following commands:  Impaired Following commands impaired: Follows one step commands inconsistently      Cueing   Cueing Techniques: Verbal cues, Tactile cues, Visual cues  Exercises      Shoulder Instructions       General Comments      Pertinent Vitals/ Pain       Pain Assessment Pain Assessment: No/denies pain  Home Living                                           Prior Functioning/Environment              Frequency  Min 2X/week        Progress Toward Goals  OT Goals(current goals can now be found in the care plan section)  Progress towards OT goals: Progressing toward goals  Acute Rehab OT Goals Patient Stated Goal: patient desires to return home OT Goal Formulation: With patient/family Time For Goal Achievement: 12/29/23 Potential to Achieve Goals: Good  Plan      Co-evaluation                 AM-PAC OT "6 Clicks" Daily Activity     Outcome Measure   Help from another person eating meals?: A Little Help from another person taking care of personal grooming?: A Little Help from another person toileting, which includes using toliet, bedpan, or urinal?: A Lot Help from another person bathing (including washing, rinsing, drying)?: A Lot Help from another person to put on and taking off regular upper body clothing?: A Lot Help from another person to put on and taking off regular lower body clothing?: A Lot 6 Click Score: 14    End of Session Equipment Utilized During Treatment: Gait belt;Rolling walker (2 wheels)  OT Visit Diagnosis: Unsteadiness on feet (R26.81);Other abnormalities of gait and mobility (R26.89);Muscle weakness (generalized) (M62.81)   Activity Tolerance Patient tolerated treatment well   Patient Left in chair;with call bell/phone within reach;with chair alarm set;with family/visitor present   Nurse Communication          Time: 9604-5409 OT Time Calculation (min): 36 min  Charges: OT General Charges $OT Visit: 1 Visit OT Treatments $Self Care/Home Management : 23-37 mins  Berenda Breaker., MPH, MS, OTR/L ascom [phone] 12/20/23, 1:31 PM

## 2023-12-20 NOTE — Progress Notes (Signed)
   12/20/23 0930  Spiritual Encounters  Type of Visit Follow up  Care provided to: Family  Referral source Chaplain assessment  Reason for visit Routine spiritual support  OnCall Visit No  Spiritual Framework  Presenting Themes Coping tools Data processing manager with wife and daughter about getting enough sleep, staying hydrated, and resting.)  Family Stress Factors Major life changes  Interventions  Spiritual Care Interventions Made Compassionate presence;Encouragement  Intervention Outcomes  Outcomes Awareness of support;Reduced anxiety   Spoke with daughter, [name], concerning next steps. Patient will be moved to [facility] for 1-2 weeks for rehab and then next steps will be discussed. [name] thanked chaplain for talking about this in the hallway instead of right in front of the patient as she feels it is upsetting to patient.

## 2023-12-21 DIAGNOSIS — R7989 Other specified abnormal findings of blood chemistry: Secondary | ICD-10-CM | POA: Diagnosis not present

## 2023-12-21 DIAGNOSIS — R251 Tremor, unspecified: Secondary | ICD-10-CM

## 2023-12-21 DIAGNOSIS — I509 Heart failure, unspecified: Secondary | ICD-10-CM

## 2023-12-21 DIAGNOSIS — I482 Chronic atrial fibrillation, unspecified: Secondary | ICD-10-CM | POA: Diagnosis not present

## 2023-12-21 MED ORDER — ETHACRYNIC ACID 25 MG PO TABS: 50.0000 mg | ORAL_TABLET | Freq: Every day | ORAL | Status: DC | PRN

## 2023-12-21 NOTE — TOC Transition Note (Signed)
 Transition of Care [facility]) - Discharge Note   Patient Details  Name: Bob Ortiz MRN: [id number] Date of Birth: 25-Jun-1941  [facility] CM/SW Contact:  Elsie Halo, RN Phone Number: 12/21/2023, 10:09 AM   Clinical Narrative:     Patient is medically clear to discharge to [facility] for str. TOC spoke with the patient's daughter [name] [phone]  and she is agreeable with the DC plan. [facility] will transport the patient.  Nurse to call report to  [phone]  Final next level of care: Skilled Nursing Facility Barriers to Discharge: Continued Medical Work up, English as a second language teacher   Patient Goals and CMS Choice            Discharge Placement              Patient chooses bed at: [facility] Patient to be transferred to facility by: [facility] Name of family member notified: [name] [phone] Patient and family notified of of transfer: 12/21/23  Discharge Plan and Services Additional resources added to the After Visit Summary for     Discharge Planning Services: CM Consult                                 Social Drivers of Health (SDOH) Interventions SDOH Screenings   Food Insecurity: No Food Insecurity (12/13/2023)  Housing: Low Risk  (12/13/2023)  Transportation Needs: No Transportation Needs (12/13/2023)  Utilities: Not At Risk (12/13/2023)  Financial Resource Strain: Low Risk  (12/04/2023)   Received from [facility]  Physical Activity: Sufficiently Active (11/01/2023)   Received from [facility]  Social Connections: Moderately Isolated (12/13/2023)  Tobacco Use: Medium Risk (12/13/2023)     Readmission Risk Interventions     No data to display

## 2023-12-21 NOTE — Discharge Summary (Signed)
 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 is an 83 year old male with history of CAD with calcification, left bundle branch block, nonischemic cardiomyopathy, hypertension, chronic atrial fibrillation, on Eliquis , who presents ED for chief concerns of seizure-like activity.  BNP was elevated at 788.7, hs troponin was 322.  Neurology, palliative care and cardiology was consulted.  Neurology with concern of Lewy body dementia and recommending outpatient evaluation for final diagnosis.No need for any antiseizure medications as he might be having spontaneous movements due to transient brain hypoxia with very low EF or a syncopal convulsion.  EEG was negative.  CT head was negative for any acute abnormality. Patient do have some intermittent agitation requiring medication.  Family concern of starting hallucinations with diuretics.  Patient did receive IV Lasix  while in the hospital and later switched to Edecrin  initially scheduled and now mediate as needed.  Patient with history of HFrEF with EF of about 20%, grade 3 diastolic dysfunction secondary to nonischemic cardiomyopathy.  He was on GDMT but has stopped taking that due to intolerance.  Currently will take low-dose Coreg and Edecrin  as needed.  And need to have a  close follow-up with cardiology for further assistance.  Patient was found to have mildly elevated troponin likely due to demand supply mismatch.  ACS ruled out.  Patient did had mild transaminitis on admission likely due to acute on chronic HFrEF with bilateral pleural effusion which continued to improve with diuresis. Also found to have bilateral pleural effusion likely with heart failure.  Initially required oxygen and later weaned back to room air.  Patient with overall poor prognosis due to significant underlying comorbidities and advanced age.  Palliative care was also consulted but family would like to keep him full code for now.  They are recommending outpatient palliative care and transitioning to hospice whenever patient and family is ready.  Our physical therapist also evaluated him and recommending going to rehab before returning home where he is being discharged to regain his strength.  Patient will continue on current medications need to have a close follow-up with his providers for further assistance.  Consultants: Cardiology.  Neurology.  Palliative care Procedures performed: EEG Disposition: Skilled nursing facility Diet recommendation:  Cardiac diet DISCHARGE MEDICATION: Allergies as of 12/21/2023       Reactions   Quinapril Swelling   Bumetanide  Other (See Comments)   Spironolactone Other (See Comments)   Torsemide Other (See Comments)        Medication List     STOP taking these medications    amiodarone 200 MG tablet Commonly known as: PACERONE   bumetanide  1 MG tablet Commonly known as: BUMEX    Entresto 24-26 MG Generic drug: sacubitril-valsartan   eplerenone 25 MG tablet Commonly known as: INSPRA   furosemide  40 MG tablet Commonly known as: LASIX    losartan 100 MG  tablet Commonly known as: COZAAR   metoprolol succinate 25 MG 24 hr tablet Commonly known as: TOPROL-XL   predniSONE 5 MG tablet Commonly known as: DELTASONE   spironolactone 25  MG tablet Commonly known as: ALDACTONE   thiamine 100 MG tablet Commonly known as: VITAMIN B1   torsemide 20 MG tablet Commonly known as: DEMADEX   traMADol  50 MG tablet Commonly known as: Ultram        TAKE these medications    carvedilol 3.125 MG tablet Commonly known as: COREG Take 3.125 mg by mouth in the morning and at bedtime.   cyanocobalamin  1000 MCG tablet Commonly known as: VITAMIN B12 Take 1,000 mcg by mouth daily.   Eliquis  5 MG Tabs tablet Generic drug: apixaban  Take 5 mg by mouth 2 (two) times daily.   empagliflozin 10 MG Tabs tablet Commonly known as: JARDIANCE Take 1 tablet by mouth daily.   ethacrynic  acid 25 MG tablet Commonly known as: EDECRIN  Take 2 tablets (50 mg total) by mouth daily as needed (For edema).   magnesium  oxide 400 (240 Mg) MG tablet Commonly known as: MAG-OX Take 2 tablets by mouth daily.   Melatonin 1 MG Chew Chew 1 tablet by mouth daily as needed.   omeprazole 40 MG capsule Commonly known as: PRILOSEC Take 40 mg by mouth daily.   Vitamin Ortiz-1000 Max St 25 MCG (1000 UT) tablet Generic drug: Cholecalciferol Take 1,000 Units by mouth daily. What changed: Another medication with the same name was removed. Continue taking this medication, and follow the directions you see here.        Contact information for follow-up providers     Callwood, Creig Doe D, MD. Go in 1 week(s).   Specialties: Cardiology, Internal Medicine Contact information: [address] [place] [address] [phone]         Bob Hush, MD. Schedule an appointment as soon as possible for a visit in 1 week(s).   Specialty: Internal Medicine Contact information: [address] [place] [address] [phone]              Contact information for after-discharge care     Destination     [facility] Preferred SNF .   Service: Skilled Nursing Contact  information: [address] [place]  [address] [phone]                    Discharge Exam: Bob Ortiz Weights   12/13/23 1901 12/15/23 0715  Weight: 83.9 kg 77.9 kg   General.  Frail gentleman, in no acute distress. Pulmonary.  Lungs clear bilaterally, normal respiratory effort. CV.  Regular rate and rhythm, no JVD, rub or murmur. Abdomen.  Soft, nontender, nondistended, BS positive. CNS.  Alert and oriented .  No focal neurologic deficit. Extremities.  No edema, no cyanosis, pulses intact and symmetrical.   Condition at discharge: stable  The results of significant diagnostics from this hospitalization (including imaging, microbiology, ancillary and laboratory) are listed below for reference.   Imaging Studies: DG Chest Port 1 View Result Date: 12/16/2023 CLINICAL DATA:  [id number] Congestive heart failure (HCC) 518841 EXAM: PORTABLE CHEST 1 VIEW COMPARISON:  Dec 13, 2023 FINDINGS: The cardiomediastinal silhouette is unchanged and enlarged in contour.LEFT chest cardiac pacing device. Atherosclerotic calcifications. Small LEFT pleural effusion, similar in comparison to prior. No pneumothorax. Favored slightly improved hilar interstitial prominence. IMPRESSION: 1. Favored slightly improved pulmonary edema. 2. Small LEFT pleural effusion, similar  in comparison to prior. Electronically Signed   By: Clancy Crimes M.Ortiz.   On: 12/16/2023 07:49   ECHOCARDIOGRAM COMPLETE Result Date: 12/15/2023    ECHOCARDIOGRAM REPORT   Patient Name:   MAEL DELAP Date of Exam: 12/14/2023 Medical Rec #:  [id number]    Height:       69.0 in Accession #:    [id number]   Weight:       185.0 lb Date of Birth:  1941/05/04     BSA:          1.999 m Patient Age:    83 years     BP:           122/85 mmHg Patient Gender: M            HR:           70 bpm. Exam Location:  [facility] Procedure: 2D Echo, Cardiac Doppler and Color Doppler (Both Spectral and Color            Flow Doppler were utilized  during procedure). Indications:     I46.9 Cardiac Arrest  History:         Patient has no prior history of Echocardiogram examinations.  Sonographer:     Brigid Canada RDCS Referring Phys:  [id number] CARALYN HUDSON Diagnosing Phys: Sabina Custovic IMPRESSIONS  1. Left ventricular ejection fraction, by estimation, is <20%. The left ventricle has severely decreased function. The left ventricle demonstrates global hypokinesis. The left ventricular internal cavity size was moderately dilated. Left ventricular diastolic parameters are consistent with Grade III diastolic dysfunction (restrictive).  2. Right ventricular systolic function is mildly reduced. The right ventricular size is mildly enlarged. There is mildly elevated pulmonary artery systolic pressure. The estimated right ventricular systolic pressure is 36.5 mmHg.  3. Left atrial size was severely dilated.  4. Right atrial size was moderately dilated.  5. Large pleural effusion.  6. The mitral valve is abnormal. Severe mitral valve regurgitation. No evidence of mitral stenosis.  7. The tricuspid valve is abnormal. Tricuspid valve regurgitation is moderate.  8. The aortic valve is normal in structure. Aortic valve regurgitation is not visualized. No aortic stenosis is present.  9. There is moderate dilatation of the ascending aorta. 10. The inferior vena cava is normal in size with greater than 50% respiratory variability, suggesting right atrial pressure of 3 mmHg. FINDINGS  Left Ventricle: Left ventricular ejection fraction, by estimation, is <20%. The left ventricle has severely decreased function. The left ventricle demonstrates global hypokinesis. The left ventricular internal cavity size was moderately dilated. There is no left ventricular hypertrophy. Left ventricular diastolic parameters are consistent with Grade III diastolic dysfunction (restrictive). Right Ventricle: The right ventricular size is mildly enlarged. No increase in right ventricular  wall thickness. Right ventricular systolic function is mildly reduced. There is mildly elevated pulmonary artery systolic pressure. The tricuspid regurgitant  velocity is 2.67 m/s, and with an assumed right atrial pressure of 8 mmHg, the estimated right ventricular systolic pressure is 36.5 mmHg. Left Atrium: Left atrial size was severely dilated. Right Atrium: Right atrial size was moderately dilated. Pericardium: There is no evidence of pericardial effusion. Mitral Valve: The mitral valve is abnormal. Severe mitral valve regurgitation. No evidence of mitral valve stenosis. Tricuspid Valve: The tricuspid valve is abnormal. Tricuspid valve regurgitation is moderate . No evidence of tricuspid stenosis. Aortic Valve: The aortic valve is normal in structure. Aortic valve regurgitation is not visualized. No aortic stenosis is present. Pulmonic Valve: The  pulmonic valve was normal in structure. Pulmonic valve regurgitation is not visualized. No evidence of pulmonic stenosis. Aorta: There is moderate dilatation of the ascending aorta. Venous: The inferior vena cava is normal in size with greater than 50% respiratory variability, suggesting right atrial pressure of 3 mmHg. IAS/Shunts: No atrial level shunt detected by color flow Doppler. Additional Comments: There is a large pleural effusion.  LEFT VENTRICLE PLAX 2D LVIDd:         6.40 cm   Diastology LVIDs:         5.50 cm   LV e' medial:    6.24 cm/s LV PW:         1.00 cm   LV E/e' medial:  11.4 LV IVS:        0.90 cm   LV e' lateral:   6.25 cm/s LVOT diam:     2.30 cm   LV E/e' lateral: 11.4 LV SV:         47 LV SV Index:   23 LVOT Area:     4.15 cm  RIGHT VENTRICLE            IVC RV Basal diam:  5.40 cm    IVC diam: 1.90 cm RV S prime:     8.98 cm/s TAPSE (M-mode): 1.4 cm LEFT ATRIUM              Index        RIGHT ATRIUM           Index LA diam:        5.10 cm  2.55 cm/m   RA Area:     24.00 cm LA Vol (A2C):   135.0 ml 67.55 ml/m  RA Volume:   77.80 ml  38.93  ml/m LA Vol (A4C):   106.0 ml 53.04 ml/m LA Biplane Vol: 123.0 ml 61.54 ml/m  AORTIC VALVE LVOT Vmax:   64.20 cm/s LVOT Vmean:  40.900 cm/s LVOT VTI:    0.113 m  AORTA Ao Root diam: 4.00 cm Ao Asc diam:  4.50 cm MITRAL VALVE               TRICUSPID VALVE MV Area (PHT): 4.49 cm    TR Peak grad:   28.5 mmHg MV Decel Time: 169 msec    TR Vmax:        267.00 cm/s MR Peak grad: 85.7 mmHg MR Mean grad: 57.0 mmHg    SHUNTS MR Vmax:      463.00 cm/s  Systemic VTI:  0.11 m MR Vmean:     356.0 cm/s   Systemic Diam: 2.30 cm MV E velocity: 71.30 cm/s MV A velocity: 26.70 cm/s MV E/A ratio:  2.67 Sabina Custovic Electronically signed by Isabell Manzanilla Signature Date/Time: 12/15/2023/1:54:24 PM    Final    EEG adult Result Date: 12/14/2023 Arleene Lack, MD     12/14/2023  4:55 PM Patient Name: MELQUIADES KOVAR MRN: [id number] Epilepsy Attending: Arleene Lack Referring Physician/Provider: Kimberley Penman, MD Date: 12/14/2023 Duration: 26.25 mins Patient history: 83yo M with seizure like activity. EEG to evaluate for seizure Level of alertness: Awake AEDs during EEG study: None Technical aspects: This EEG study was done with scalp electrodes positioned according to the 10-20 International system of electrode placement. Electrical activity was reviewed with band pass filter of 1-70Hz , sensitivity of 7 uV/mm, display speed of 71mm/sec with a 60Hz  notched filter applied as appropriate. EEG data were recorded continuously and digitally stored.  Video  monitoring was available and reviewed as appropriate. Description: The posterior dominant rhythm consists of 8 Hz activity of moderate voltage (25-35 uV) seen predominantly in posterior head regions, symmetric and reactive to eye opening and eye closing. Hyperventilation and photic stimulation were not performed.   IMPRESSION: This study is within normal limits. No seizures or epileptiform discharges were seen throughout the recording. A normal interictal EEG does not exclude the  diagnosis of epilepsy. Priyanka O Yadav   US  ABDOMEN LIMITED RUQ (LIVER/GB) Result Date: 12/13/2023 CLINICAL DATA:  Transaminitis EXAM: ULTRASOUND ABDOMEN LIMITED RIGHT UPPER QUADRANT COMPARISON:  Chest x-ray and chest CTA earlier 12/13/2023 FINDINGS: Gallbladder: Previous cholecystectomy. Common bile duct: Diameter: 2 mm Liver: No focal lesion identified. Within normal limits in parenchymal echogenicity. Portal vein is patent on color Doppler imaging with normal direction of blood flow towards the liver. Other: Right pleural effusion. Please correlate with prior x-ray and CTA. IMPRESSION: Previous cholecystectomy.  No ductal dilatation. Electronically Signed   By: Adrianna Horde M.Ortiz.   On: 12/13/2023 16:39   CT Head Wo Contrast Result Date: 12/13/2023 CLINICAL DATA:  Provided history: Mental status change, unknown cause. EXAM: CT HEAD WITHOUT CONTRAST TECHNIQUE: Contiguous axial images were obtained from the base of the skull through the vertex without intravenous contrast. RADIATION DOSE REDUCTION: This exam was performed according to the departmental dose-optimization program which includes automated exposure control, adjustment of the mA and/or kV according to patient size and/or use of iterative reconstruction technique. COMPARISON:  Head CT 09/01/2023. FINDINGS: Brain: Generalized cerebral atrophy. Patchy and ill-defined hypoattenuation within the cerebral white matter, nonspecific but compatible with mild chronic small vessel ischemic disease. There is no acute intracranial hemorrhage. No demarcated cortical infarct. No extra-axial fluid collection. No evidence of an intracranial mass. No midline shift. Vascular: No hyperdense vessel.  Atherosclerotic calcifications. Skull: No calvarial fracture or aggressive osseous lesion. Visible sinuses/orbits: No mass or acute finding within the imaged orbits. Minimal mucosal thickening within the right ethmoid and right sphenoid sinuses. Other: Trace left mastoid  effusion. IMPRESSION: 1. No evidence of an acute intracranial abnormality. 2. Parenchymal atrophy and chronic small vessel ischemic disease. 3. Minor paranasal sinus mucosal thickening at the imaged levels. 4. Trace left mastoid effusion. Electronically Signed   By: Bascom Lily Ortiz.O.   On: 12/13/2023 15:36   DG Chest 1 View Result Date: 12/13/2023 CLINICAL DATA:  Unresponsiveness EXAM: CHEST  1 VIEW COMPARISON:  March 22, 2017 FINDINGS: No change in the left subclavian bipolar ICDF pacemaker device tip of the leads in good position no evidence of discontinuity Bilateral interstitial prominence and small left effusion could correlate with congestive changes. Heart upper limits of normal. No pneumothorax. IMPRESSION: Congestive changes. Electronically Signed   By: Fredrich Jefferson M.Ortiz.   On: 12/13/2023 15:14   CT Angio Chest PE W/Cm &/Or Wo Cm Result Date: 12/13/2023 CLINICAL DATA:  Pulmonary embolism EXAM: CT ANGIOGRAPHY CHEST WITH CONTRAST TECHNIQUE: Multidetector CT imaging of the chest was performed using the standard protocol during bolus administration of intravenous contrast. Multiplanar CT image reconstructions and MIPs were obtained to evaluate the vascular anatomy. RADIATION DOSE REDUCTION: This exam was performed according to the departmental dose-optimization program which includes automated exposure control, adjustment of the mA and/or kV according to patient size and/or use of iterative reconstruction technique. CONTRAST:  75mL OMNIPAQUE  IOHEXOL  350 MG/ML SOLN COMPARISON:  None Available. FINDINGS: Cardiovascular: Satisfactory opacification of the pulmonary arteries to the segmental level. No evidence of pulmonary embolism. Normal heart size. No  pericardial effusion. Coronary artery calcifications, small to medium-sized pericardial effusion Mediastinum/Nodes: No enlarged mediastinal, hilar, or axillary lymph nodes. Thyroid gland, trachea, and esophagus demonstrate no significant findings.  Lungs/Pleura: Bilateral pleural effusions, left larger than right with basilar atelectasis. Interstitial prominence with ground-glass appearance could correlate with congestive changes versus superimposed pneumonitis. No evidence of pulmonary embolism. No aortic dissection on this limited enhanced pulmonary CT angiogram images Upper Abdomen: No acute abnormality. Musculoskeletal: No chest wall abnormality. No acute or significant osseous findings. Review of the MIP images confirms the above findings. IMPRESSION: *No evidence of pulmonary embolism. *Bilateral pleural effusions, left larger than right with basilar atelectasis. *Interstitial prominence with ground-glass appearance could correlate with congestive changes versus superimposed pneumonitis. *Coronary artery calcifications, small to medium-sized pericardial effusion. Electronically Signed   By: Fredrich Jefferson M.Ortiz.   On: 12/13/2023 15:09    Microbiology: Results for orders placed or performed during the hospital encounter of 12/13/23  Culture, blood (routine x 2)     Status: None   Collection Time: 12/13/23  4:17 PM   Specimen: BLOOD  Result Value Ref Range Status   Specimen Description BLOOD BLOOD RIGHT FOREARM  Final   Special Requests   Final    BOTTLES DRAWN AEROBIC AND ANAEROBIC Blood Culture adequate volume   Culture   Final    NO GROWTH 5 DAYS Performed at [facility], [address] [place], [place] [address]    Report Status 12/18/2023 FINAL  Final  Culture, blood (routine x 2)     Status: None   Collection Time: 12/13/23  4:18 PM   Specimen: BLOOD  Result Value Ref Range Status   Specimen Description BLOOD BLOOD RIGHT FOREARM  Final   Special Requests   Final    BOTTLES DRAWN AEROBIC AND ANAEROBIC Blood Culture adequate volume   Culture   Final    NO GROWTH 5 DAYS Performed at [facility], [address] [place], [place] [address]    Report Status 12/18/2023 FINAL  Final    Labs: CBC: Recent  Labs  Lab 12/15/23 0543 12/17/23 0525 12/20/23 0339  WBC 7.6 9.0 7.7  HGB 12.2* 12.4* 11.3*  HCT 35.8* 36.2* 32.9*  MCV 91.3 91.0 90.9  PLT 184 199 232   Basic Metabolic Panel: Recent Labs  Lab 12/15/23 0543 12/16/23 0551 12/17/23 0525 12/20/23 0339  NA 134* 135 134* 127*  K 3.4* 3.4* 4.1 4.1  CL 97* 97* 95* 93*  CO2 29 28 27 23   GLUCOSE 85 85 95 85  BUN 13 15 18 16   CREATININE 0.90 0.81 0.94 0.67  CALCIUM 8.6* 8.8* 8.9 8.4*  MG 2.0 1.9 1.7  --    Liver Function Tests: Recent Labs  Lab 12/15/23 0543 12/16/23 0551 12/20/23 0339  AST 57* 43* 43*  ALT 49* 43 36  ALKPHOS 116 112 117  BILITOT 1.1 1.0 1.4*  PROT 6.2* 6.3* 6.3*  ALBUMIN 3.1* 3.3* 2.8*   CBG: No results for input(s): "GLUCAP" in the last 168 hours.  Discharge time spent: greater than 30 minutes.  This record has been created using Conservation officer, historic buildings. Errors have been sought and corrected,but may not always be located. Such creation errors do not reflect on the standard of care.   Signed: Luna Salinas, MD [facility] 12/21/2023

## 2023-12-21 NOTE — Plan of Care (Signed)

## 2023-12-21 NOTE — Progress Notes (Signed)
 Report called to nurse Shelvy Dickens at [facility] all questions addressed at time of report.

## 2024-04-06 ENCOUNTER — Emergency Department

## 2024-04-06 ENCOUNTER — Other Ambulatory Visit: Payer: Self-pay

## 2024-04-06 ENCOUNTER — Inpatient Hospital Stay
Admission: EM | Admit: 2024-04-06 | Discharge: 2024-04-17 | DRG: 641 | Disposition: A | Discharge status: Skilled Nursing Facility | Attending: Internal Medicine | Admitting: Internal Medicine

## 2024-04-06 DIAGNOSIS — Z823 Family history of stroke: Secondary | ICD-10-CM

## 2024-04-06 DIAGNOSIS — I480 Paroxysmal atrial fibrillation: Secondary | ICD-10-CM | POA: Diagnosis present

## 2024-04-06 DIAGNOSIS — Z79899 Other long term (current) drug therapy: Secondary | ICD-10-CM

## 2024-04-06 DIAGNOSIS — R824 Acetonuria: Secondary | ICD-10-CM | POA: Diagnosis not present

## 2024-04-06 DIAGNOSIS — E271 Primary adrenocortical insufficiency: Secondary | ICD-10-CM | POA: Diagnosis present

## 2024-04-06 DIAGNOSIS — R296 Repeated falls: Secondary | ICD-10-CM | POA: Diagnosis present

## 2024-04-06 DIAGNOSIS — E162 Hypoglycemia, unspecified: Secondary | ICD-10-CM | POA: Diagnosis present

## 2024-04-06 DIAGNOSIS — G3183 Dementia with Lewy bodies: Secondary | ICD-10-CM | POA: Diagnosis present

## 2024-04-06 DIAGNOSIS — R634 Abnormal weight loss: Secondary | ICD-10-CM | POA: Diagnosis present

## 2024-04-06 DIAGNOSIS — Z8249 Family history of ischemic heart disease and other diseases of the circulatory system: Secondary | ICD-10-CM

## 2024-04-06 DIAGNOSIS — Z7901 Long term (current) use of anticoagulants: Secondary | ICD-10-CM

## 2024-04-06 DIAGNOSIS — Z888 Allergy status to other drugs, medicaments and biological substances status: Secondary | ICD-10-CM

## 2024-04-06 DIAGNOSIS — Z7952 Long term (current) use of systemic steroids: Secondary | ICD-10-CM

## 2024-04-06 DIAGNOSIS — I482 Chronic atrial fibrillation, unspecified: Secondary | ICD-10-CM | POA: Diagnosis present

## 2024-04-06 DIAGNOSIS — Z515 Encounter for palliative care: Secondary | ICD-10-CM

## 2024-04-06 DIAGNOSIS — Z66 Do not resuscitate: Secondary | ICD-10-CM | POA: Diagnosis present

## 2024-04-06 DIAGNOSIS — Z87891 Personal history of nicotine dependence: Secondary | ICD-10-CM

## 2024-04-06 DIAGNOSIS — I2510 Atherosclerotic heart disease of native coronary artery without angina pectoris: Secondary | ICD-10-CM | POA: Diagnosis present

## 2024-04-06 DIAGNOSIS — Z6822 Body mass index (BMI) 22.0-22.9, adult: Secondary | ICD-10-CM

## 2024-04-06 DIAGNOSIS — F0282 Dementia in other diseases classified elsewhere, unspecified severity, with psychotic disturbance: Secondary | ICD-10-CM | POA: Diagnosis present

## 2024-04-06 DIAGNOSIS — I10 Essential (primary) hypertension: Secondary | ICD-10-CM | POA: Diagnosis not present

## 2024-04-06 DIAGNOSIS — I110 Hypertensive heart disease with heart failure: Secondary | ICD-10-CM | POA: Diagnosis present

## 2024-04-06 DIAGNOSIS — E871 Hypo-osmolality and hyponatremia: Secondary | ICD-10-CM | POA: Diagnosis not present

## 2024-04-06 DIAGNOSIS — W19XXXA Unspecified fall, initial encounter: Principal | ICD-10-CM

## 2024-04-06 DIAGNOSIS — E860 Dehydration: Secondary | ICD-10-CM | POA: Diagnosis present

## 2024-04-06 DIAGNOSIS — K219 Gastro-esophageal reflux disease without esophagitis: Secondary | ICD-10-CM | POA: Diagnosis present

## 2024-04-06 DIAGNOSIS — Z1152 Encounter for screening for COVID-19: Secondary | ICD-10-CM

## 2024-04-06 DIAGNOSIS — Z950 Presence of cardiac pacemaker: Secondary | ICD-10-CM

## 2024-04-06 DIAGNOSIS — F02811 Dementia in other diseases classified elsewhere, unspecified severity, with agitation: Secondary | ICD-10-CM | POA: Diagnosis present

## 2024-04-06 DIAGNOSIS — E039 Hypothyroidism, unspecified: Secondary | ICD-10-CM | POA: Diagnosis present

## 2024-04-06 DIAGNOSIS — I428 Other cardiomyopathies: Secondary | ICD-10-CM | POA: Diagnosis present

## 2024-04-06 DIAGNOSIS — Z800 Family history of malignant neoplasm of digestive organs: Secondary | ICD-10-CM

## 2024-04-06 DIAGNOSIS — I5042 Chronic combined systolic (congestive) and diastolic (congestive) heart failure: Secondary | ICD-10-CM | POA: Diagnosis present

## 2024-04-06 DIAGNOSIS — I447 Left bundle-branch block, unspecified: Secondary | ICD-10-CM | POA: Diagnosis present

## 2024-04-06 DIAGNOSIS — D649 Anemia, unspecified: Secondary | ICD-10-CM | POA: Diagnosis present

## 2024-04-06 LAB — CBC WITH DIFFERENTIAL/PLATELET
Abs Immature Granulocytes: 0.02 K/uL (ref 0.00–0.07)
Basophils Absolute: 0 K/uL (ref 0.0–0.1)
Basophils Relative: 1 %
Eosinophils Absolute: 0.1 K/uL (ref 0.0–0.5)
Eosinophils Relative: 1 %
HCT: 37.4 % — ABNORMAL LOW (ref 39.0–52.0)
Hemoglobin: 12.4 g/dL — ABNORMAL LOW (ref 13.0–17.0)
Immature Granulocytes: 0 %
Lymphocytes Relative: 17 %
Lymphs Abs: 0.9 K/uL (ref 0.7–4.0)
MCH: 28.7 pg (ref 26.0–34.0)
MCHC: 33.2 g/dL (ref 30.0–36.0)
MCV: 86.6 fL (ref 80.0–100.0)
Monocytes Absolute: 0.5 K/uL (ref 0.1–1.0)
Monocytes Relative: 10 %
Neutro Abs: 3.7 K/uL (ref 1.7–7.7)
Neutrophils Relative %: 71 %
Platelets: 174 K/uL (ref 150–400)
RBC: 4.32 MIL/uL (ref 4.22–5.81)
RDW: 16.4 % — ABNORMAL HIGH (ref 11.5–15.5)
WBC: 5.2 K/uL (ref 4.0–10.5)
nRBC: 0 % (ref 0.0–0.2)

## 2024-04-06 LAB — COMPREHENSIVE METABOLIC PANEL WITH GFR
ALT: 27 U/L (ref 0–44)
AST: 54 U/L — ABNORMAL HIGH (ref 15–41)
Albumin: 3 g/dL — ABNORMAL LOW (ref 3.5–5.0)
Alkaline Phosphatase: 143 U/L — ABNORMAL HIGH (ref 38–126)
Anion gap: 12 (ref 5–15)
BUN: 14 mg/dL (ref 8–23)
CO2: 22 mmol/L (ref 22–32)
Calcium: 8.2 mg/dL — ABNORMAL LOW (ref 8.9–10.3)
Chloride: 100 mmol/L (ref 98–111)
Creatinine, Ser: 0.55 mg/dL — ABNORMAL LOW (ref 0.61–1.24)
GFR, Estimated: >60 mL/min (ref >60)
Glucose, Bld: 62 mg/dL — ABNORMAL LOW (ref 70–99)
Potassium: 4 mmol/L (ref 3.5–5.1)
Sodium: 134 mmol/L — ABNORMAL LOW (ref 135–145)
Total Bilirubin: 1.3 mg/dL — ABNORMAL HIGH (ref 0.0–1.2)
Total Protein: 6.7 g/dL (ref 6.5–8.1)

## 2024-04-06 LAB — URINALYSIS, ROUTINE W REFLEX MICROSCOPIC
Bilirubin Urine: NEGATIVE
Glucose, UA: NEGATIVE mg/dL
Hgb urine dipstick: NEGATIVE
Ketones, ur: 20 mg/dL — AB
Leukocytes,Ua: NEGATIVE
Nitrite: NEGATIVE
Protein, ur: NEGATIVE mg/dL
Specific Gravity, Urine: 1.017 (ref 1.005–1.030)
pH: 5 (ref 5.0–8.0)

## 2024-04-06 LAB — CBG MONITORING, ED: Glucose-Capillary: 103 mg/dL — ABNORMAL HIGH (ref 70–99)

## 2024-04-06 LAB — RESP PANEL BY RT-PCR (RSV, FLU A&B, COVID)  RVPGX2
Influenza A by PCR: NEGATIVE
Influenza B by PCR: NEGATIVE
Resp Syncytial Virus by PCR: NEGATIVE
SARS Coronavirus 2 by RT PCR: NEGATIVE

## 2024-04-06 LAB — MAGNESIUM: Magnesium: 1.9 mg/dL (ref 1.7–2.4)

## 2024-04-06 LAB — BRAIN NATRIURETIC PEPTIDE: B Natriuretic Peptide: 215.8 pg/mL — ABNORMAL HIGH (ref 0.0–100.0)

## 2024-04-06 LAB — TROPONIN I (HIGH SENSITIVITY)
Troponin I (High Sensitivity): 25 ng/L — ABNORMAL HIGH (ref <18)
Troponin I (High Sensitivity): 28 ng/L — ABNORMAL HIGH (ref <18)

## 2024-04-06 MED ORDER — TRAZODONE HCL 50 MG PO TABS
25.0000 mg | ORAL_TABLET | Freq: Every evening | ORAL | Status: DC | PRN
  Administered 2024-04-07 – 2024-04-14 (×3): 25 mg via ORAL
  Filled 2024-04-06 (×3): qty 1

## 2024-04-06 MED ORDER — ACETAMINOPHEN 325 MG PO TABS: 650.0000 mg | ORAL_TABLET | Freq: Four times a day (QID) | ORAL | Status: DC | PRN

## 2024-04-06 MED ORDER — ETHACRYNIC ACID 25 MG PO TABS: 50.0000 mg | ORAL_TABLET | Freq: Every day | ORAL | Status: DC | PRN

## 2024-04-06 MED ORDER — ONDANSETRON HCL 4 MG PO TABS
4.0000 mg | ORAL_TABLET | Freq: Four times a day (QID) | ORAL | Status: DC | PRN
Start: 2024-04-06 — End: 2024-04-17

## 2024-04-06 MED ORDER — MAGNESIUM OXIDE -MG SUPPLEMENT 400 (240 MG) MG PO TABS
800.0000 mg | ORAL_TABLET | Freq: Every day | ORAL | Status: DC
  Administered 2024-04-07 – 2024-04-17 (×10): 800 mg via ORAL
  Filled 2024-04-06 (×10): qty 2

## 2024-04-06 MED ORDER — LACTATED RINGERS IV BOLUS
500.0000 mL | Freq: Once | INTRAVENOUS | Status: AC
  Administered 2024-04-06: 500 mL via INTRAVENOUS

## 2024-04-06 MED ORDER — ACETAMINOPHEN 650 MG RE SUPP: 650.0000 mg | Freq: Four times a day (QID) | RECTAL | Status: DC | PRN

## 2024-04-06 MED ORDER — VITAMIN D 25 MCG (1000 UNIT) PO TABS
1000.0000 [IU] | ORAL_TABLET | Freq: Every day | ORAL | Status: DC
  Administered 2024-04-07 – 2024-04-17 (×11): 1000 [IU] via ORAL
  Filled 2024-04-06 (×11): qty 1

## 2024-04-06 MED ORDER — ONDANSETRON HCL 4 MG/2ML IJ SOLN: 4.0000 mg | Freq: Four times a day (QID) | INTRAMUSCULAR | Status: DC | PRN

## 2024-04-06 MED ORDER — MAGNESIUM HYDROXIDE 400 MG/5ML PO SUSP: 30.0000 mL | Freq: Every day | ORAL | Status: DC | PRN

## 2024-04-06 MED ORDER — VITAMIN B-12 1000 MCG PO TABS
1000.0000 ug | ORAL_TABLET | Freq: Every day | ORAL | Status: DC
  Administered 2024-04-07 – 2024-04-17 (×11): 1000 ug via ORAL
  Filled 2024-04-06 (×11): qty 1

## 2024-04-06 MED ORDER — DEXTROSE-SODIUM CHLORIDE 5-0.45 % IV SOLN: INTRAVENOUS | Status: AC

## 2024-04-06 MED ORDER — APIXABAN 5 MG PO TABS
5.0000 mg | ORAL_TABLET | Freq: Two times a day (BID) | ORAL | Status: DC
  Administered 2024-04-07 – 2024-04-17 (×22): 5 mg via ORAL
  Filled 2024-04-06 (×22): qty 1

## 2024-04-06 NOTE — ED Notes (Signed)
 Family arrived to bedside, wife and daughter

## 2024-04-06 NOTE — ED Notes (Signed)
 Attempted to assist patient to ambulate. Pt unable to stand. Claudene, MD made aware.

## 2024-04-06 NOTE — ED Notes (Signed)
 ED Provider at bedside.

## 2024-04-06 NOTE — H&P (Incomplete)
 [place]   PATIENT NAME: Bob Ortiz    MR#:  [id number]  DATE OF BIRTH:  03/01/1941  DATE OF ADMISSION:  04/06/2024  PRIMARY CARE PHYSICIAN: Salli Amato, MD   Patient is coming from: Home  REQUESTING/REFERRING PHYSICIAN: Cyrena Mylar, MD  CHIEF COMPLAINT:   Chief Complaint  Patient presents with   Fall    HISTORY OF PRESENT ILLNESS:  Bob Ortiz is a 83 y.o. Caucasian male with medical history significant for paroxysmal atrial fibrillation status post pacemaker, nonischemic cardiomyopathy, combined systolic and diastolic CHF, Addison's disease, hypothyroidism and left bundle blanch block. who presented to the emergency room with acute onset of generalized weakness and subsequent fall with inability to ambulate.  The patient was found to be hypoglycemic in the ER.  He denied any presyncope or syncope.  No dysuria, oliguria or hematuria or flank pain.  No cough or wheezing or dyspnea.  No chest pain or palpitations.  He does not recall hitting his head.  Per his wife he has lost significant weight over the last several weeks.  He was 119 advised to lose weight today 185 however he went down to 160 in an unintentional way per his wife.  ED Course: Upon presentation to the emergency room vital signs were within normal.  Labs revealed sodium level of 134, glucose 62 and calcium 8.2 with alk phos 143 and albumin 3 with AST 54 and total bili 1.3.  BN P was 215.8 and high-sensitivity troponin I was 25 and then 28.  CBC showed mild anemia better than previous levels.  Repeat blood glucose was 85. EKG as reviewed by me : EKG showed atrial fibrillation with controlled ventricular sponsor of 70 with T wave inversion anteroseptally and laterally and Q waves inferiorly. Imaging: Noncontrasted head CT scan revealed no acute intracranial normalities.  C-spine CT showed no fracture or traumatic listhesis.  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  The patient was given 500 mL IV lactated ringer  bolus.  He will be admitted to a medical telemetry observation bed for further evaluation and management.  PAST MEDICAL HISTORY:   Paroxysmal atrial fibrillation, nonischemic cardiomyopathy, combined systolic and diastolic CHF, Addison's disease, hypothyroidism and left bundle blanch block.  PAST SURGICAL HISTORY:   Past Surgical History:  Procedure Laterality Date   FRACTURE SURGERY Right    shoulder  -Pacemaker placement  SOCIAL HISTORY:   Social History   Tobacco Use   Smoking status: Former    Current packs/day: 0.00    Types: Cigarettes    Quit date: 1975    Years since quitting: 50.7   Smokeless tobacco: Never  Substance Use Topics   Alcohol use: Yes    Comment: occassionally    FAMILY HISTORY:   Family History  Problem Relation Age of Onset   Stomach cancer Mother    Heart attack Father 69   Stroke Father     DRUG ALLERGIES:   Allergies  Allergen Reactions   Quinapril Swelling   Bumetanide  Other (See Comments)   Spironolactone Other (See Comments)   Torsemide Other (See Comments)    REVIEW OF SYSTEMS:   ROS As per history of present illness. All pertinent systems were reviewed above. Constitutional, HEENT, cardiovascular, respiratory, GI, GU, musculoskeletal, neuro, psychiatric, endocrine, integumentary and hematologic systems were reviewed and are otherwise negative/unremarkable except for positive findings mentioned above in the HPI.   MEDICATIONS AT HOME:   Prior to Admission  medications   Medication Sig Start Date End Date Taking? Authorizing Provider  Cholecalciferol  (VITAMIN D -1000 MAX ST) 25 MCG (1000 UT) tablet Take 1,000 Units by mouth daily.   Yes [provider]  cyanocobalamin  (VITAMIN B12) 1000 MCG tablet Take 1,000 mcg by mouth daily. 08/24/23 08/23/24 Yes [provider]  ELIQUIS  5 MG TABS tablet Take 5 mg by mouth 2 (two) times daily. 12/02/22  Yes [provider]  carvedilol (COREG) 3.125 MG tablet Take 3.125 mg by mouth in the morning and at bedtime. Patient not taking: Reported on 12/13/2023    [provider]  empagliflozin (JARDIANCE) 10 MG TABS tablet Take 1 tablet by mouth daily. Patient not taking: Reported on 12/13/2023 12/12/23 12/11/24  [provider]  ethacrynic  acid (EDECRIN ) 25 MG tablet Take 2 tablets (50 mg total) by mouth daily as needed (For edema). Patient not taking: Reported on 04/06/2024 12/21/23   Amin, Sumayya, MD  magnesium  oxide (MAG-OX) 400 (240 Mg) MG tablet Take 2 tablets by mouth daily. 11/01/23   [provider]  Melatonin 1 MG CHEW Chew 1 tablet by mouth daily as needed.    [provider]  omeprazole (PRILOSEC) 40 MG capsule Take 40 mg by mouth daily. Patient not taking: Reported on 12/13/2023    [provider]      VITAL SIGNS:  Blood pressure 123/78, pulse 71, temperature 97.6 F (36.4 C), temperature source Oral, resp. rate 20, weight 68.2 kg, SpO2 100%.  PHYSICAL EXAMINATION:  Physical Exam  GENERAL:  83 y.o.-year-old Caucasian male patient lying in the bed with no acute distress.  EYES: Pupils equal, round, reactive to light and accommodation. No scleral icterus. Extraocular muscles intact.  HEENT: Head atraumatic, normocephalic. Oropharynx and nasopharynx clear.  NECK:  Supple, no jugular venous distention. No thyroid enlargement, no tenderness.  LUNGS: Normal breath sounds bilaterally, no wheezing, rales,rhonchi or crepitation. No use of accessory muscles of respiration.  CARDIOVASCULAR: Regular rate and rhythm, S1, S2 normal. No murmurs, rubs, or gallops.  ABDOMEN: Soft, nondistended, nontender. Bowel sounds present. No organomegaly or mass.  EXTREMITIES: No pedal edema, cyanosis, or clubbing.  NEUROLOGIC: Cranial nerves II through XII are intact. Muscle strength 5/5 in all extremities. Sensation intact. Gait not checked.  PSYCHIATRIC: The patient is  alert and oriented x 3.  Normal affect and good eye contact. SKIN: No obvious rash, lesion, or ulcer.   LABORATORY PANEL:   CBC Recent Labs  Lab 04/07/24 0524  WBC 5.4  HGB 11.1*  HCT 33.2*  PLT 172   ------------------------------------------------------------------------------------------------------------------  Chemistries  Recent Labs  Lab 04/06/24 2014 04/07/24 0524  NA 134* 133*  K 4.0 3.8  CL 100 99  CO2 22 28  GLUCOSE 62* 85  BUN 14 14  CREATININE 0.55* 0.72  CALCIUM 8.2* 8.1*  MG 1.9  --   AST 54*  --   ALT 27  --   ALKPHOS 143*  --   BILITOT 1.3*  --    ------------------------------------------------------------------------------------------------------------------  Cardiac Enzymes No results for input(s): TROPONINI in the last 168 hours. ------------------------------------------------------------------------------------------------------------------  RADIOLOGY:  CT HEAD WO CONTRAST ( ) Result Date: 04/06/2024 CLINICAL DATA:  falls on AC, eval ich; fall EXAM: CT HEAD WITHOUT CONTRAST CT CERVICAL SPINE WITHOUT CONTRAST TECHNIQUE: Multidetector CT imaging of the head and cervical spine was performed following the standard protocol without intravenous contrast. Multiplanar CT image reconstructions of the cervical spine were also generated. RADIATION DOSE REDUCTION: This exam was performed according to the  departmental dose-optimization program which includes automated exposure control, adjustment of the mA and/or kV according to patient size and/or use of iterative reconstruction technique. COMPARISON:  None Available. FINDINGS: CT HEAD FINDINGS Brain: No evidence of large-territorial acute infarction. No parenchymal hemorrhage. No mass lesion. No extra-axial collection. No mass effect or midline shift. No hydrocephalus. Basilar cisterns are patent. Vascular: No hyperdense vessel. Atherosclerotic calcifications are present within the cavernous internal carotid  arteries. Skull: No acute fracture or focal lesion. Sinuses/Orbits: Paranasal sinuses and mastoid air cells are clear. The orbits are unremarkable. Other: None. CT CERVICAL SPINE FINDINGS Alignment: Grade 1 anterolisthesis of C2 on C3. Skull base and vertebrae: Multilevel severe degenerative change of the spine. 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 No acute fracture. No aggressive appearing focal osseous lesion or focal pathologic process. Soft tissues and spinal canal: No prevertebral fluid or swelling. No visible canal hematoma. Upper chest: Biapical paraseptal on sinus changes. Other: None. IMPRESSION: 1. No acute intracranial abnormality. 2. No acute displaced fracture or traumatic listhesis of the cervical spine. 3. 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 Electronically Signed   By: Morgane  Naveau M.D.   On: 04/06/2024 20:59   CT Cervical Spine Wo Contrast Result Date: 04/06/2024 CLINICAL DATA:  falls on AC, eval ich; fall EXAM: CT HEAD WITHOUT CONTRAST CT CERVICAL SPINE WITHOUT CONTRAST TECHNIQUE: Multidetector CT imaging of the head and cervical spine was performed following the standard protocol without intravenous contrast. Multiplanar CT image reconstructions of the cervical spine were also generated. RADIATION DOSE REDUCTION: This exam was performed according to the departmental dose-optimization program which includes automated exposure control, adjustment of the mA and/or kV according to patient size and/or use of iterative reconstruction technique. COMPARISON:  None Available. FINDINGS: CT HEAD FINDINGS Brain: No evidence of large-territorial acute infarction. No parenchymal hemorrhage. No mass lesion. No extra-axial collection. No mass effect or midline shift. No hydrocephalus. Basilar cisterns are patent. Vascular: No hyperdense vessel. Atherosclerotic calcifications are present within the cavernous internal  carotid arteries. Skull: No acute fracture or focal lesion. Sinuses/Orbits: Paranasal sinuses and mastoid air cells are clear. The orbits are unremarkable. Other: None. CT CERVICAL SPINE FINDINGS Alignment: Grade 1 anterolisthesis of C2 on C3. Skull base and vertebrae: Multilevel severe degenerative change of the spine. 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 No acute fracture. No aggressive appearing focal osseous lesion or focal pathologic process. Soft tissues and spinal canal: No prevertebral fluid or swelling. No visible canal hematoma. Upper chest: Biapical paraseptal on sinus changes. Other: None. IMPRESSION: 1. No acute intracranial abnormality. 2. No acute displaced fracture or traumatic listhesis of the cervical spine. 3. 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 Electronically Signed   By: Morgane  Naveau M.D.   On: 04/06/2024 20:59   DG Chest Portable 1 View Result Date: 04/06/2024 CLINICAL DATA:  Fall EXAM: PORTABLE CHEST 1 VIEW COMPARISON:  Chest x-ray 12/17/2023 FINDINGS: Left-sided ICD is present. The heart is enlarged. There are atherosclerotic calcifications of the aorta. There is no focal lung infiltrate, pleural effusion or pneumothorax. There are degenerative changes of both shoulders. IMPRESSION: 1. No acute cardiopulmonary process. 2. Cardiomegaly. Electronically Signed   By: Greig Pique M.D.   On: 04/06/2024 20:16      IMPRESSION AND PLAN:  Assessment and Plan: * Hypoglycemia - This is likely the culprit for his generalized weakness and fall. -  We will place him in a medical telemetry observation bed. - Will follow neurochecks every 4 hours for 24 hours. - Will obtain a brain MRI without contrast to rule out frontal CVA. - Will monitor blood glucose levels. - PT consult will be obtained.  GERD without esophagitis - Continue PPI therapy.  Essential hypertension - Will continue  antihypertensive therapy.  Atrial fibrillation, chronic (HCC) - Will continue Coreg and Eliquis .   DVT prophylaxis: Eliquis .   Advanced Care Planning:  Code Status: full code. Family Communication:  The plan of care was discussed in details with the patient (and family). I answered all questions. The patient agreed to proceed with the above mentioned plan. Further management will depend upon hospital course. Disposition Plan: Back to previous home environment Consults called: none. All the records are reviewed and case discussed with ED provider.  Status is: Observation  I certify that at the time of admission, it is my clinical judgment that the patient will require  hospital care extending less than 2 midnights.                            Dispo: The patient is from: Home              Anticipated d/c is to: Home              Patient currently is not medically stable to d/c.              Difficult to place patient: No  Madison DELENA Peaches M.D on 04/07/2024 at 7:23 AM  [facility]   From 7 PM-7 AM, contact night-coverage [email or web address]  CC: Primary care physician; Salli Amato, MD

## 2024-04-06 NOTE — ED Notes (Signed)
Urine sample collected and sent to lab at this time.

## 2024-04-06 NOTE — ED Provider Notes (Addendum)
 [facility] Provider Note    Event Date/Time   First MD Initiated Contact with Patient 04/06/24 1946     (approximate)   History   Fall   HPI  Bob Ortiz is a 83 y.o. male who presents to the ED for evaluation of Fall   I review an initial neurology evaluation as an outpatient from 2 months ago.  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  Anticoagulated on Eliquis  In the past year has had progressively worsening hallucinations, memory loss, seizure-like activity.  Consider Lewy body dementia.  Patient presents to the ED with daughter and wife for the evaluation of 24-36 hours of generalized weakness, unsteady gait and a possible fall.  Majority of history is provided over the phone by patient's daughter, Dorthea, who lives at home with him and does majority of caregiving.  Reports that he seemed weak early this morning when she went to work, when she got home from work around 3 PM she found the cushions that she leaves on bedside table askew and worried that he fell and maybe hit his head.  Due to his continued weakness and instability while walking they bring him to the ED.  Patient has no particular complaints   Physical Exam   Triage Vital Signs: ED Triage Vitals  Encounter Vitals Group     BP 04/06/24 1933 106/72     Girls Systolic BP Percentile --      Girls Diastolic BP Percentile --      Boys Systolic BP Percentile --      Boys Diastolic BP Percentile --      Pulse Rate 04/06/24 1933 70     Resp 04/06/24 1933 18     Temp 04/06/24 1933 97.6 F (36.4 C)     Temp Source 04/06/24 1933 Oral     SpO2 04/06/24 1933 99 %     Weight 04/06/24 1935 150 lb 5.7 oz (68.2 kg)     Height --      Head Circumference --      Peak Flow --      Pain Score --      Pain Loc --      Pain Education --      Exclude from Growth Chart --     Most recent vital signs: Vitals:   04/06/24 1933 04/06/24 2200  BP: 106/72 105/73  Pulse: 70 69  Resp: 18 14  Temp:  97.6 F (36.4 C) 97.6 F (36.4 C)  SpO2: 99% 99%    General: Awake, no distress.  CV:  Good peripheral perfusion.  Resp:  Normal effort.  Abd:  No distention.  MSK:  No deformity noted.  Palpation of all 4 extremities without signs of deformity, tenderness or trauma.  No evidence of head injury Neuro:  No focal deficits appreciated. Other:     ED Results / Procedures / Treatments   Labs (all labs ordered are listed, but only abnormal results are displayed) Labs Reviewed  COMPREHENSIVE METABOLIC PANEL WITH GFR - Abnormal; Notable for the following components:      Result Value   Sodium 134 (*)    Glucose, Bld 62 (*)    Creatinine, Ser 0.55 (*)    Calcium 8.2 (*)    Albumin 3.0 (*)    AST 54 (*)    Alkaline Phosphatase 143 (*)    Total Bilirubin 1.3 (*)    All other components within normal limits  CBC WITH  DIFFERENTIAL/PLATELET - Abnormal; Notable for the following components:   Hemoglobin 12.4 (*)    HCT 37.4 (*)    RDW 16.4 (*)    All other components within normal limits  BRAIN NATRIURETIC PEPTIDE - Abnormal; Notable for the following components:   B Natriuretic Peptide 215.8 (*)    All other components within normal limits  URINALYSIS, ROUTINE W REFLEX MICROSCOPIC - Abnormal; Notable for the following components:   Color, Urine YELLOW (*)    APPearance CLEAR (*)    Ketones, ur 20 (*)    All other components within normal limits  CBG MONITORING, ED - Abnormal; Notable for the following components:   Glucose-Capillary 103 (*)    All other components within normal limits  TROPONIN I (HIGH SENSITIVITY) - Abnormal; Notable for the following components:   Troponin I (High Sensitivity) 25 (*)    All other components within normal limits  TROPONIN I (HIGH SENSITIVITY) - Abnormal; Notable for the following components:   Troponin I (High Sensitivity) 28 (*)    All other components within normal limits  RESP PANEL BY RT-PCR (RSV, FLU A&B, COVID)  RVPGX2  MAGNESIUM      EKG A-fib with a rate of 70 bpm, left bundle without STEMI by Sgarbossa criteria  RADIOLOGY CT head interpreted by me without evidence of acute intracranial pathology CT cervical spine interpreted by me without evidence of fracture or dislocation CXR interpreted by me without evidence of acute cardiopulmonary pathology.   Official radiology report(s): CT HEAD WO CONTRAST ( ) Result Date: 04/06/2024 CLINICAL DATA:  falls on AC, eval ich; fall EXAM: CT HEAD WITHOUT CONTRAST CT CERVICAL SPINE WITHOUT CONTRAST TECHNIQUE: Multidetector CT imaging of the head and cervical spine was performed following the standard protocol without intravenous contrast. Multiplanar CT image reconstructions of the cervical spine were also generated. RADIATION DOSE REDUCTION: This exam was performed according to the departmental dose-optimization program which includes automated exposure control, adjustment of the mA and/or kV according to patient size and/or use of iterative reconstruction technique. COMPARISON:  None Available. FINDINGS: CT HEAD FINDINGS Brain: No evidence of large-territorial acute infarction. No parenchymal hemorrhage. No mass lesion. No extra-axial collection. No mass effect or midline shift. No hydrocephalus. Basilar cisterns are patent. Vascular: No hyperdense vessel. Atherosclerotic calcifications are present within the cavernous internal carotid arteries. Skull: No acute fracture or focal lesion. Sinuses/Orbits: Paranasal sinuses and mastoid air cells are clear. The orbits are unremarkable. Other: None. CT CERVICAL SPINE FINDINGS Alignment: Grade 1 anterolisthesis of C2 on C3. Skull base and vertebrae: Multilevel severe degenerative change of the spine. 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 No acute fracture. No aggressive appearing focal osseous lesion or focal pathologic process. Soft tissues and spinal canal: No prevertebral fluid or swelling. No  visible canal hematoma. Upper chest: Biapical paraseptal on sinus changes. Other: None. IMPRESSION: 1. No acute intracranial abnormality. 2. No acute displaced fracture or traumatic listhesis of the cervical spine. 3. 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 Electronically Signed   By: Morgane  Naveau M.D.   On: 04/06/2024 20:59   CT Cervical Spine Wo Contrast Result Date: 04/06/2024 CLINICAL DATA:  falls on AC, eval ich; fall EXAM: CT HEAD WITHOUT CONTRAST CT CERVICAL SPINE WITHOUT CONTRAST TECHNIQUE: Multidetector CT imaging of the head and cervical spine was performed following the standard protocol without intravenous contrast. Multiplanar CT image reconstructions of the cervical spine were also generated. RADIATION DOSE REDUCTION: This  exam was performed according to the departmental dose-optimization program which includes automated exposure control, adjustment of the mA and/or kV according to patient size and/or use of iterative reconstruction technique. COMPARISON:  None Available. FINDINGS: CT HEAD FINDINGS Brain: No evidence of large-territorial acute infarction. No parenchymal hemorrhage. No mass lesion. No extra-axial collection. No mass effect or midline shift. No hydrocephalus. Basilar cisterns are patent. Vascular: No hyperdense vessel. Atherosclerotic calcifications are present within the cavernous internal carotid arteries. Skull: No acute fracture or focal lesion. Sinuses/Orbits: Paranasal sinuses and mastoid air cells are clear. The orbits are unremarkable. Other: None. CT CERVICAL SPINE FINDINGS Alignment: Grade 1 anterolisthesis of C2 on C3. Skull base and vertebrae: Multilevel severe degenerative change of the spine. 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 No acute fracture. No aggressive appearing focal osseous lesion or focal pathologic process. Soft tissues and spinal canal: No prevertebral fluid or  swelling. No visible canal hematoma. Upper chest: Biapical paraseptal on sinus changes. Other: None. IMPRESSION: 1. No acute intracranial abnormality. 2. No acute displaced fracture or traumatic listhesis of the cervical spine. 3. 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 Electronically Signed   By: Morgane  Naveau M.D.   On: 04/06/2024 20:59   DG Chest Portable 1 View Result Date: 04/06/2024 CLINICAL DATA:  Fall EXAM: PORTABLE CHEST 1 VIEW COMPARISON:  Chest x-ray 12/17/2023 FINDINGS: Left-sided ICD is present. The heart is enlarged. There are atherosclerotic calcifications of the aorta. There is no focal lung infiltrate, pleural effusion or pneumothorax. There are degenerative changes of both shoulders. IMPRESSION: 1. No acute cardiopulmonary process. 2. Cardiomegaly. Electronically Signed   By: Greig Pique M.D.   On: 04/06/2024 20:16    PROCEDURES and INTERVENTIONS:  .1-3 Lead EKG Interpretation  Performed by: Claudene Rover, MD Authorized by: Claudene Rover, MD     Interpretation: normal     ECG rate:  74   ECG rate assessment: normal     Rhythm: atrial fibrillation     Ectopy: none     Conduction: normal     Medications  lactated ringers  bolus 500 mL (0 mLs Intravenous Stopped 04/06/24 2308)     IMPRESSION / MDM / ASSESSMENT AND PLAN / ED COURSE  I reviewed the triage vital signs and the nursing notes.  Differential diagnosis includes, but is not limited to, viral syndrome such as COVID-19, ICH, AKI, symptomatic anemia, sepsis, dehydration  {Patient presents with symptoms of an acute illness or injury that is potentially life-threatening.  Patient presents to the ED with 1 day of nonfocal weakness and gait dysfunction, possibly related to poor intake with hypoglycemia and ketonuria.  Nonfocal exam without signs of trauma.  Negative COVID swab, metabolic panel with glucose of 62 but otherwise at baseline, normal CBC.  Minimal elevation of BNP,   and he does not look grossly volume overload or signs of a CHF exacerbation.  Due to his ketonuria provide a small 500 cc IV fluid bolus, due to his hypoglycemia provide p.o. nutrition after this he has significantly improved clinical status.  Signed out to oncoming physician to follow-up on ambulatory trial .  If patient remains unable to ambulate will require admission for PT and glucose monitoring  Clinical Course as of 04/06/24 2337  Thu Apr 06, 2024  2005 I talked to patient's daughter, Dorthea, on the phone in the room on speaker phone but other daughter and wife of the patient.  Weak, wobbly and  not normal for the past 1-2 days.  Fell at some point this morning before 3 PM, send he came home from work and found dried blood in the inside of his lip and pillows in his room askew. [DS]  2150 Reassessed and discussed with wife at the bedside.  Nursing staff providing food to eat and OJ to drink due to hypoglycemia.  Discussed workup so far and plan of care. [DS]  2317 Reassessed, patient sitting upright in bed eating a bag of chips, drinking OJ and looks significantly improved and perked up from before.  Wife at the bedside acknowledges he looks much better and is appreciative.  I talked to [facility] on the phone again and she is also appreciative, we discussed workup overall and plan of care.  She is happy with him coming home tonight as long as he can get up and ambulate at his baseline. [DS]  2332 Unable to ambulate with nursing assistance, will consult medicine for admission [DS]    Clinical Course User Index [DS] Claudene Rover, MD     FINAL CLINICAL IMPRESSION(S) / ED DIAGNOSES   Final diagnoses:  Fall, initial encounter  Hypoglycemia  Ketonuria     Rx / DC Orders   ED Discharge Orders     None        Note:  This document was prepared using Dragon voice recognition software and may include unintentional dictation errors.   Claudene Rover, MD 04/06/24 2322    Claudene Rover,  MD 04/06/24 7665    Claudene Rover, MD 04/06/24 [phone]

## 2024-04-06 NOTE — ED Triage Notes (Addendum)
 Pt arrives via ems from home for unwitnessed fall. Unknown if hit head or Loc, pt on eliquis . Ems reports noted blood in mouth, hx of seizures. 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  Per ems pt fell at 0800 and last fall at 1500.

## 2024-04-06 NOTE — ED Notes (Signed)
 Pt provided w/ orange juice and sandwich

## 2024-04-07 ENCOUNTER — Observation Stay

## 2024-04-07 DIAGNOSIS — R296 Repeated falls: Secondary | ICD-10-CM

## 2024-04-07 DIAGNOSIS — K219 Gastro-esophageal reflux disease without esophagitis: Secondary | ICD-10-CM | POA: Insufficient documentation

## 2024-04-07 LAB — CBC
HCT: 33.2 % — ABNORMAL LOW (ref 39.0–52.0)
Hemoglobin: 11.1 g/dL — ABNORMAL LOW (ref 13.0–17.0)
MCH: 28.7 pg (ref 26.0–34.0)
MCHC: 33.4 g/dL (ref 30.0–36.0)
MCV: 85.8 fL (ref 80.0–100.0)
Platelets: 172 K/uL (ref 150–400)
RBC: 3.87 MIL/uL — ABNORMAL LOW (ref 4.22–5.81)
RDW: 16.3 % — ABNORMAL HIGH (ref 11.5–15.5)
WBC: 5.4 K/uL (ref 4.0–10.5)
nRBC: 0 % (ref 0.0–0.2)

## 2024-04-07 LAB — BASIC METABOLIC PANEL WITH GFR
Anion gap: 6 (ref 5–15)
BUN: 14 mg/dL (ref 8–23)
CO2: 28 mmol/L (ref 22–32)
Calcium: 8.1 mg/dL — ABNORMAL LOW (ref 8.9–10.3)
Chloride: 99 mmol/L (ref 98–111)
Creatinine, Ser: 0.72 mg/dL (ref 0.61–1.24)
GFR, Estimated: >60 mL/min (ref >60)
Glucose, Bld: 85 mg/dL (ref 70–99)
Potassium: 3.8 mmol/L (ref 3.5–5.1)
Sodium: 133 mmol/L — ABNORMAL LOW (ref 135–145)

## 2024-04-07 LAB — GLUCOSE, CAPILLARY: Glucose-Capillary: 134 mg/dL — ABNORMAL HIGH (ref 70–99)

## 2024-04-07 MED ORDER — QUETIAPINE FUMARATE 25 MG PO TABS
25.0000 mg | ORAL_TABLET | Freq: Every day | ORAL | Status: DC
  Administered 2024-04-07: 25 mg via ORAL
  Filled 2024-04-07: qty 1

## 2024-04-07 NOTE — Assessment & Plan Note (Signed)
-   Will continue antihypertensive therapy.

## 2024-04-07 NOTE — Progress Notes (Signed)
  Device system confirmed [employer] [phone] RV Ingevity Plus [id number] LV [id number] [employer]   Device last cleared by EP Provider: Ozell Bernardo [place] 04/07/2024   (Name and date)  Clearance is good through for 1 year as long as parameters remain stable at time of check. If pt undergoes a cardiac device procedure during that time, they should be re-cleared.   Tachy-therapies to be programmed off if applicable with device back to pre-MRI settings after completion of exam.  [employer] was available remotely to assist in programming recommendations.   Bob Ortiz  04/07/2024 7:48 AM

## 2024-04-07 NOTE — Progress Notes (Signed)
 Progress Note   Patient: Bob Ortiz FMW:[id number] DOB: 01/09/1941 DOA: 04/06/2024     0 DOS: the patient was seen and examined on 04/07/2024   Brief hospital course: Bob Ortiz is a 83 y.o. Caucasian male with medical history significant for paroxysmal atrial fibrillation status post pacemaker, nonischemic cardiomyopathy, combined systolic and diastolic CHF, Addison's disease, hypothyroidism and left bundle blanch block. who presented to the emergency room with acute onset of generalized weakness and subsequent fall with inability to ambulate.  Patients daughter helps with history and states that patient fell a day before admission and on the day of admission. She feels he might have had a seizure given he bit his tongue. The patient was found to be hypoglycemic in the ER.  He denied any presyncope or syncope.  No dysuria, oliguria or hematuria or flank pain.  No cough or wheezing or dyspnea.  No chest pain or palpitations.  He does not recall hitting his head.  Per his wife he has lost significant weight over the last several weeks.  He was advised to lose weight at 185 however he went down to 160 in an unintentional way per his wife.   Assessment and Plan: *Falls Unclear etiology of recent falls. Low suspicion of stroke given bilateral nature of symptoms. Hypoglycemia likely contributing. Though symptoms have no improved with normalization of BG. CT of c spine with severe neuroforaminal stenosis.  -PT/OT  -Image knees and hips -Check orthostatic vitals   Weight loss, unintentional  Will need further work up outpatient setting.   GERD without esophagitis - Continue PPI therapy.  Essential hypertension - Will continue antihypertensive therapy.  Atrial fibrillation, chronic (HCC) - Will continue Coreg and Eliquis .  Combined systolic and diastolic HF Appears compensated  Hypothyroidism  TSH WNL 3 mo ago.       Subjective: HOH and difficult to redirect at times. Daughter via phone  and spouse at bedside help provide history.    Physical Exam: Vitals:   04/06/24 2200 04/07/24 0108 04/07/24 0407 04/07/24 0748  BP: 105/73 114/72 123/78 109/71  Pulse: 69 70 71 71  Resp: 14 16 20 18   Temp: 97.6 F (36.4 C)   97.9 F (36.6 C)  TempSrc: Oral     SpO2: 99% 99% 100% 99%  Weight:       Physical Exam  Constitutional: In no distress.  Cardiovascular: Normal rate, regular rhythm. No lower extremity edema  Pulmonary: Non labored breathing on room air, no wheezing or rales.   Abdominal: Soft. Non distended and non tender Musculoskeletal: Normal range of motion.     Neurological: Follows commands intermittently unclear if due to [facility]  Skin: Skin is warm and dry.   Data Reviewed:     Latest Ref Rng & Units 04/07/2024    5:24 AM 04/06/2024    8:14 PM 12/20/2023    3:39 AM  BMP  Glucose 70 - 99 mg/dL 85  62  85   BUN 8 - 23 mg/dL 14  14  16    Creatinine 0.61 - 1.24 mg/dL 9.27  9.44  9.32   Sodium 135 - 145 mmol/L 133  134  127   Potassium 3.5 - 5.1 mmol/L 3.8  4.0  4.1   Chloride 98 - 111 mmol/L 99  100  93   CO2 22 - 32 mmol/L 28  22  23    Calcium 8.9 - 10.3 mg/dL 8.1  8.2  8.4       Latest Ref  Rng & Units 04/07/2024    5:24 AM 04/06/2024    8:14 PM 12/20/2023    3:39 AM  CBC  WBC 4.0 - 10.5 K/uL 5.4  5.2  7.7   Hemoglobin 13.0 - 17.0 g/dL 88.8  87.5  88.6   Hematocrit 39.0 - 52.0 % 33.2  37.4  32.9   Platelets 150 - 400 K/uL 172  174  232      Family Communication: spouse and daughter at bedside.   Disposition: Status is: Observation The patient will require care spanning > 2 midnights and should be moved to inpatient because: Continued work up of his acute weakness.   Planned Discharge Destination: Skilled nursing facility    Time spent: 35 minutes  Author: Alban Pepper, MD 04/07/2024 6:04 PM  For on call review [email or web address].

## 2024-04-07 NOTE — Hospital Course (Addendum)
 Been falling a lot.   Could not stand yesterday.   Maybe seizure. Bit lip and   Seizure after pace maker.    Said back was hurting him.   Fall out of bed.   Yesterday could not stand up. Day before could walk around with no problem.   Tues/wed am and R at 320 falls.    No pressure on the legs.   [name] and hit back of his head. Month ago hit front of forehead.    Weakness and fall in legs. Reason family wanted him to present to the hospital.

## 2024-04-07 NOTE — Assessment & Plan Note (Addendum)
-   This is likely the culprit for his generalized weakness and fall. - We will place him in a medical telemetry observation bed. - Will follow neurochecks every 4 hours for 24 hours. - Will obtain a brain MRI without contrast to rule out frontal CVA. - Will monitor blood glucose levels. - PT consult will be obtained.

## 2024-04-07 NOTE — Assessment & Plan Note (Signed)
 Continue PPI therapy.

## 2024-04-07 NOTE — Care Management Obs Status (Signed)
 MEDICARE OBSERVATION STATUS NOTIFICATION   Patient Details  Name: Bob Ortiz MRN: [id number] Date of Birth: 1941-05-18   Medicare Observation Status Notification Given:  Yes    [name] 04/07/2024, 10:12 AM

## 2024-04-07 NOTE — Assessment & Plan Note (Signed)
-   Will continue Coreg and Eliquis .

## 2024-04-07 NOTE — Evaluation (Signed)
 Physical Therapy Evaluation Patient Details Name: Bob Ortiz MRN: [id number] DOB: April 02, 1941 Today's Date: 04/07/2024  History of Present Illness  OSUALDO HANSELL is a 83 y.o. Caucasian male with medical history significant for paroxysmal atrial fibrillation status post pacemaker, nonischemic cardiomyopathy, combined systolic and diastolic CHF, Addison's disease, hypothyroidism and left bundle blanch block who presented to the emergency room with acute onset of generalized weakness and subsequent fall with inability to ambulate.  Clinical Impression  Pt is a pleasant 83 year old male who was admitted for general weakness and falls. Pt is A&Ox1, and his wife is present to answer questions. Pt performs bed mobility with min A and ambulation with mod A. Prior to hospitalization, pt was able to ambulate without use of AD, however was supervised by family members at all times. Pt's spouse reports that their daughter helps with most care giving. Pt now requires min A x 2 for STS from EOB and demonstrates posterior lean on EOB upon standing. Pt has poor sitting balance requiring mod/max A to maintain balance at EOB. Pt able to take side steps at EOB and demonstrated excessive knee flexion throughout amb and B LE buckling requiring min A x 2 for RW management, sequencing, and to prevent LOB. Pt demonstrates deficits with strength/balance/activity tolerance. Would benefit from skilled PT to address above deficits and promote optimal return to PLOF.          If plan is discharge home, recommend the following: A lot of help with walking and/or transfers;A lot of help with bathing/dressing/bathroom;Direct supervision/assist for medications management;Help with stairs or ramp for entrance;Supervision due to cognitive status   Can travel by private vehicle   No    Equipment Recommendations BSC/3in1  Recommendations for Other Services  OT consult    Functional Status Assessment Patient has had a recent decline  in their functional status and demonstrates the ability to make significant improvements in function in a reasonable and predictable amount of time.     Precautions / Restrictions Precautions Precautions: Fall Recall of Precautions/Restrictions: Impaired Restrictions Weight Bearing Restrictions Per Provider Order: No      Mobility  Bed Mobility Overal bed mobility: Needs Assistance Bed Mobility: Supine to Sit, Sit to Supine     Supine to sit: Min assist Sit to supine: Min assist   General bed mobility comments: Min A for LE management. Pt requires assistance to scoot to EOB. Able to get self out of bed, however requires assistance for boost up higher in bed    Transfers Overall transfer level: Needs assistance Equipment used: Rolling walker (2 wheels) Transfers: Sit to/from Stand Sit to Stand: Min assist, +2 physical assistance           General transfer comment: Min x 2 for STS at EOB. Presents with posterior lean on EOB with stand initially    Ambulation/Gait Ambulation/Gait assistance: Mod assist Gait Distance (Feet): 2 Feet Assistive device: Rolling walker (2 wheels) Gait Pattern/deviations: Step-to pattern, Knee flexed in stance - right, Knee flexed in stance - left, Knees buckling       General Gait Details: Pt unable to safely take steps forward. Side steps taken at EOB. Mod A for RW management and to prevent LOB.  Stairs            Wheelchair Mobility     Tilt Bed    Modified Rankin (Stroke Patients Only)       Balance Overall balance assessment: Needs assistance Sitting-balance support: Feet supported, No  upper extremity supported Sitting balance-Leahy Scale: Poor Sitting balance - Comments: Mod/Max A required for sitting at EOB. Pt very distractable and does not use UEs to try to keep himself up. Posterior lean and L lateral lean intermittently Postural control: Posterior lean, Left lateral lean Standing balance support: Bilateral upper  extremity supported, During functional activity, Reliant on assistive device for balance Standing balance-Leahy Scale: Poor Standing balance comment: Demonstrates excessive knee flexion with standing. B LEs buckling at times.                             Pertinent Vitals/Pain Pain Assessment Pain Assessment: PAINAD Breathing: normal Negative Vocalization: none Facial Expression: smiling or inexpressive Body Language: relaxed Consolability: no need to console PAINAD Score: 0    Home Living Family/patient expects to be discharged to:: Private residence Living Arrangements: Spouse/significant other;Children Available Help at Discharge: Family Type of Home: House Home Access: Stairs to enter Entrance Stairs-Rails: Right Entrance Stairs-Number of Steps: 3 level house, patient and spouse live on the top 2 floors (other family live on the lower level) without option for 1st floor set-up.  6-9 steps to access patient's living quarters. Alternate Level Stairs-Number of Steps: 3 level house, patient and spouse live on the top 2 floors (other family live on the lower level) without option for 1st floor set-up. 6-9 steps to access patient's living quarters. Home Layout: Multi-level Home Equipment: Agricultural consultant (2 wheels);Rollator (4 wheels);Shower seat;Hand held shower head;Grab bars - tub/shower Additional Comments: Pt has RW and rollator but does not use AD for ambulation    Prior Function Prior Level of Function : Needs assist       Physical Assist : Mobility (physical);ADLs (physical) Mobility (physical): Gait;Stairs ADLs (physical): Bathing;Dressing;Toileting Mobility Comments: SBA from family members when patient is mobilizing. Pt's spouse reportrs that he has fallen at least 3 times in the past 6 months ADLs Comments: Pt performs ADLs with supervision from family members.     Extremity/Trunk Assessment   Upper Extremity Assessment Upper Extremity Assessment:  Generalized weakness    Lower Extremity Assessment Lower Extremity Assessment: Generalized weakness    Cervical / Trunk Assessment Cervical / Trunk Assessment: Kyphotic  Communication   Communication Communication: Impaired Factors Affecting Communication: Reduced clarity of speech    Cognition Arousal: Alert Behavior During Therapy: Impulsive   PT - Cognitive impairments: History of cognitive impairments                       PT - Cognition Comments: Pt is pleasant however is A&Ox1. Responds well to short 1 step commands. Pt's wife in room to answer questions for evaluations Following commands: Impaired Following commands impaired: Follows one step commands inconsistently, Follows one step commands with increased time     Cueing Cueing Techniques: Verbal cues, Tactile cues, Visual cues     General Comments      Exercises     Assessment/Plan    PT Assessment Patient needs continued PT services  PT Problem List Decreased strength;Decreased activity tolerance;Decreased balance;Decreased mobility;Decreased safety awareness;Decreased knowledge of use of DME       PT Treatment Interventions Gait training;Stair training;Functional mobility training;Therapeutic activities;Therapeutic exercise;Balance training;Neuromuscular re-education;Patient/family education    PT Goals (Current goals can be found in the Care Plan section)  Acute Rehab PT Goals Patient Stated Goal: Pt unable to participate in goal setting d/t cognition PT Goal Formulation: Patient unable to participate in goal  setting Time For Goal Achievement: 04/21/24 Potential to Achieve Goals: Fair    Frequency Min 2X/week     Co-evaluation               AM-PAC PT 6 Clicks Mobility  Outcome Measure Help needed turning from your back to your side while in a flat bed without using bedrails?: A Little Help needed moving from lying on your back to sitting on the side of a flat bed without using  bedrails?: A Little Help needed moving to and from a bed to a chair (including a wheelchair)?: A Lot Help needed standing up from a chair using your arms (e.g., wheelchair or bedside chair)?: A Little Help needed to walk in hospital room?: A Lot Help needed climbing 3-5 steps with a railing? : A Lot 6 Click Score: 15    End of Session Equipment Utilized During Treatment: Gait belt Activity Tolerance: Patient tolerated treatment well Patient left: in bed;with call bell/phone within reach;with bed alarm set;with family/visitor present Nurse Communication: Mobility status;Other (comment) (IV status) PT Visit Diagnosis: Unsteadiness on feet (R26.81);Muscle weakness (generalized) (M62.81);History of falling (Z91.81);Repeated falls (R29.6);Difficulty in walking, not elsewhere classified (R26.2)    Time: 8680-8659 PT Time Calculation (min) (ACUTE ONLY): 21 min   Charges:                 Jasslyn Finkel, SPT   Rease Wence 04/07/2024, 2:16 PM

## 2024-04-08 ENCOUNTER — Observation Stay

## 2024-04-08 DIAGNOSIS — R296 Repeated falls: Secondary | ICD-10-CM | POA: Diagnosis not present

## 2024-04-08 LAB — CBC
HCT: 30.9 % — ABNORMAL LOW (ref 39.0–52.0)
Hemoglobin: 10.6 g/dL — ABNORMAL LOW (ref 13.0–17.0)
MCH: 28.9 pg (ref 26.0–34.0)
MCHC: 34.3 g/dL (ref 30.0–36.0)
MCV: 84.2 fL (ref 80.0–100.0)
Platelets: 157 K/uL (ref 150–400)
RBC: 3.67 MIL/uL — ABNORMAL LOW (ref 4.22–5.81)
RDW: 16.4 % — ABNORMAL HIGH (ref 11.5–15.5)
WBC: 5.6 K/uL (ref 4.0–10.5)
nRBC: 0 % (ref 0.0–0.2)

## 2024-04-08 LAB — IRON AND TIBC
Iron: 42 ug/dL — ABNORMAL LOW (ref 45–182)
Saturation Ratios: 20 % (ref 17.9–39.5)
TIBC: 210 ug/dL — ABNORMAL LOW (ref 250–450)
UIBC: 168 ug/dL

## 2024-04-08 LAB — BASIC METABOLIC PANEL WITH GFR
Anion gap: 5 (ref 5–15)
BUN: 12 mg/dL (ref 8–23)
CO2: 25 mmol/L (ref 22–32)
Calcium: 7.9 mg/dL — ABNORMAL LOW (ref 8.9–10.3)
Chloride: 96 mmol/L — ABNORMAL LOW (ref 98–111)
Creatinine, Ser: 0.71 mg/dL (ref 0.61–1.24)
GFR, Estimated: >60 mL/min (ref >60)
Glucose, Bld: 73 mg/dL (ref 70–99)
Potassium: 3.9 mmol/L (ref 3.5–5.1)
Sodium: 128 mmol/L — ABNORMAL LOW (ref 135–145)

## 2024-04-08 LAB — VITAMIN D 25 HYDROXY (VIT D DEFICIENCY, FRACTURES): Vit D, 25-Hydroxy: 59.32 ng/mL (ref 30–100)

## 2024-04-08 MED ORDER — CARVEDILOL 6.25 MG PO TABS
3.1250 mg | ORAL_TABLET | Freq: Two times a day (BID) | ORAL | Status: DC
  Administered 2024-04-09 – 2024-04-17 (×15): 3.125 mg via ORAL
  Filled 2024-04-08 (×16): qty 1

## 2024-04-08 MED ORDER — QUETIAPINE FUMARATE 25 MG PO TABS
12.5000 mg | ORAL_TABLET | Freq: Every day | ORAL | Status: DC
  Administered 2024-04-08 – 2024-04-16 (×9): 12.5 mg via ORAL
  Filled 2024-04-08 (×9): qty 1

## 2024-04-08 NOTE — Plan of Care (Signed)

## 2024-04-08 NOTE — Progress Notes (Signed)
 Progress Note   Patient: Bob Ortiz FMW:[id number] DOB: 05-02-1941 DOA: 04/06/2024     0 DOS: the patient was seen and examined on 04/08/2024   Brief hospital course: Bob Ortiz is a 83 y.o. Caucasian male with medical history significant for paroxysmal atrial fibrillation status post pacemaker, nonischemic cardiomyopathy, combined systolic and diastolic CHF, Addison's disease, hypothyroidism and left bundle blanch block. who presented to the emergency room with acute onset of generalized weakness and subsequent fall with inability to ambulate.  Patients daughter helps with history and states that patient fell a day before admission and on the day of admission. She feels he might have had a seizure given he bit his tongue. The patient was found to be hypoglycemic in the ER.  He denied any presyncope or syncope.  No dysuria, oliguria or hematuria or flank pain.  No cough or wheezing or dyspnea.  No chest pain or palpitations.  He does not recall hitting his head.  Per his wife he has lost significant weight over the last several weeks.  He was advised to lose weight at 185 however he went down to 160 in an unintentional way per his wife.   Assessment and Plan: *Falls Unclear etiology of recent falls. Low suspicion of stroke given bilateral nature of symptoms. Hypoglycemia possibly contributing though not clear if true hypoglycemia given BG in 60s and patient not on insulin. 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 Per neurology note patient with bradykinesia and lewy body dementia. -PT/OT   LB dementia Recently diagnosed. Referred to movement clinic at [facility].  Discussed with patient family he may not return to baseline.   Weight loss, unintentional  Will need further work up outpatient setting. Recent CT chest no mass. MRI brain no mass.   GERD without esophagitis - Continue PPI therapy.  Hyponatremia  Mild. Maybe volume down.  CTM, continue to encourage PO intake.   Essential  hypertension On coreg  only.   Atrial fibrillation, chronic (HCC) - Will continue Coreg  and Eliquis . NSR  Combined systolic and diastolic HF Appears compensated. Hold home jardiance, and torsemide May be volume down.   Hypothyroidism  TSH WNL 3 mo ago.       Subjective: Does not respond appropriately  Physical Exam: Vitals:   04/07/24 2049 04/08/24 0344 04/08/24 0805 04/08/24 1530  BP: (!) 114/59 101/79 106/64 130/76  Pulse: 85 71 70 76  Resp: 16 16 19 18   Temp: 98.3 F (36.8 C) 97.6 F (36.4 C) 97.7 F (36.5 C) 98.8 F (37.1 C)  TempSrc:  Oral    SpO2: 98% 99% 100% 99%  Weight:       Physical Exam  Constitutional: In no distress.  Cardiovascular: Normal rate, regular rhythm. No lower extremity edema  Pulmonary: Non labored breathing on room air, no wheezing or rales.   Abdominal: Soft. Non distended Neurological: Not following commands. Spontaneously moves extremities  Skin: Skin is warm and dry.   Data Reviewed:     Latest Ref Rng & Units 04/08/2024    4:51 AM 04/07/2024    5:24 AM 04/06/2024    8:14 PM  BMP  Glucose 70 - 99 mg/dL 73  85  62   BUN 8 - 23 mg/dL 12  14  14    Creatinine 0.61 - 1.24 mg/dL 9.28  9.27  9.44   Sodium 135 - 145 mmol/L 128  133  134   Potassium 3.5 - 5.1 mmol/L 3.9  3.8  4.0  Chloride 98 - 111 mmol/L 96  99  100   CO2 22 - 32 mmol/L 25  28  22    Calcium 8.9 - 10.3 mg/dL 7.9  8.1  8.2       Latest Ref Rng & Units 04/08/2024    4:51 AM 04/07/2024    5:24 AM 04/06/2024    8:14 PM  CBC  WBC 4.0 - 10.5 K/uL 5.6  5.4  5.2   Hemoglobin 13.0 - 17.0 g/dL 89.3  88.8  87.5   Hematocrit 39.0 - 52.0 % 30.9  33.2  37.4   Platelets 150 - 400 K/uL 157  172  174      Family Communication: spouse and daughter at bedside.   Disposition: Status is: Observation The patient will require care spanning > 2 midnights and should be moved to inpatient because: Continued work up of his acute weakness.   Planned Discharge Destination: Skilled nursing  facility    Time spent: 35 minutes  Author: Alban Pepper, MD 04/08/2024 6:43 PM  For on call review [email or web address].

## 2024-04-08 NOTE — Evaluation (Signed)
 Occupational Therapy Evaluation Patient Details Name: Bob Ortiz MRN: [id number] DOB: 1940/08/16 Today's Date: 04/08/2024   History of Present Illness   OTT ZIMMERLE is a 83 y.o. Caucasian male with medical history significant for paroxysmal atrial fibrillation status post pacemaker, nonischemic cardiomyopathy, combined systolic and diastolic CHF, Addison's disease, hypothyroidism and left bundle blanch block who presented to the emergency room with acute onset of generalized weakness and subsequent fall with inability to ambulate.     Clinical Impressions Mr. Colebank is oriented to self only this day, is largely incommunicado, generally unable to follow directions. Wife is present in room and provides details of pt's PLOF and living situation, stating that until recently he has been ambulating without AD, has been able to go up and down stairs (his bedroom is on the 2nd story of their house), has been dressing self, showering w/ SUPV. She does report that he has had a number of falls in recent weeks. Pt is far from his baseline level of fxl mobility at present. Based on documentation from physical therapy evaluation yesterday, pt is demonstrating reduced level of engagement and mobility today, compared to yesterday. Recommend ongoing OT while hospitalized. At current level of alertness and mobility, pt cannot go home safely: recommend daily rehab, <3 hrs/day. Pt's spouse expresses interest in palliative care consult, as she recognizes that pt's condition has changed significantly.      If plan is discharge home, recommend the following:   A lot of help with walking and/or transfers;A lot of help with bathing/dressing/bathroom;Assistance with feeding;Assist for transportation;Direct supervision/assist for medications management;Help with stairs or ramp for entrance;Assistance with cooking/housework;Direct supervision/assist for financial management;Supervision due to cognitive status      Functional Status Assessment   Patient has had a recent decline in their functional status and demonstrates the ability to make significant improvements in function in a reasonable and predictable amount of time.     Equipment Recommendations   None recommended by OT     Recommendations for Other Services   Other (comment) (palliative care consult; podiatry consult)     Precautions/Restrictions   Precautions Precautions: Fall Recall of Precautions/Restrictions: Impaired Restrictions Weight Bearing Restrictions Per Provider Order: No     Mobility Bed Mobility Overal bed mobility: Needs Assistance Bed Mobility: Rolling Rolling: Mod assist         General bed mobility comments: Pt is unable to transfer supine<sit    Transfers Overall transfer level: Needs assistance                 General transfer comment: Pt unable this date      Balance     Sitting balance-Leahy Scale: Poor Sitting balance - Comments: Pt unable to achieve full sitting balance EOB     Standing balance-Leahy Scale: Zero                             ADL either performed or assessed with clinical judgement   ADL Overall ADL's : Needs assistance/impaired                                       General ADL Comments: At present, anticipate that pt would require Max A for all ADLs, including feeding.     Vision         Perception  Praxis         Pertinent Vitals/Pain Pain Assessment Breathing: normal Negative Vocalization: none Facial Expression: smiling or inexpressive Body Language: tense, distressed pacing, fidgeting Consolability: no need to console PAINAD Score: 1     Extremity/Trunk Assessment Upper Extremity Assessment Upper Extremity Assessment: Generalized weakness   Lower Extremity Assessment Lower Extremity Assessment: Generalized weakness       Communication Communication Communication: Impaired Factors  Affecting Communication: Reduced clarity of speech;Difficulty expressing self   Cognition Arousal: Obtunded Behavior During Therapy: Flat affect               OT - Cognition Comments: Oriented to self only                 Following commands: Impaired Following commands impaired: Follows one step commands inconsistently     Cueing  General Comments          Exercises Other Exercises Other Exercises: Educ w/ wife re: PoC, DC recs   Shoulder Instructions      Home Living Family/patient expects to be discharged to:: Private residence Living Arrangements: Spouse/significant other;Children Available Help at Discharge: Family Type of Home: House Home Access: Stairs to enter [employer] of Steps: 3 level house, patient and spouse live on the top 2 floors (other family live on the lower level) without option for 1st floor set-up.  6-9 steps to access patient's living quarters. Entrance Stairs-Rails: Right Home Layout: Multi-level Alternate Level Stairs-Number of Steps: 3 level house, patient and spouse live on the top 2 floors (other family live on the lower level) without option for 1st floor set-up. 6-9 steps to access patient's living quarters.   Bathroom Shower/Tub: Producer, television/film/video: Standard     Home Equipment: Agricultural consultant (2 wheels);Rollator (4 wheels);Shower seat;Hand held shower head;Grab bars - tub/shower   Additional Comments: Pt has RW and rollator but does not use AD for ambulation      Prior Functioning/Environment Prior Level of Function : Needs assist             Mobility Comments: SBA from family members when patient is mobilizing. Pt's spouse reportrs that he has fallen at least 3 times in the past 6 months ADLs Comments: Pt performs ADLs with supervision from family members.    OT Problem List: Decreased strength;Decreased range of motion;Decreased activity tolerance;Impaired balance (sitting and/or  standing);Decreased cognition   OT Treatment/Interventions: Self-care/ADL training;Patient/family education;Therapeutic exercise;Balance training;Therapeutic activities;Cognitive remediation/compensation;DME and/or AE instruction      OT Goals(Current goals can be found in the care plan section)   Acute Rehab OT Goals Patient Stated Goal: Pt is unable to state a goal this date OT Goal Formulation: With patient Time For Goal Achievement: 04/22/24 Potential to Achieve Goals: Good ADL Goals Pt Will Perform Eating: with supervision;with set-up;sitting Pt Will Perform Grooming: with set-up;with contact guard assist;sitting Pt Will Perform Upper Body Dressing: sitting;with min assist Pt Will Transfer to Toilet: with min assist;ambulating;stand pivot transfer   OT Frequency:  Min 1X/week    Co-evaluation              AM-PAC OT 6 Clicks Daily Activity     Outcome Measure Help from another person eating meals?: A Lot Help from another person taking care of personal grooming?: A Lot Help from another person toileting, which includes using toliet, bedpan, or urinal?: A Lot Help from another person bathing (including washing, rinsing, drying)?: A Lot Help from another person to put on  and taking off regular upper body clothing?: A Lot Help from another person to put on and taking off regular lower body clothing?: A Lot 6 Click Score: 12   End of Session    Activity Tolerance: Patient limited by lethargy Patient left: in bed;with family/visitor present;with call bell/phone within reach;with bed alarm set  OT Visit Diagnosis: Unsteadiness on feet (R26.81);Other abnormalities of gait and mobility (R26.89);Repeated falls (R29.6);Muscle weakness (generalized) (M62.81);Feeding difficulties (R63.3);Other symptoms and signs involving cognitive function                Time: 8977-8957 OT Time Calculation (min): 20 min Charges:  OT General Charges $OT Visit: 1 Visit OT Evaluation $OT  Eval Low Complexity: 1 Low Suzen Hock, PhD, MS, OTR/L 04/08/24, 1:06 PM

## 2024-04-09 ENCOUNTER — Observation Stay

## 2024-04-09 DIAGNOSIS — Z7189 Other specified counseling: Secondary | ICD-10-CM | POA: Diagnosis not present

## 2024-04-09 DIAGNOSIS — R824 Acetonuria: Secondary | ICD-10-CM

## 2024-04-09 DIAGNOSIS — Z66 Do not resuscitate: Secondary | ICD-10-CM

## 2024-04-09 DIAGNOSIS — W19XXXA Unspecified fall, initial encounter: Secondary | ICD-10-CM | POA: Diagnosis not present

## 2024-04-09 DIAGNOSIS — E2740 Unspecified adrenocortical insufficiency: Secondary | ICD-10-CM | POA: Diagnosis not present

## 2024-04-09 DIAGNOSIS — Z515 Encounter for palliative care: Secondary | ICD-10-CM

## 2024-04-09 DIAGNOSIS — Z789 Other specified health status: Secondary | ICD-10-CM | POA: Diagnosis not present

## 2024-04-09 LAB — FOLATE: Folate: 11.2 ng/mL (ref >5.9)

## 2024-04-09 LAB — CBC
HCT: 33.5 % — ABNORMAL LOW (ref 39.0–52.0)
Hemoglobin: 11.5 g/dL — ABNORMAL LOW (ref 13.0–17.0)
MCH: 28.7 pg (ref 26.0–34.0)
MCHC: 34.3 g/dL (ref 30.0–36.0)
MCV: 83.5 fL (ref 80.0–100.0)
Platelets: 190 K/uL (ref 150–400)
RBC: 4.01 MIL/uL — ABNORMAL LOW (ref 4.22–5.81)
RDW: 16.7 % — ABNORMAL HIGH (ref 11.5–15.5)
WBC: 5.3 K/uL (ref 4.0–10.5)
nRBC: 0 % (ref 0.0–0.2)

## 2024-04-09 LAB — BASIC METABOLIC PANEL WITH GFR
Anion gap: 8 (ref 5–15)
BUN: 15 mg/dL (ref 8–23)
CO2: 24 mmol/L (ref 22–32)
Calcium: 8.2 mg/dL — ABNORMAL LOW (ref 8.9–10.3)
Chloride: 96 mmol/L — ABNORMAL LOW (ref 98–111)
Creatinine, Ser: 0.78 mg/dL (ref 0.61–1.24)
GFR, Estimated: >60 mL/min (ref >60)
Glucose, Bld: 74 mg/dL (ref 70–99)
Potassium: 4.1 mmol/L (ref 3.5–5.1)
Sodium: 128 mmol/L — ABNORMAL LOW (ref 135–145)

## 2024-04-09 LAB — MAGNESIUM: Magnesium: 2 mg/dL (ref 1.7–2.4)

## 2024-04-09 LAB — CORTISOL-AM, BLOOD: Cortisol - AM: 5.8 ug/dL — ABNORMAL LOW (ref 6.7–22.6)

## 2024-04-09 NOTE — Consult Note (Signed)
 Consultation Note Date: 04/09/2024 at 1100  Patient Name: Bob Ortiz  DOB: [birth year]  MRN: [id number]  Age / Sex: 83 y.o., male  PCP: Salli Amato, MD Referring Physician: Franchot Novel, MD  HPI/Patient Profile: 83 y.o. male  with past medical history significant for paroxysmal Afib s/p pacemaker, NICM, combined systolic and diastolic CHF, Addison's disease, hypothyroidism and LBBB. Patient presented to ED 04/06/2024 from home via EMS c/o generalized weakness, falls and inability to ambulate. On arrival to ED, pt was unable to provide history. Daughter and wife report patient had fall the day prior with concern for seizure since he bit his tongue. Pt was found to be hypoglycemic. Family also share patient has experienced significant weight loss over the past several weeks. Per chart, he was being evaluated by neurology at [facility] for bradykinesia and Lewy Body dementia.   ED workup found Na+ 134, glucose 62-->85, creatinine 0.55, calcium 8.2, alk phos 143, albumin 3.0, AST 54, t bili 1.3, BNP 215.8, and troponin 25-->28. WBC 5.2, Hgb 12.4. CT head revealed no acute intracranial abnormalities. 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  ED vitals 106/72, HR 70, RR 18, SpO2 99% and 97.6  TRH was consulted for admission and management of hypoglycemia, falls and weight loss.   Palliative was consulted for assistance with goals of conversations.   Clinical Assessment and Goals of Care: Extensive chart review completed prior to meeting patient including labs, vital signs, imaging, progress notes, orders, and available advanced directive documents from current and previous encounters. I then met with patient, Clarita (wife), [name] (daughter) and Dorthea (daughter) via phone, to discuss diagnosis prognosis, GOC, EOL wishes, disposition and  options.     Latest Ref Rng & Units 04/09/2024    8:53 AM 04/08/2024    4:51 AM 04/07/2024    5:24 AM  CBC  WBC 4.0 - 10.5 K/uL 5.3  5.6  5.4   Hemoglobin 13.0 - 17.0 g/dL 88.4  89.3  88.8   Hematocrit 39.0 - 52.0 % 33.5  30.9  33.2   Platelets 150 - 400 K/uL 190  157  172       Latest Ref Rng & Units 04/09/2024    8:53 AM 04/08/2024    4:51 AM 04/07/2024    5:24 AM  CMP  Glucose 70 - 99 mg/dL 74  73  85   BUN 8 - 23 mg/dL 15  12  14    Creatinine 0.61 - 1.24 mg/dL 9.21  9.28  9.27   Sodium 135 - 145 mmol/L 128  128  133   Potassium 3.5 - 5.1 mmol/L 4.1  3.9  3.8   Chloride 98 - 111 mmol/L 96  96  99   CO2 22 - 32 mmol/L 24  25  28    Calcium 8.9 - 10.3 mg/dL 8.2  7.9  8.1      I introduced Palliative Medicine as specialized medical care for people living with serious illness.  It focuses on providing relief from the symptoms and stress of a serious illness. The goal is to improve quality of life for both the patient and the family.  Ill-appearing, elderly male lying in bed with family at bedside. He is alert but only able to tell me he lives on [address] in [place]. He incorrectly states year as [birth year]. He is not able to identify wife or daughter. During visit, patient noted to hallucinating and grabbing at something in air. He does reply no' to question of pain. He sleeps for most of visit. He is in no distress.   We discussed a brief life review of the patient. Mr. Fairley and his wife, Clarita, have been married for 48 years. They have 2 children, Dorthea and [name], 3 grandchildren and 1 great-grandchild. Mr. 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   As far as functional and nutritional status family shares that he was walking most recently in the home with assistance. In the past few weeks he has developed sleep disturbances, aggressive behavior and refusing to eat. In the past few days, he has lost the  ability to walk. Patient was being evaluated by [facility] for symptoms consistent with bradykinesia and Lewy Body dementia.   We discussed patient's current illness and what it means in the larger context of patient's on-going co-morbidities.  Natural disease trajectory and expectations at EOL were discussed. Family understands that Mr. Zaun has had a sudden decline in function and Lewy body is a progressive disease that cannot be cured.   I attempted to elicit values and goals of care important to the patient. Dorthea identifies short-term goal as walking and ability to perform some ADLs independently with long-term goal of going home.   The difference between aggressive medical intervention and comfort care was considered in light of the patient's goals of care.   Advance directives, concepts specific to code status, artificial feeding and hydration, and rehospitalization were considered and discussed. After much discussion, family agrees to honor his advanced care planning wishes for a natural death with no life prolonging measures and his dementia diagnosis and change CODE STATUS to DNR/DNI.   DNR signed and uploaded to chart.  Original placed in physical chart.   Education offered regarding concept specific to human mortality and the limitations of medical interventions to prolong life when the body begins to fail to thrive.  Family is facing treatment option decisions, advanced directive, and anticipatory care needs. Patients wife request that her daughters Dorthea and [name] be Runner, broadcasting/film/video for her husband.   Discussed with patient/family the importance of continued conversation with family and the medical providers regarding overall plan of care and treatment options, ensuring decisions are within the context of the patient's values and GOCs.    Hospice and Palliative Care services outpatient were explained and offered. At this time, family requests to investigate having  patient placed in facility with hospice following. They share that patient's level of care is too much for them and feel he would receive better care in a facility.   Contacted TOC and [facility] who are actively engaged in discharge disposition planning.   Questions and concerns were addressed. The family was encouraged to call with questions or concerns.   Primary Decision Maker NEXT OF [place] Dorthea, daughter and [name], daughter Wife has deferred decision making to her daughters   Physical Exam Vitals  and nursing note reviewed.  Constitutional:      General: He is not in acute distress.    Appearance: He is ill-appearing.  HENT:     Head: Normocephalic and atraumatic.     Mouth/Throat:     Mouth: Mucous membranes are dry.  Pulmonary:     Effort: Pulmonary effort is normal. No respiratory distress.  Skin:    General: Skin is warm and dry.  Neurological:     Mental Status: He is alert. He is disoriented.     Motor: Weakness present.    Recommendations/Plan: DNR/DNI- Uploaded to Epic via Vynca, signed DNR placed in paper chart Continue current supportive interventions Plan to d/c home with hospice when medically stable  Palliative Assessment/Data: 20%   Discussed plan of care with Dr. Franchot, [facility] and [facility] liaison.   Thank you for this consult. Palliative medicine will continue to follow and assist holistically.   Time Total: 105 minutes  Time spent includes: Detailed review of medical records (labs, imaging, vital signs), medically appropriate exam (mental status, respiratory, cardiac, skin), discussed with treatment team, counseling and educating patient, family and staff, documenting clinical information, medication management and coordination of care.     Devere Sacks, AMANDA [facility] Palliative Medicine Team  04/09/2024 8:50 AM  Office [phone]  Pager [phone]     Please contact Palliative Medicine Team providers via AMION for questions and concerns.

## 2024-04-09 NOTE — Plan of Care (Signed)

## 2024-04-09 NOTE — Progress Notes (Incomplete)
 Progress Note   Patient: Bob Ortiz FMW:[id number] DOB: [birth year] DOA: 04/06/2024     0 DOS: the patient was seen and examined on 04/09/2024   Brief hospital course: Bob Ortiz is a 83 y.o. Caucasian male with medical history significant for paroxysmal atrial fibrillation status post pacemaker, nonischemic cardiomyopathy, combined systolic and diastolic CHF, Addison's disease, hypothyroidism and left bundle blanch block. who presented to the emergency room with acute onset of generalized weakness and subsequent fall with inability to ambulate.  Patients daughter helps with history and states that patient fell a day before admission and on the day of admission. She feels he might have had a seizure given he bit his tongue. The patient was found to have low blood glucose in the ER.  He denied any presyncope or syncope.  No dysuria, oliguria or hematuria or flank pain.  No cough or wheezing or dyspnea.  No chest pain or palpitations.  He does not recall hitting his head.  Per his wife he has lost significant weight over the last several weeks.  He was advised to lose weight at 185 however he went down to 160 in an unintentional way per his wife.   Assessment and Plan:  Falls AMS Likely multifactorial. After further review of EMR. Suspect acute worsening of mentation, weakness, anorexia and weight loss is a result of secondary adrenal insufficiency. Patient presented with BG in the 60s and hyponatremia. His AM cortisol (Drawn 0830) 5.8 micrograms/dL. 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 He was eventually transitioned to prednisone . Per patient's daughter she is not sure patient has been regularly taking his medications appropriately has he did not ask for assistance until 08/2023.   Patient has also followed with neurology and is thought to have lewy body dementia which is also likely contributing.   Plan:  Start  hydrocortisone  will reduced initial dose  Patient labs not as severe as initial presentation in 2019 though symptoms are worse but this is possibly due to underlying dementia. In addition, patient had hallucinations in 2019.    LB dementia Recently diagnosed. Referred to movement clinic at [facility].  Discussed with patient family he may not return to baseline.   Weight loss, unintentional  Family does note that patient has been eating and drinking less.  Recent CT chest and MRI brain no evidence of mass or LN to suggest malignancy. Possibly related to LB dementia v adrenal insufficiency.   GERD without esophagitis - Continue PPI therapy.  Hyponatremia  Mild. Possibly in setting of above. Does not appear volume overloaded.  CTM, continue to encourage PO intake.   Essential hypertension On coreg  only.   Atrial fibrillation, chronic (HCC) - Will continue Coreg  and Eliquis . NSR  Combined systolic and diastolic HF Appears compensated. Hold home jardiance, and torsemide May be volume down.   ?Hypothyroidism  TSH WNL 3 mo ago. Diagnosis is unclear. Was acutely ill when initially diagnosed. Not currently on levothyroxine.  Repeat TSH and FT4,   Normocytic anemia  Mild. Stable.     Latest Ref Rng & Units 04/09/2024    8:53 AM 04/08/2024    4:51 AM 04/07/2024    5:24 AM  CBC  WBC 4.0 - 10.5 K/uL 5.3  5.6  5.4   Hemoglobin 13.0 - 17.0 g/dL 88.4  89.3  88.8   Hematocrit 39.0 - 52.0 % 33.5  30.9  33.2  Platelets 150 - 400 K/uL 190  157  172    B12 low ~7 mo. Ago. Folate WNL. Iron panel WNL.  -Continue b12 supplementation.      Subjective: Intermittently responds to questions and follows commands. Says name and knows birthday.   Physical Exam: Vitals:   04/08/24 1530 04/08/24 2029 04/09/24 0349 04/09/24 0904  BP: 130/76 (!) 137/100 114/70 117/68  Pulse: 76 85 79 71  Resp: 18 19 19  (!) 21  Temp: 98.8 F (37.1 C) 98.5 F (36.9 C) 99 F (37.2 C)   TempSrc:      SpO2: 99% 98% 97%  97%  Weight:         Constitutional: In no distress. Intermittently follows commands.  Cardiovascular: Normal rate, regular rhythm. No lower extremity edema  Pulmonary: Non labored breathing on room air, no wheezing or rales.   Abdominal: Soft. Non distended and non tender Musculoskeletal: Normal range of motion.     Neurological: Alert and oriented to person only. Spontaneously moves extremities. Not able to fully assess given he does not participate well in exam.  Skin: Skin is warm and dry.    Data Reviewed:     Latest Ref Rng & Units 04/09/2024    8:53 AM 04/08/2024    4:51 AM 04/07/2024    5:24 AM  BMP  Glucose 70 - 99 mg/dL 74  73  85   BUN 8 - 23 mg/dL 15  12  14    Creatinine 0.61 - 1.24 mg/dL 9.21  9.28  9.27   Sodium 135 - 145 mmol/L 128  128  133   Potassium 3.5 - 5.1 mmol/L 4.1  3.9  3.8   Chloride 98 - 111 mmol/L 96  96  99   CO2 22 - 32 mmol/L 24  25  28    Calcium 8.9 - 10.3 mg/dL 8.2  7.9  8.1       Latest Ref Rng & Units 04/09/2024    8:53 AM 04/08/2024    4:51 AM 04/07/2024    5:24 AM  CBC  WBC 4.0 - 10.5 K/uL 5.3  5.6  5.4   Hemoglobin 13.0 - 17.0 g/dL 88.4  89.3  88.8   Hematocrit 39.0 - 52.0 % 33.5  30.9  33.2   Platelets 150 - 400 K/uL 190  157  172      Family Communication: spouse and daughter at bedside.   Disposition: Status is: Observation The patient will require care spanning > 2 midnights and should be moved to inpatient because: Continued work up of his acute weakness.   Planned Discharge Destination: Skilled nursing facility    Time spent: 35 minutes  Author: Alban Pepper, MD 04/09/2024 6:56 PM  For on call review [email or web address].

## 2024-04-09 NOTE — Progress Notes (Signed)
 [facility] Liaison Note  Follow up meeting with daughter.  Daughter would like to take her father home with hospice services now.    Patient needs the following equipment in the home prior to discharge: DME Needs-  hospital bed, 4 rails, over bed table, BSC, hoyer lift,  DME in the home-  rollator, 2 wheeled  Dorthea will need a day to get things moved out of her home to accommodate DME- since he will be on a level that does not currently supported with bedroom or bathroom.  (Split level home).    Hospice referral sent to Referral Specialist.  Hospital liaison team will continue to follow through final disposition.  Saddie HILARIO Na, RN Nurse Liaison [phone]

## 2024-04-09 NOTE — TOC Initial Note (Addendum)
 Transition of Care [facility]) - Initial/Assessment Note    Patient Details  Name: Bob Ortiz MRN: [id number] Date of Birth: [birth year]  Transition of [facility]) CM/SW Contact:    [place] L An[place]lee Tercero, LCSW Phone Number: 04/09/2024, 1:37 PM  Clinical Narrative:                  CSW received a secure chat advising that patients family wanted a consult with [facility] to discuss hospice services. CSW contacted patients daughter, [name].   [name] confirmed her interest in hospice and confirmed that they wanted [facility] to consult. [name] advised that she and her mother are unable to manage patients needs at home. She stated that she would consider LTC placement. CSW advised daughter that STR placements are located while inpatient. CSW advised that a discharge plan is needed until a bed can be located for LTC. Patient can not remain admitted to locate a LTC placement. CSW advised that the family would have to call facilities to determine if LTC placement is available. [name] advised that she and her mother would call tomorrow.   CSW notified Saddie Na that family is ready to proceed.   3:15pm: CSW spoke with daughter, [name]. There is some confusion regarding what the family need. CSW explained that patient can not remain inpatient until a LTC bed is found. CSW advised that a discharge plan needs to be developed that will make the discharge safe. CSW expressed an understanding that the spouse can not care for husband alone. CSW and [name] discussed options for Home Health, [facility] etc. DME equipment such as a hoyer lift discussed. CSW also advised that family may want to consider private pay options. CSW offered to complete an FL2 for memory care which [name] accepted but advised that she needed to discuss this with her mother.   CSW reiterated that patient can not remain inpatient until a LTC bed is located. [name] asked to speak with Saddie Na again.   CSW advised that per chart  review, patient does not seem to have needs that would improve with rehab however, PT and OT recommend SNF placement. Patient would not qualify for ALF as patient needs more care. Patient may qualify for memory care.   TOC will continue to have this discussion with the family.      5:22pm-CSW spoke with patient daughter and offered additional resources. CSW provided resources for Life Alert and the CAP/DA program with [facility] DSS. CSW also provided information for [facility] medical alert [phone].      Patient Goals and CMS Choice            Expected Discharge Plan and Services                                              Prior Living Arrangements/Services                       Activities of Daily Living   ADL Screening (condition at time of admission) Independently performs ADLs?: Yes (appropriate for developmental age) Is the patient deaf or have difficulty hearing?: No Does the patient have difficulty seeing, even when wearing glasses/contacts?: No Does the patient have difficulty concentrating, remembering, or making decisions?: Yes  Permission Sought/Granted  Emotional Assessment              Admission diagnosis:  Ketonuria [R82.4] Hypoglycemia [E16.2] Fall, initial encounter [W19.XXXA] Patient Active Problem List   Diagnosis Date Noted   GERD without esophagitis 04/07/2024   Hypoglycemia 04/06/2024   Acute on chronic congestive heart failure (HCC) 12/21/2023   Episode of shaking 12/21/2023   Seizure (HCC) 12/13/2023   Bilateral pleural effusion 12/13/2023   Elevated troponin 12/13/2023   Atrial fibrillation, chronic (HCC) 12/13/2023   Essential hypertension 12/13/2023   Transaminitis 12/13/2023   NICM (nonischemic cardiomyopathy) (HCC) 12/13/2023   PCP:  Salli Amato, MD Pharmacy:   [facility] [phone] [place], [place] - [address] AT [facility] OF [address] [place] [address] Phone: [phone] Fax: [phone]  [id number] - [place], [place] - [address] [place] [address] Phone: [phone] Fax: [phone]     Social Drivers of Health (SDOH) Social History: SDOH Screenings   Food Insecurity: No Food Insecurity (04/07/2024)  Housing: Low Risk  (04/07/2024)  Transportation Needs: No Transportation Needs (04/07/2024)  Utilities: Not At Risk (04/07/2024)  Financial Resource Strain: Low Risk  (02/08/2024)   Received from [facility]  Physical Activity: Sufficiently Active (11/01/2023)   Received from [facility]  Social Connections: Moderately Isolated (04/07/2024)  Tobacco Use: Low Risk  (02/10/2024)   Received from [facility]  Recent Concern: Tobacco Use - Medium Risk (12/13/2023)   SDOH Interventions:     Readmission Risk Interventions     No data to display

## 2024-04-09 NOTE — Progress Notes (Signed)
 Sports coach Note  New referral for hospice services at a facility upon discharge from [facility] received from [place], TOC.  Spoke with patient's daughter, Olaf Mesa to initiate education related to hospice philosophy, team approach to care and hospice Medicare benefit.  After discussion, daughter chooses palliative at facility.  Location to be determined.  Notified [place], TOC, Devere Sacks, NP and hospital medical care team of her decision.  Hospital Liaison team will follow through final disposition.  Thank you for allowing participation in this patient's care.  Saddie HILARIO Na, RN Nurse Liaison [phone]

## 2024-04-10 ENCOUNTER — Encounter: Payer: Self-pay | Admitting: Family Medicine

## 2024-04-10 DIAGNOSIS — D649 Anemia, unspecified: Secondary | ICD-10-CM | POA: Diagnosis present

## 2024-04-10 DIAGNOSIS — W19XXXA Unspecified fall, initial encounter: Secondary | ICD-10-CM | POA: Diagnosis not present

## 2024-04-10 DIAGNOSIS — I482 Chronic atrial fibrillation, unspecified: Secondary | ICD-10-CM | POA: Diagnosis present

## 2024-04-10 DIAGNOSIS — I480 Paroxysmal atrial fibrillation: Secondary | ICD-10-CM | POA: Diagnosis present

## 2024-04-10 DIAGNOSIS — Z515 Encounter for palliative care: Secondary | ICD-10-CM | POA: Diagnosis not present

## 2024-04-10 DIAGNOSIS — E860 Dehydration: Secondary | ICD-10-CM | POA: Diagnosis present

## 2024-04-10 DIAGNOSIS — K219 Gastro-esophageal reflux disease without esophagitis: Secondary | ICD-10-CM | POA: Diagnosis present

## 2024-04-10 DIAGNOSIS — G3183 Dementia with Lewy bodies: Secondary | ICD-10-CM | POA: Diagnosis present

## 2024-04-10 DIAGNOSIS — I10 Essential (primary) hypertension: Secondary | ICD-10-CM | POA: Diagnosis not present

## 2024-04-10 DIAGNOSIS — I2510 Atherosclerotic heart disease of native coronary artery without angina pectoris: Secondary | ICD-10-CM | POA: Diagnosis present

## 2024-04-10 DIAGNOSIS — I428 Other cardiomyopathies: Secondary | ICD-10-CM | POA: Diagnosis present

## 2024-04-10 DIAGNOSIS — R634 Abnormal weight loss: Secondary | ICD-10-CM | POA: Diagnosis present

## 2024-04-10 DIAGNOSIS — R296 Repeated falls: Secondary | ICD-10-CM | POA: Diagnosis present

## 2024-04-10 DIAGNOSIS — E271 Primary adrenocortical insufficiency: Secondary | ICD-10-CM | POA: Diagnosis present

## 2024-04-10 DIAGNOSIS — R824 Acetonuria: Secondary | ICD-10-CM | POA: Diagnosis present

## 2024-04-10 DIAGNOSIS — E039 Hypothyroidism, unspecified: Secondary | ICD-10-CM | POA: Diagnosis present

## 2024-04-10 DIAGNOSIS — Z66 Do not resuscitate: Secondary | ICD-10-CM | POA: Diagnosis present

## 2024-04-10 DIAGNOSIS — F0282 Dementia in other diseases classified elsewhere, unspecified severity, with psychotic disturbance: Secondary | ICD-10-CM | POA: Diagnosis present

## 2024-04-10 DIAGNOSIS — E871 Hypo-osmolality and hyponatremia: Secondary | ICD-10-CM | POA: Diagnosis present

## 2024-04-10 DIAGNOSIS — E162 Hypoglycemia, unspecified: Secondary | ICD-10-CM | POA: Diagnosis present

## 2024-04-10 DIAGNOSIS — Z7901 Long term (current) use of anticoagulants: Secondary | ICD-10-CM | POA: Diagnosis not present

## 2024-04-10 DIAGNOSIS — I110 Hypertensive heart disease with heart failure: Secondary | ICD-10-CM | POA: Diagnosis present

## 2024-04-10 DIAGNOSIS — Z1152 Encounter for screening for COVID-19: Secondary | ICD-10-CM | POA: Diagnosis not present

## 2024-04-10 DIAGNOSIS — I5042 Chronic combined systolic (congestive) and diastolic (congestive) heart failure: Secondary | ICD-10-CM | POA: Diagnosis present

## 2024-04-10 DIAGNOSIS — Z7189 Other specified counseling: Secondary | ICD-10-CM | POA: Diagnosis not present

## 2024-04-10 DIAGNOSIS — F02811 Dementia in other diseases classified elsewhere, unspecified severity, with agitation: Secondary | ICD-10-CM | POA: Diagnosis present

## 2024-04-10 DIAGNOSIS — Z79899 Other long term (current) drug therapy: Secondary | ICD-10-CM | POA: Diagnosis not present

## 2024-04-10 DIAGNOSIS — I447 Left bundle-branch block, unspecified: Secondary | ICD-10-CM | POA: Diagnosis present

## 2024-04-10 LAB — BASIC METABOLIC PANEL WITH GFR
Anion gap: 6 (ref 5–15)
BUN: 17 mg/dL (ref 8–23)
CO2: 25 mmol/L (ref 22–32)
Calcium: 8.1 mg/dL — ABNORMAL LOW (ref 8.9–10.3)
Chloride: 95 mmol/L — ABNORMAL LOW (ref 98–111)
Creatinine, Ser: 0.88 mg/dL (ref 0.61–1.24)
GFR, Estimated: >60 mL/min (ref >60)
Glucose, Bld: 80 mg/dL (ref 70–99)
Potassium: 4.4 mmol/L (ref 3.5–5.1)
Sodium: 126 mmol/L — ABNORMAL LOW (ref 135–145)

## 2024-04-10 LAB — TSH: TSH: 2.68 u[IU]/mL (ref 0.350–4.500)

## 2024-04-10 LAB — MAGNESIUM: Magnesium: 1.9 mg/dL (ref 1.7–2.4)

## 2024-04-10 LAB — GLUCOSE, CAPILLARY: Glucose-Capillary: 144 mg/dL — ABNORMAL HIGH (ref 70–99)

## 2024-04-10 LAB — T4, FREE: Free T4: 1.25 ng/dL — ABNORMAL HIGH (ref 0.61–1.12)

## 2024-04-10 LAB — VITAMIN B12: Vitamin B-12: 584 pg/mL (ref 180–914)

## 2024-04-10 MED ORDER — SODIUM CHLORIDE 0.9 % IV SOLN: INTRAVENOUS | Status: DC

## 2024-04-10 MED ORDER — HYDROCORTISONE SOD SUC (PF) 100 MG IJ SOLR
50.0000 mg | Freq: Three times a day (TID) | INTRAMUSCULAR | Status: AC
  Administered 2024-04-10 (×2): 50 mg via INTRAVENOUS
  Filled 2024-04-10 (×2): qty 1

## 2024-04-10 MED ORDER — HYDROCORTISONE 5 MG PO TABS
15.0000 mg | ORAL_TABLET | Freq: Two times a day (BID) | ORAL | Status: DC
  Filled 2024-04-10: qty 3

## 2024-04-10 NOTE — Progress Notes (Signed)
 [facility] Room 213 [facility] Liaison Note  Hospital Liaison team will follow through discharge disposition.  Patient may need SNF rehab with palliative.  Depending on how patient does over the next few days, he may need to be evaluated for the Hospice Home.    Please call with any hospice related questions or concerns   Thank you for the opportunity to participate in this patient's care.  [facility] Liaison [phone]

## 2024-04-10 NOTE — Progress Notes (Signed)
 Palliative Care Progress Note, Assessment & Plan   Patient Name: Bob Ortiz       Date: 04/10/2024 DOB: 28-Apr-1941  Age: 83 y.o. MRN#: [id number] Attending Physician: Fausto Burnard LABOR, DO Primary Care Physician: Salli Amato, MD Admit Date: 04/06/2024  Subjective: Unable to assess  HPI: 83 y.o. male  with past medical history significant for paroxysmal Afib s/p pacemaker, NICM, combined systolic and diastolic CHF, Addison's disease, hypothyroidism and LBBB. Patient presented to ED 04/06/2024 from home via EMS c/o generalized weakness, falls and inability to ambulate. On arrival to ED, pt was unable to provide history. Daughter and wife report patient had fall the day prior with concern for seizure since he bit his tongue. Pt was found to be hypoglycemic. Family also share patient has experienced significant weight loss over the past several weeks. Per chart, he was being evaluated by neurology at [facility] for bradykinesia and Lewy Body dementia.    ED workup found Na+ 134, glucose 62-->85, creatinine 0.55, calcium 8.2, alk phos 143, albumin 3.0, AST 54, t bili 1.3, BNP 215.8, and troponin 25-->28. WBC 5.2, Hgb 12.4. CT head revealed no acute intracranial abnormalities. 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  ED vitals 106/72, HR 70, RR 18, SpO2 99% and 97.6   TRH was consulted for admission and management of hypoglycemia, falls and weight loss.    Palliative was consulted for assistance with goals of conversations.     Summary of counseling/coordination of care: Extensive chart review completed prior to meeting patient including labs, vital signs, imaging, progress notes, orders, and available advanced directive  documents from current and previous encounters.     Latest Ref Rng & Units 04/09/2024    8:53 AM 04/08/2024    4:51 AM 04/07/2024    5:24 AM  CBC  WBC 4.0 - 10.5 K/uL 5.3  5.6  5.4   Hemoglobin 13.0 - 17.0 g/dL 88.4  89.3  88.8   Hematocrit 39.0 - 52.0 % 33.5  30.9  33.2   Platelets 150 - 400 K/uL 190  157  172       Latest Ref Rng & Units 04/10/2024    3:54 AM 04/09/2024    8:53 AM 04/08/2024    4:51 AM  CMP  Glucose 70 - 99 mg/dL 80  74  73   BUN 8 - 23 mg/dL 17  15  12    Creatinine 0.61 - 1.24 mg/dL 9.11  9.21  9.28   Sodium 135 - 145 mmol/L 126  128  128   Potassium 3.5 - 5.1 mmol/L 4.4  4.1  3.9   Chloride 98 - 111 mmol/L 95  96  96   CO2 22 - 32 mmol/L 25  24  25    Calcium 8.9 - 10.3 mg/dL 8.1  8.2  7.9     After reviewing the patient's chart and assessing the patient at bedside, I spoke with to patient's wife and daughter in regards to symptom management and goals of care.   Ill-appearing, elderly male lying in bed.  He does not acknowledge my presence and is unable to participate in conversation.  Patient appears to be actively hallucinating with grabbing at things that are not there.  Patient appears to have significant decline over the past few days.  Patient's wife and Dorthea, daughter, shares concern that family members would not be in the home this week leaving their mother to care for Mr. Mirza if he is sent home with hospice.  Since patient's wife is unable to care for him if he were to be discharged home with hospice.  I shared concern that patient has seemed to have significant decline over the weekend.  We discussed that patient may be more appropriate for hospice home in the next couple of days if he continues to refuse food and becomes less interactive.  Both wife and daughter are understanding and open to hospice liaison evaluating patient over the next couple of days.  Discussed with Dr. Fausto and [place], [facility], plan to reevaluate patient daily for [facility] hospice  appropriateness.  Therapeutic silence and active listening provided for family to share their thoughts and emotions regarding current medical situation.  Emotional support provided.  Physical Exam Vitals reviewed.  Constitutional:      General: He is not in acute distress.    Appearance: He is ill-appearing.  HENT:     Head: Normocephalic and atraumatic.     Mouth/Throat:     Mouth: Mucous membranes are dry.  Pulmonary:     Effort: Pulmonary effort is normal. No respiratory distress.  Skin:    General: Skin is warm and dry.  Neurological:     Mental Status: He is alert. He is disoriented.     Motor: Weakness present.   Recommendations/Plan: Continue DNR/DNI status as previously documented    Continue current supportive interventions SNF/LTC versus [facility] hospice TOC and hospice liaison engaged to determine plan of care  Total Time 50 minutes   Discussed plan of care with Dr. Fausto, [facility], [facility] liaison and nursing staff  Time spent includes: Detailed review of medical records (labs, imaging, vital signs), medically appropriate exam (mental status, respiratory, cardiac, skin), discussed with treatment team, counseling and educating patient, family and staff, documenting clinical information, medication management and coordination of care.     Devere Sacks, AMANDA [facility] Palliative Medicine Team  04/10/2024 12:49 PM  Office [phone]  Pager [phone]

## 2024-04-10 NOTE — NC FL2 (Signed)
 [place]  MEDICAID FL2 LEVEL OF CARE FORM     IDENTIFICATION  Patient Name: Bob Ortiz Birthdate: Apr 25, 1941 Sex: male Admission Date (Current Location): 04/06/2024  [facility] and [place] Number:  Chiropodist and Address:  [facility], [address] [place], [place] [address]      Provider Number: [id number]  Attending Physician Name and Address:  Fausto Burnard LABOR, DO  Relative Name and Phone Number:  Ladarrion Telfair [phone]    Current Level of Care: Hospital Recommended Level of Care: Memory Care Prior Approval Number:    Date Approved/Denied:   PASRR Number:    Discharge Plan: Other (Comment) (MEMORY CARE)    Current Diagnoses: Patient Active Problem List   Diagnosis Date Noted   GERD without esophagitis 04/07/2024   Hypoglycemia 04/06/2024   Acute on chronic congestive heart failure (HCC) 12/21/2023   Episode of shaking 12/21/2023   Seizure (HCC) 12/13/2023   Bilateral pleural effusion 12/13/2023   Elevated troponin 12/13/2023   Atrial fibrillation, chronic (HCC) 12/13/2023   Essential hypertension 12/13/2023   Transaminitis 12/13/2023   NICM (nonischemic cardiomyopathy) (HCC) 12/13/2023    Orientation RESPIRATION BLADDER Height & Weight     Self   (bilateral breath sounds diminished) External catheter Weight: 150 lb 5.7 oz (68.2 kg) Height:     BEHAVIORAL SYMPTOMS/MOOD NEUROLOGICAL BOWEL NUTRITION STATUS     (LB DEMENTIA) Continent Diet (Diet regular Room service appropriate? Yes; Fluid consistency: Thin: General starting at 09/07 1935)  AMBULATORY STATUS COMMUNICATION OF NEEDS Skin   Extensive Assist Verbally Other (Comment) (Ecchymosis)                       Personal Care Assistance Level of Assistance  Total care       Total Care Assistance: Limited assistance   Functional Limitations Info  Sight, Hearing, Speech Sight Info: Adequate        SPECIAL CARE FACTORS FREQUENCY                        Contractures Contractures Info: Present    Additional Factors Info  Code Status, Allergies Code Status Info: DNR-LIMITED Allergies Info: Quinapril; Bumetanide ; Spironolactone; Torsemide           Current Medications (04/10/2024):  This is the current hospital active medication list Current Facility-Administered Medications  Medication Dose Route Frequency Provider Last Rate Last Admin   acetaminophen  (TYLENOL ) tablet 650 mg  650 mg Oral Q6H PRN Mansy, Jan A, MD       Or   acetaminophen  (TYLENOL ) suppository 650 mg  650 mg Rectal Q6H PRN Mansy, Jan A, MD       apixaban  (ELIQUIS ) tablet 5 mg  5 mg Oral BID Mansy, Jan A, MD   5 mg at 04/09/24 2244   carvedilol  (COREG ) tablet 3.125 mg  3.125 mg Oral BID WC Franchot Novel, MD   3.125 mg at 04/09/24 1642   cholecalciferol  (VITAMIN D3) 25 MCG (1000 UNIT) tablet 1,000 Units  1,000 Units Oral Daily Mansy, Jan A, MD   1,000 Units at 04/09/24 9061   cyanocobalamin  (VITAMIN B12) tablet 1,000 mcg  1,000 mcg Oral Daily Mansy, Jan A, MD   1,000 mcg at 04/09/24 9061   ethacrynic  acid (EDECRIN ) tablet 50 mg  50 mg Oral Daily PRN Mansy, Jan A, MD       hydrocortisone  sodium succinate  (SOLU-CORTEF ) 100 MG injection 50 mg  50 mg Intravenous Q8H  Franchot Novel, MD       magnesium  hydroxide (MILK OF MAGNESIA) suspension 30 mL  30 mL Oral Daily PRN Mansy, Jan A, MD       magnesium  oxide (MAG-OX) tablet 800 mg  800 mg Oral Daily Mansy, Jan A, MD   800 mg at 04/09/24 9061   ondansetron  (ZOFRAN ) tablet 4 mg  4 mg Oral Q6H PRN Mansy, Jan A, MD       Or   ondansetron  (ZOFRAN ) injection 4 mg  4 mg Intravenous Q6H PRN Mansy, Jan A, MD       QUEtiapine  (SEROQUEL ) tablet 12.5 mg  12.5 mg Oral QHS Franchot Novel, MD   12.5 mg at 04/09/24 2244   traZODone  (DESYREL ) tablet 25 mg  25 mg Oral QHS PRN Mansy, Jan A, MD   25 mg at 04/08/24 2138     Discharge Medications: Please see discharge summary for a list of discharge medications.  Relevant Imaging  Results:  Relevant Lab Results:   Additional Information [id number]  [place], [place]

## 2024-04-10 NOTE — Progress Notes (Signed)
 Progress Note   Patient: Bob Ortiz FMW:[id number] DOB: 09-19-1940 DOA: 04/06/2024     0 DOS: the patient was seen and examined on 04/10/2024   Brief hospital course: HPI on admission 04/06/24: Bob Ortiz is a 83 y.o. Caucasian male with medical history significant for paroxysmal atrial fibrillation status post pacemaker, nonischemic cardiomyopathy, combined systolic and diastolic CHF, Addison's disease, hypothyroidism and left bundle blanch block. who presented to the emergency room with acute onset of generalized weakness and subsequent fall with inability to ambulate.  Patients daughter helps with history and states that patient fell a day before admission and on the day of admission. She feels he might have had a seizure given he bit his tongue. The patient was found to have low blood glucose in the ER.  He denied any presyncope or syncope.  No dysuria, oliguria or hematuria or flank pain.  No cough or wheezing or dyspnea.  No chest pain or palpitations.  He does not recall hitting his head.  Per his wife he has lost significant weight over the last several weeks.  He was advised to lose weight at 185 however he went down to 160 in an unintentional way per his wife. ... See H&P for full HPI on admission & ED course.  Patient was admitted for further evaluation and management of altered mental status. He was found to be hypoglycemic and hyponatremic.  Palliative care was consulted for goals of care discussions.    Further hospital course and management as outlined below.    Assessment and Plan:  Recurrent Falls Altered Mental status  Hx of Adrenal insufficiency Likely multifactorial. After further review of EMR, suspect acute worsening of mentation, weakness, anorexia and weight loss is a result of secondary adrenal insufficiency.  Patient presented with BG in the 60s and hyponatremia. His AM cortisol (Drawn 0830) 5.8 micrograms/dL. 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 He was eventually transitioned to prednisone . Per patient's daughter she is not sure patient has been regularly taking his medications appropriately has he did not ask for assistance until 08/2023.  Patient has also followed with neurology and is thought to have lewy body dementia which is also likely contributing.  --Resumed on steroids - currently IV hydrocortisone  50 mg Q8H --PT/OT evaluations --Fall precautions --Mgmt of underlying issues as outlined    Hyponatremia - Mild. Possibly in setting of above.  Appears euvolemic to slightly dry on exam. Suspect due to ?Addison's and/or poor PO intake Na trending down: 134 >> 133 >> 128 >> 128 >> 126 --Start IV fluids --  NS @ 75 cc/hr --Monitor BMP's --Encourage PO intake.   Weight loss, unintentional  Family does note that patient has been eating and drinking less.   Recent CT chest and MRI brain no evidence of mass or LN to suggest malignancy.  Possibly related to LB dementia v adrenal insufficiency.    GERD without esophagitis - Continue PPI therapy.   Essential hypertension --Continue Coreg  --Monitor BP's   Paroxysmal atrial fibrillation, chronic --Continue Coreg  and Eliquis    Combined systolic and diastolic HF Appears compensated to dry on exam --Holding home jardiance, and torsemide  --Monitor volume status   ?Hypothyroidism  TSH WNL 3 mo ago. Diagnosis is unclear. Was acutely ill when initially diagnosed. Not currently on levothyroxine.  TSH normal 2.680, free T4 mildly elevated 1.25 --Follow up with PCP --No indication to start  treatment at this timef   Normocytic anemia  - Mild. Stable  B12 low ~7 mo. Ago. Folate WNL. Iron panel WNL.  --Continue vitamin B12 supplement  Lewy Body dementia Recently diagnosed. Referred to movement clinic at [facility].  Discussed with patient family he may not return to baseline.  --Delirium precautions --Palliative  vs Hospice to follow after d/c      Subjective: Pt seen with wife and daughter present this AM.  Pt with dementia, poor historian, denies any acute complaints.  Currently medical status and declining sodium levels discussed.  Family agreeable to attempt IV fluids and monitor response, plan for enrolling pt in hospice vs palliative care at time of d/c.  Home with hospice has been the plan, but not safe until there is additional family caregiver support available.   Physical Exam: Vitals:   04/09/24 0904 04/09/24 1927 04/10/24 0412 04/10/24 0745  BP: 117/68 100/61 98/65 118/74  Pulse: 71 73 71   Resp: (!) 21 16 16 18   Temp:  98.4 F (36.9 C) 98.5 F (36.9 C) 98.5 F (36.9 C)  TempSrc:  Oral Oral Axillary  SpO2: 97% 95% 96% 99%  Weight:       General exam: awake, alert, no acute distress HEENT: moist mucus membranes, hearing grossly normal  Respiratory system: CTAB, no wheezes, rales or rhonchi, normal respiratory effort. Cardiovascular system: normal S1/S2, RRR, no pedal edema.   Gastrointestinal system: soft, NT, ND Central nervous system: exam limted by dementia, no gross focal neurologic deficits, normal but minimal speech Skin: dry, intact, normal temperature Psychiatry: normal mood, congruent affect, abnormal judgement and insight due to dementia   Data Reviewed:  Sodium down-trending 128 >> 126 today, was 134 on admission   Family Communication: wife and daughter at bedside on rounds  Disposition: Status is: Inpatient Remains inpatient appropriate because: hyponatremia on IV fluids, on IV steroid   Planned Discharge Destination: Home vs SNF with palliative vs hospice    Time spent: 55 minutes including time at bedside and in coordination of care with staff and consultants  Author: Burnard DELENA Cunning, DO 04/10/2024 2:09 PM  For on call review [email or web address].

## 2024-04-10 NOTE — Progress Notes (Signed)
 Physical Therapy Treatment Patient Details Name: Bob Ortiz MRN: [id number] DOB: 06-28-1941 Today's Date: 04/10/2024   History of Present Illness Bob Ortiz is an 83 y.o. caucasian male with medical history significant for paroxysmal atrial fibrillation status post pacemaker, nonischemic cardiomyopathy, combined systolic and diastolic CHF, Addison's disease, hypothyroidism and left bundle blanch block who presented to the emergency room with acute onset of generalized weakness and subsequent fall with inability to ambulate. MD assessment includes falls, AMS, and hyponatremia.    PT Comments  Pt confused with flat affect and required extensive multi-modal cuing to follow 1-step commands.  Pt required mod A for BLE and trunk control during sup to/from sit and once in sitting at the EOB presented with poor static sitting balance. Pt participated in extensive anterior weight shifting activities at the EOB to address posterior lean with gross improvement noted to sitting balance but only temporarily with pt slow reverting back to posterior instability.  Multiple attempts made to stand at the EOB with pt unable to do so being limited by cognition as well as posterior bias.  Pt will benefit from a trial of continued PT services upon discharge to safely address deficits listed in patient problem list for decreased caregiver assistance and eventual return to PLOF.    If plan is discharge home, recommend the following: A lot of help with walking and/or transfers;A lot of help with bathing/dressing/bathroom;Direct supervision/assist for medications management;Help with stairs or ramp for entrance;Supervision due to cognitive status   Can travel by private vehicle     No  Equipment Recommendations  BSC/3in1    Recommendations for Other Services       Precautions / Restrictions Precautions Precautions: Fall Recall of Precautions/Restrictions: Impaired Restrictions Weight Bearing Restrictions Per  Provider Order: No     Mobility  Bed Mobility Overal bed mobility: Needs Assistance       Supine to sit: Mod assist Sit to supine: Mod assist   General bed mobility comments: Mod A for BLE and trunk control with max multi-modal cues to initiate movement    Transfers                   General transfer comment: Multiple attempts made to come to standing with pt unable to clear the surface of the bed this session    Ambulation/Gait                   Stairs             Wheelchair Mobility     Tilt Bed    Modified Rankin (Stroke Patients Only)       Balance Overall balance assessment: Needs assistance Sitting-balance support: Bilateral upper extremity supported, Feet supported Sitting balance-Leahy Scale: Poor Sitting balance - Comments: Frequent min to mod A for sitting balance at EOB Postural control: Posterior lean                                  Communication Communication Communication: Impaired Factors Affecting Communication: Difficulty expressing self  Cognition Arousal: Alert Behavior During Therapy: Flat affect   PT - Cognitive impairments: History of cognitive impairments                         Following commands: Impaired Following commands impaired: Follows one step commands inconsistently    Cueing Cueing Techniques: Verbal cues, Tactile cues, Visual cues  Exercises Other Exercises Other Exercises: Extensive anterior weight shifting at the EOB to address posterior lean    General Comments        Pertinent Vitals/Pain Pain Assessment Pain Assessment: No/denies pain    Home Living                          Prior Function            PT Goals (current goals can now be found in the care plan section) Progress towards PT goals: Not progressing toward goals - comment (limited by cognition and functional weakness)    Frequency    Min 2X/week      PT Plan       Co-evaluation              AM-PAC PT 6 Clicks Mobility   Outcome Measure  Help needed turning from your back to your side while in a flat bed without using bedrails?: A Little Help needed moving from lying on your back to sitting on the side of a flat bed without using bedrails?: A Lot Help needed moving to and from a bed to a chair (including a wheelchair)?: Total Help needed standing up from a chair using your arms (e.g., wheelchair or bedside chair)?: Total Help needed to walk in hospital room?: Total Help needed climbing 3-5 steps with a railing? : Total 6 Click Score: 9    End of Session Equipment Utilized During Treatment: Gait belt Activity Tolerance: Patient tolerated treatment well Patient left: in bed;with call bell/phone within reach;with bed alarm set;with family/visitor present Nurse Communication: Mobility status PT Visit Diagnosis: Unsteadiness on feet (R26.81);Muscle weakness (generalized) (M62.81);History of falling (Z91.81);Repeated falls (R29.6);Difficulty in walking, not elsewhere classified (R26.2)     Time: 9078-9058 PT Time Calculation (min) (ACUTE ONLY): 20 min  Charges:    $Therapeutic Activity: 8-22 mins PT General Charges $$ ACUTE PT VISIT: 1 Visit                     D. Scott Skylie Hiott PT, DPT 04/10/24, 11:22 AM

## 2024-04-11 DIAGNOSIS — E162 Hypoglycemia, unspecified: Secondary | ICD-10-CM | POA: Diagnosis not present

## 2024-04-11 LAB — BASIC METABOLIC PANEL WITH GFR
Anion gap: 8 (ref 5–15)
BUN: 21 mg/dL (ref 8–23)
CO2: 23 mmol/L (ref 22–32)
Calcium: 8.3 mg/dL — ABNORMAL LOW (ref 8.9–10.3)
Chloride: 96 mmol/L — ABNORMAL LOW (ref 98–111)
Creatinine, Ser: 0.71 mg/dL (ref 0.61–1.24)
GFR, Estimated: >60 mL/min (ref >60)
Glucose, Bld: 97 mg/dL (ref 70–99)
Potassium: 3.6 mmol/L (ref 3.5–5.1)
Sodium: 127 mmol/L — ABNORMAL LOW (ref 135–145)

## 2024-04-11 LAB — GLUCOSE, CAPILLARY
Glucose-Capillary: 109 mg/dL — ABNORMAL HIGH (ref 70–99)
Glucose-Capillary: 133 mg/dL — ABNORMAL HIGH (ref 70–99)
Glucose-Capillary: 97 mg/dL (ref 70–99)

## 2024-04-11 MED ORDER — LOPERAMIDE HCL 2 MG PO CAPS
2.0000 mg | ORAL_CAPSULE | ORAL | Status: DC | PRN
  Administered 2024-04-11: 2 mg via ORAL
  Filled 2024-04-11: qty 1

## 2024-04-11 MED ORDER — PREDNISONE 10 MG PO TABS
5.0000 mg | ORAL_TABLET | Freq: Every day | ORAL | Status: DC
  Administered 2024-04-12 – 2024-04-17 (×6): 5 mg via ORAL
  Filled 2024-04-11 (×6): qty 1

## 2024-04-11 MED ORDER — ENSURE PLUS HIGH PROTEIN PO LIQD
237.0000 mL | Freq: Two times a day (BID) | ORAL | Status: DC
  Administered 2024-04-11: 237 mL via ORAL

## 2024-04-11 NOTE — Progress Notes (Signed)
 Progress Note   Patient: Bob Ortiz DOB: 1941/03/06 DOA: 04/06/2024     1 DOS: the patient was seen and examined on 04/11/2024   Brief hospital course: HPI on admission 04/06/24: Bob Ortiz is a 83 y.o. Caucasian male with medical history significant for paroxysmal atrial fibrillation status post pacemaker, nonischemic cardiomyopathy, combined systolic and diastolic CHF, Addison's disease, hypothyroidism and left bundle blanch block. who presented to the emergency room with acute onset of generalized weakness and subsequent fall with inability to ambulate.  Patients daughter helps with history and states that patient fell a day before admission and on the day of admission. She feels he might have had a seizure given he bit his tongue. The patient was found to have low blood glucose in the ER.  He denied any presyncope or syncope.  No dysuria, oliguria or hematuria or flank pain.  No cough or wheezing or dyspnea.  No chest pain or palpitations.  He does not recall hitting his head.  Per his wife he has lost significant weight over the last several weeks.  He was advised to lose weight at 185 however he went down to 160 in an unintentional way per his wife. ... See H&P for full HPI on admission & ED course.  Patient was admitted for further evaluation and management of altered mental status. He was found to be hypoglycemic and hyponatremic.  Palliative care was consulted for goals of care discussions.    Further hospital course and management as outlined below.    Assessment and Plan:  Recurrent Falls Altered Mental status  Hx of Adrenal insufficiency Likely multifactorial. After further review of EMR, suspect acute worsening of mentation, weakness, anorexia and weight loss is a result of secondary adrenal insufficiency.  Patient presented with BG in the 60s and hyponatremia. His AM cortisol (Drawn 0830) 5.8 micrograms/dL. He was initially diagnosed with this at [facility] in 2019 in  setting of pituitary mass and was started on hydrocortisone  at that time. He was eventually transitioned to prednisone . Per patient's daughter she is not sure patient has been regularly taking his medications appropriately has he did not ask for assistance until 08/2023.  Patient has also followed with neurology and is thought to have lewy body dementia which is also likely contributing.  --Completed IV Solu-Cortef  --Resume home dose Prednisone  5 mg daily --PT/OT evaluations --Fall precautions --Mgmt of underlying issues as outlined    Hyponatremia - Mild. Possibly in setting of above.  Appears euvolemic to slightly dry on exam. Suspect due to ?Addison's and/or poor PO intake Na trending down: 134 >> 133 >> 128 >> 128 >> 126 (started fluids) >> 127 --Continue IV fluids --  NS @ 75 cc/hr --Monitor BMP's --Encourage PO intake.   Weight loss, unintentional  Family does note that patient has been eating and drinking less.   Recent CT chest and MRI brain no evidence of mass or LN to suggest malignancy.  Possibly related to LB dementia v adrenal insufficiency.    GERD without esophagitis - Continue PPI therapy.   Essential hypertension --Continue Coreg  --Monitor BP's   Paroxysmal atrial fibrillation, chronic --Continue Coreg  and Eliquis    Combined systolic and diastolic HF Appears compensated to dry on exam --Holding home jardiance, and torsemide  --Monitor volume status   ?Hypothyroidism  TSH WNL 3 mo ago. Diagnosis is unclear. Was acutely ill when initially diagnosed. Not currently on levothyroxine.  TSH normal 2.680, free T4 mildly elevated 1.25 --Follow up with PCP --  No indication to start treatment at this timef   Normocytic anemia  - Mild. Stable  B12 low ~7 mo. Ago. Folate WNL. Iron panel WNL.  --Continue vitamin B12 supplement  Lewy Body dementia Recently diagnosed. Referred to movement clinic at [facility].  Discussed with patient family he may not return to baseline.   --Delirium precautions --Low dose Seroquel  at bedtime --Palliative vs Hospice to follow after d/c      Subjective: Pt seen with wife present this AM.  Pt awake resting in bed. He responds to questions better than yesterday when he was much less interactive. Wife feels IV fluids might have helped him perk up, she noticed this earlier when fluids given also.  Pt denies pain, breathing trouble or other complaints.  No acute events or significant agitation reported.   Physical Exam: Vitals:   04/10/24 1417 04/10/24 1950 04/11/24 0317 04/11/24 0754  BP: 130/81 119/82 110/66 103/66  Pulse: 78 75 72 73  Resp: 18 16 16 18   Temp: 98.4 F (36.9 C) 99.6 F (37.6 C) 97.8 F (36.6 C) 97.9 F (36.6 C)  TempSrc: Oral Oral Oral   SpO2: 100% 100% 98% 98%  Weight:       General exam: awake, alert, no acute distress, more interactive & responsive today HEENT: moist mucus membranes, hearing grossly normal  Respiratory system: CTAB, no wheezes, rales or rhonchi, normal respiratory effort. Cardiovascular system: normal S1/S2, RRR, no pedal edema.   Gastrointestinal system: soft, NT, ND Central nervous system: exam limted by dementia, no gross focal neurologic deficits, normal speech but low volume Skin: dry, intact, normal temperature Psychiatry: normal mood, congruent affect, abnormal judgement and insight due to dementia   Data Reviewed:  Sodium down-trending 128 >> 126 (fluids started) >> 127 today, Na was 134 on admission Cl 96 Ca 8.3  Family Communication: wife at bedside on rounds  Disposition: Status is: Inpatient Remains inpatient appropriate because: hyponatremia remaining on IV fluids   Planned Discharge Destination: Home vs SNF with palliative vs hospice    Time spent: 42 minutes  Author: Burnard DELENA Cunning, DO 04/11/2024 2:27 PM  For on call review [email or web address].

## 2024-04-11 NOTE — Progress Notes (Signed)
 [facility] Room 213 [facility] Liaison Note  HL team will continue to monitor patient's condition to determine needs at discharge.    Please call with any hospice related questions or concerns.  Thank you for the opportunity to participate in this patient's care  [facility] Liaison 336 [phone]

## 2024-04-11 NOTE — Plan of Care (Signed)
                                                     Palliative Care Progress Note   Patient Name: Bob Ortiz       Date: 04/11/2024 DOB: 1941-07-06  Age: 83 y.o. MRN#: [id number] Attending Physician: Fausto Burnard LABOR, DO Primary Care Physician: Salli Amato, MD Admit Date: 04/06/2024  Extensive chart review completed including labs, vital signs, imaging, progress notes, orders, and available advanced directive documents from current and previous encounters.   Counseled with attending Dr. Fausto today, who conveyed there are no acute palliative needs at this time.  ACC, TOC following closely for discahrge planning. Time for outcomes needed.   PMT will continue to follow and support.  Thank you for allowing the Palliative Medicine Team to assist in the care of [name].  Lamarr L. Arvid, DNP, FNP-BC Palliative Medicine Team    No charge

## 2024-04-12 DIAGNOSIS — K219 Gastro-esophageal reflux disease without esophagitis: Secondary | ICD-10-CM | POA: Diagnosis not present

## 2024-04-12 DIAGNOSIS — I10 Essential (primary) hypertension: Secondary | ICD-10-CM | POA: Diagnosis not present

## 2024-04-12 DIAGNOSIS — I482 Chronic atrial fibrillation, unspecified: Secondary | ICD-10-CM | POA: Diagnosis not present

## 2024-04-12 DIAGNOSIS — E162 Hypoglycemia, unspecified: Secondary | ICD-10-CM | POA: Diagnosis not present

## 2024-04-12 NOTE — Progress Notes (Addendum)
 [facility] Room 213 [facility] Liaison Note   HL team will continue to monitor patient's condition to determine needs at discharge. Patient not IPU appropriate at this time.     Please call with any hospice related questions or concerns.   Thank you for the opportunity to participate in this patient's care   [facility] Liaison [phone]

## 2024-04-12 NOTE — Progress Notes (Signed)
 Physical Therapy Treatment Patient Details Name: STEWARD SAMES MRN: [id number] DOB: 15-Sep-1940 Today's Date: 04/12/2024   History of Present Illness MERIK MIGNANO is an 83 y.o. caucasian male with medical history significant for paroxysmal atrial fibrillation status post pacemaker, nonischemic cardiomyopathy, combined systolic and diastolic CHF, Addison's disease, hypothyroidism and left bundle blanch block who presented to the emergency room with acute onset of generalized weakness and subsequent fall with inability to ambulate. MD assessment includes falls, AMS, and hyponatremia.    PT Comments  Patient continues to be limited by cognitive deficits. Oriented to self only during session and constant redirecting. Required min-modA for bed mobility and minA+2 to stand at bedside with HHAx2. Transferred to/from [facility] with minA+2 for safety. Successful liquid BM on BSC. Wife present and supportive. Discharge plan remains appropriate.    If plan is discharge home, recommend the following: A lot of help with walking and/or transfers;A lot of help with bathing/dressing/bathroom;Direct supervision/assist for medications management;Help with stairs or ramp for entrance;Supervision due to cognitive status   Can travel by private vehicle     No  Equipment Recommendations  BSC/3in1    Recommendations for Other Services       Precautions / Restrictions Precautions Precautions: Fall Recall of Precautions/Restrictions: Impaired Restrictions Weight Bearing Restrictions Per Provider Order: No     Mobility  Bed Mobility Overal bed mobility: Needs Assistance Bed Mobility: Supine to Sit, Sit to Supine     Supine to sit: Mod assist Sit to supine: Min assist        Transfers Overall transfer level: Needs assistance Equipment used: 2 person hand held assist Transfers: Sit to/from Stand, Bed to chair/wheelchair/BSC Sit to Stand: Min assist, +2 physical assistance, +2 safety/equipment   Step pivot  transfers: Min assist, +2 physical assistance, +2 safety/equipment       General transfer comment: +2 for safety due to cognitive impairments. Assist for balance and constant cueing. Transferred to [facility] with successful liquid BM    Ambulation/Gait                   Stairs             Wheelchair Mobility     Tilt Bed    Modified Rankin (Stroke Patients Only)       Balance Overall balance assessment: Needs assistance Sitting-balance support: Bilateral upper extremity supported, Feet supported Sitting balance-Leahy Scale: Fair     Standing balance support: Bilateral upper extremity supported, During functional activity, Reliant on assistive device for balance Standing balance-Leahy Scale: Poor                              Communication Communication Communication: Impaired Factors Affecting Communication: Difficulty expressing self  Cognition Arousal: Alert Behavior During Therapy: Flat affect   PT - Cognitive impairments: History of cognitive impairments                         Following commands: Impaired      Cueing Cueing Techniques: Verbal cues, Tactile cues, Visual cues  Exercises      General Comments        Pertinent Vitals/Pain Pain Assessment Pain Assessment: PAINAD Breathing: normal Negative Vocalization: none Facial Expression: smiling or inexpressive Body Language: relaxed Consolability: no need to console PAINAD Score: 0 Pain Intervention(s): Monitored during session    Home Living  Prior Function            PT Goals (current goals can now be found in the care plan section) Acute Rehab PT Goals Patient Stated Goal: Pt unable to participate in goal setting d/t cognition PT Goal Formulation: Patient unable to participate in goal setting Time For Goal Achievement: 04/21/24 Potential to Achieve Goals: Fair Progress towards PT goals: Progressing toward goals     Frequency    Min 2X/week      PT Plan      Co-evaluation              AM-PAC PT 6 Clicks Mobility   Outcome Measure  Help needed turning from your back to your side while in a flat bed without using bedrails?: A Little Help needed moving from lying on your back to sitting on the side of a flat bed without using bedrails?: A Lot Help needed moving to and from a bed to a chair (including a wheelchair)?: Total Help needed standing up from a chair using your arms (e.g., wheelchair or bedside chair)?: Total Help needed to walk in hospital room?: Total Help needed climbing 3-5 steps with a railing? : Total 6 Click Score: 9    End of Session   Activity Tolerance: Patient tolerated treatment well Patient left: in bed;with call bell/phone within reach;with bed alarm set;with family/visitor present Nurse Communication: Mobility status PT Visit Diagnosis: Unsteadiness on feet (R26.81);Muscle weakness (generalized) (M62.81);History of falling (Z91.81);Repeated falls (R29.6);Difficulty in walking, not elsewhere classified (R26.2)     Time: 8649-8585 PT Time Calculation (min) (ACUTE ONLY): 24 min  Charges:    $Therapeutic Activity: 8-22 mins PT General Charges $$ ACUTE PT VISIT: 1 Visit                     Maryanne Finder, PT, DPT Physical Therapist - [facility]  [facility]    Quinlin Conant A Spiro Ausborn 04/12/2024, 2:37 PM

## 2024-04-12 NOTE — Care Management Important Message (Signed)
 Important Message  Patient Details  Name: Bob Ortiz MRN: [id number] Date of Birth: 05/17/1941   Important Message Given:  Yes - Medicare IM     Bob Ortiz 04/12/2024, 1:30 PM

## 2024-04-12 NOTE — Progress Notes (Addendum)
 Palliative Care Progress Note, Assessment & Plan   Patient Name: Bob Ortiz       Date: 04/12/2024 DOB: April 21, 1941  Age: 83 y.o. MRN#: [id number] Attending Physician: Jens Durand, MD Primary Care Physician: Salli Amato, MD Admit Date: 04/06/2024  Subjective: Patient is sitting up in bed, awake and alert.  He acknowledges my presence.  He does not engage in discussions during the entirety of my visit.  His daughter is at bedside during my visit.  HPI: 83 y.o. male  with past medical history significant for paroxysmal Afib s/p pacemaker, NICM, combined systolic and diastolic CHF, Addison's disease, hypothyroidism and LBBB. Patient presented to ED 04/06/2024 from home via EMS c/o generalized weakness, falls and inability to ambulate. On arrival to ED, pt was unable to provide history. Daughter and wife report patient had fall the day prior with concern for seizure since he bit his tongue. Pt was found to be hypoglycemic. Family also share patient has experienced significant weight loss over the past several weeks. Per chart, he was being evaluated by neurology at [facility] for bradykinesia and Lewy Body dementia.    ED workup found Na+ 134, glucose 62-->85, creatinine 0.55, calcium 8.2, alk phos 143, albumin 3.0, AST 54, t bili 1.3, BNP 215.8, and troponin 25-->28. WBC 5.2, Hgb 12.4. CT head revealed no acute intracranial abnormalities. 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  ED vitals 106/72, HR 70, RR 18, SpO2 99% and 97.6   TRH was consulted for admission and management of hypoglycemia, falls and weight loss.    Palliative was consulted for assistance with goals of conversations.   Summary of counseling/coordination of  care: Extensive chart review completed prior to meeting patient including labs, vital signs, imaging, progress notes, orders, and available advanced directive documents from current and previous encounters.   After reviewing the patient's chart and assessing the patient at bedside, I spoke with patient and his daughter in regards to symptom management and goals of care.   Symptoms assessed.  Patient is unable to participate in complete system assessment.  However, daughter endorses that she has seen no signs of pain such as grimacing, brow furrowing, or other signs of distress.  She notes that he sometimes grinds his teeth but that this is brief and intermittent.  No adjustment to [facility] needed at this time.  Discussed plan and next steps with patient and his daughter.  Daughter shares they plan to continue to monitor patient until family is able to be home to take care of him.  She has several family members that plan to be in town this weekend.  Therefore, patient does not have a safe discharge plan.  Additionally, discussed cognitive, nutritional, and functional status is significant indicators of patient's overall prognosis.  Patient's p.o. intake has declined and is requiring others to feed him at this time.  Patient has little to no meaningful interaction in everyday conversation.  She shares this is new for him and is hopeful that it improves.  Lastly, I shared concern with patient's functional status.  Daughter shares that he is too much for her mother to take care of  at this time.  However, she and family plan to be by his side at home to help keep him as mobile as possible.  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  Reviewed patient's medical status including acute issues as well as chronic/ongoing comorbidities.  She recalls patient having several falls that he could not stand up or recover from at home, thus prompting the phone call to have EMS take him to  the hospital.  She shares that he has not been himself during this hospitalization and she is hopeful that some of his personality can return once he is home and in a familiar environment.  Discussed that plan remains to continue to monitor the patient.  No change to plan of care at this time.  Watchful waiting to continue.  PMT will continue to follow and support.  Physical Exam Vitals reviewed.  Constitutional:      General: He is not in acute distress.    Appearance: He is normal weight.  HENT:     Head: Normocephalic.     Mouth/Throat:     Mouth: Mucous membranes are moist.  Eyes:     Pupils: Pupils are equal, round, and reactive to light.  Pulmonary:     Effort: Pulmonary effort is normal.  Abdominal:     Palpations: Abdomen is soft.  Neurological:     Mental Status: He is alert.     Comments: Oriented to self             Time spent includes: Detailed review of medical records (labs, imaging, vital signs), medically appropriate exam (mental status, respiratory, cardiac, skin), discussed with treatment team, counseling and educating patient, family and staff, documenting clinical information, medication management and coordination of care.  Lamarr L. Arvid, DNP, FNP-BC Palliative Medicine Team

## 2024-04-12 NOTE — Progress Notes (Signed)
 Occupational Therapy Treatment Patient Details Name: Bob Ortiz MRN: [id number] DOB: 1941-02-07 Today's Date: 04/12/2024   History of present illness Bob Ortiz is an 83 y.o. caucasian male with medical history significant for paroxysmal atrial fibrillation status post pacemaker, nonischemic cardiomyopathy, combined systolic and diastolic CHF, Addison's disease, hypothyroidism and left bundle blanch block who presented to the emergency room with acute onset of generalized weakness and subsequent fall with inability to ambulate. MD assessment includes falls, AMS, and hyponatremia.   OT comments  Upon entering the room, pt supine in bed with daughter present having just finished assisting pt with feeding himself lunch. Pt's spouse also present in room during session. Pt oriented to self only and internally and externally distracted. He believed us  to be stranded in the middle of a road and about to get hit by a car. Pt needing redirection often. Pt unable to verbalize need for toileting but an increase in gastric sounds with mobility and pt assisted to [facility] with min A of 2. Pt have loose BM and needing max A for hygiene and clothing management. Pt needing Min A of 2 to return to supine at end of session. Mod - max multimodal cuing to attend to tasks secondary to cognition. Call bell and all needed items within reach. Bed alarm activated.       If plan is discharge home, recommend the following:  A lot of help with walking and/or transfers;A lot of help with bathing/dressing/bathroom;Assistance with feeding;Assist for transportation;Direct supervision/assist for medications management;Help with stairs or ramp for entrance;Assistance with cooking/housework;Direct supervision/assist for financial management;Supervision due to cognitive status   Equipment Recommendations  Other (comment) (defer to next venue of care)       Precautions / Restrictions Precautions Precautions: Fall Recall of  Precautions/Restrictions: Impaired       Mobility Bed Mobility Overal bed mobility: Needs Assistance Bed Mobility: Supine to Sit, Sit to Supine     Supine to sit: Mod assist Sit to supine: Min assist        Transfers Overall transfer level: Needs assistance Equipment used: 2 person hand held assist Transfers: Sit to/from Stand, Bed to chair/wheelchair/BSC Sit to Stand: Min assist, +2 physical assistance, +2 safety/equipment     Step pivot transfers: Min assist, +2 physical assistance, +2 safety/equipment           Balance Overall balance assessment: Needs assistance Sitting-balance support: Bilateral upper extremity supported, Feet supported Sitting balance-Leahy Scale: Fair     Standing balance support: Bilateral upper extremity supported, During functional activity, Reliant on assistive device for balance Standing balance-Leahy Scale: Poor                             ADL either performed or assessed with clinical judgement   ADL Overall ADL's : Needs assistance/impaired                         Toilet Transfer: Minimal assistance;+2 for safety/equipment;+2 for physical assistance   Toileting- Clothing Manipulation and Hygiene: Maximal assistance;Sit to/from stand              Extremity/Trunk Assessment Upper Extremity Assessment Upper Extremity Assessment: Generalized weakness   Lower Extremity Assessment Lower Extremity Assessment: Generalized weakness        Vision Patient Visual Report: No change from baseline           Communication Communication Communication: Impaired Factors Affecting Communication: Difficulty  expressing self   Cognition Arousal: Alert Behavior During Therapy: Flat affect Cognition: History of cognitive impairments             OT - Cognition Comments: Oriented to self only                 Following commands: Impaired Following commands impaired: Follows one step commands  inconsistently      Cueing   Cueing Techniques: Verbal cues, Tactile cues, Visual cues             Pertinent Vitals/ Pain       Pain Assessment Pain Assessment: PAINAD Breathing: normal Negative Vocalization: none Facial Expression: smiling or inexpressive Body Language: relaxed Consolability: no need to console PAINAD Score: 0 Pain Intervention(s): Monitored during session         Frequency  Min 1X/week        Progress Toward Goals  OT Goals(current goals can now be found in the care plan section)  Progress towards OT goals: Progressing toward goals         Co-evaluation    PT/OT/SLP Co-Evaluation/Treatment: Yes Reason for Co-Treatment: Necessary to address cognition/behavior during functional activity;Complexity of the patient's impairments (multi-system involvement);To address functional/ADL transfers PT goals addressed during session: Mobility/safety with mobility OT goals addressed during session: ADL's and self-care      AM-PAC OT 6 Clicks Daily Activity     Outcome Measure   Help from another person eating meals?: A Lot Help from another person taking care of personal grooming?: A Lot Help from another person toileting, which includes using toliet, bedpan, or urinal?: A Lot Help from another person bathing (including washing, rinsing, drying)?: A Lot Help from another person to put on and taking off regular upper body clothing?: A Lot Help from another person to put on and taking off regular lower body clothing?: A Lot 6 Click Score: 12    End of Session    OT Visit Diagnosis: Unsteadiness on feet (R26.81);Other abnormalities of gait and mobility (R26.89);Repeated falls (R29.6);Muscle weakness (generalized) (M62.81);Feeding difficulties (R63.3);Other symptoms and signs involving cognitive function   Activity Tolerance Patient tolerated treatment well   Patient Left in bed;with family/visitor present;with call bell/phone within reach;with bed  alarm set   Nurse Communication Mobility status        Time: 1351-1414 OT Time Calculation (min): 23 min  Charges: OT General Charges $OT Visit: 1 Visit OT Treatments $Self Care/Home Management : 8-22 mins  Izetta Claude, MS, OTR/L , CBIS ascom [phone]  04/12/24, 3:40 PM

## 2024-04-12 NOTE — Progress Notes (Addendum)
 Progress Note    RAYAAN GARGUILO  FMW:[id number] DOB: 1941-01-15  DOA: 04/06/2024 PCP: Salli Amato, MD      Brief Narrative:    Medical records reviewed and are as summarized below:  [name] is a 83 y.o. male  with medical history significant for paroxysmal atrial fibrillation status post pacemaker, nonischemic cardiomyopathy, combined systolic and diastolic CHF, Addison's disease, hypothyroidism and left bundle blanch block, who presented to the hospital with generalized weakness, falls and inability to ambulate.  According to his daughter, patient fell the day prior to admission and on the day of admission.  Daughter suspected he might have had a seizure because he had bitten his tongue.  Glucose level was slightly low (62) in the ED    Assessment/Plan:   Principal Problem:   Hypoglycemia Active Problems:   Atrial fibrillation, chronic (HCC)   Essential hypertension   GERD without esophagitis   Hyponatremia    Body mass index is 22.2 kg/m.   Hyponatremia: Sodium is still low and only went from 126-127.  Discontinue IV fluids to avoid fluid overload. Suspect hyponatremia is probably multifactorial with contribution from adrenal insufficiency CHF, poor oral intake.   Combined systolic and diastolic CHF: Compensated.  Torsemide and Jardiance on hold. 2D echo in May 2024 showed EF estimated at less than 20%, grade 3 diastolic dysfunction.   Paroxysmal atrial fibrillation: Continue Eliquis  and Coreg    Hypoglycemia, poor oral intake and unintentional weight loss: Encourage adequate oral intake.   History of adrenal insufficiency: Continue prednisone .  S/p treatment with IV hydrocortisone .  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  He was subsequently transition to prednisone .  However, daughter is not sure that patient has been taking his medicines as  prescribed.   Lewy body dementia, intermittent agitation: Continue Seroquel  and supportive care Frequent falls, general weakness: PT recommended discharge to SNF.   Comorbidities include GERD, hypertension, abnormal thyroid function test, normocytic anemia    Diet Order             Diet regular Room service appropriate? Yes; Fluid consistency: Thin  Diet effective now                                  Consultants: Palliative care and hospice  Procedures: None    Medications:    apixaban   5 mg Oral BID   carvedilol   3.125 mg Oral BID WC   cholecalciferol   1,000 Units Oral Daily   cyanocobalamin   1,000 mcg Oral Daily   feeding supplement  237 mL Oral BID BM   magnesium  oxide  800 mg Oral Daily   predniSONE   5 mg Oral Q breakfast   QUEtiapine   12.5 mg Oral QHS   Continuous Infusions:   Anti-infectives (From admission, onward)    None              Family Communication/Anticipated D/C date and plan/Code Status   DVT prophylaxis:  apixaban  (ELIQUIS ) tablet 5 mg     Code Status: Limited: Do not attempt resuscitation (DNR) -DNR-LIMITED -Do Not Intubate/DNI   Family Communication: Plan discussed with his wife at the bedside Disposition Plan: Plan to discharge to SNF versus home with hospice   Status is: Inpatient Remains inpatient appropriate because: No safe disposition plan       Subjective:  Interval events noted.  He is confused and cannot provide any history.  His wife was at the bedside.  His wife said patient has been intermittently agitated  Objective:    Vitals:   04/12/24 0411 04/12/24 0821 04/12/24 1446 04/12/24 1529  BP: 108/70 136/71 111/73 115/71  Pulse: 70 70 71 72  Resp: 20 18 17    Temp: (!) 97.5 F (36.4 C) 98.2 F (36.8 C) 97.9 F (36.6 C)   TempSrc: Oral Oral Oral   SpO2: 98% 98% 100% 99%  Weight:       No data found.   Intake/Output Summary (Last 24 hours) at 04/12/2024 1631 Last data filed  at 04/12/2024 1445 Gross per 24 hour  Intake 480 ml  Output 400 ml  Net 80 ml   Filed Weights   04/06/24 1935  Weight: 68.2 kg    Exam:  GEN: NAD SKIN: Warm and dry EYES: No pallor or icterus ENT: MMM CV: RRR PULM: CTA B ABD: soft, ND, NT, +BS CNS: AAO x 1 (person), non focal EXT: No edema or tenderness        Data Reviewed:   I have personally reviewed following labs and imaging studies:  Labs: Labs show the following:   Basic Metabolic Panel: Recent Labs  Lab 04/06/24 2014 04/07/24 0524 04/08/24 0451 04/09/24 0853 04/10/24 0354 04/11/24 0940  NA 134* 133* 128* 128* 126* 127*  K 4.0 3.8 3.9 4.1 4.4 3.6  CL 100 99 96* 96* 95* 96*  CO2 22 28 25 24 25 23   GLUCOSE 62* 85 73 74 80 97  BUN 14 14 12 15 17 21   CREATININE 0.55* 0.72 0.71 0.78 0.88 0.71  CALCIUM 8.2* 8.1* 7.9* 8.2* 8.1* 8.3*  MG 1.9  --   --  2.0 1.9  --    GFR Estimated Creatinine Clearance: 68.7 mL/min (by C-G formula based on SCr of 0.71 mg/dL). Liver Function Tests: Recent Labs  Lab 04/06/24 2014  AST 54*  ALT 27  ALKPHOS 143*  BILITOT 1.3*  PROT 6.7  ALBUMIN 3.0*   No results for input(s): LIPASE, AMYLASE in the last 168 hours. No results for input(s): AMMONIA in the last 168 hours. Coagulation profile No results for input(s): INR, PROTIME in the last 168 hours.  CBC: Recent Labs  Lab 04/06/24 2014 04/07/24 0524 04/08/24 0451 04/09/24 0853  WBC 5.2 5.4 5.6 5.3  NEUTROABS 3.7  --   --   --   HGB 12.4* 11.1* 10.6* 11.5*  HCT 37.4* 33.2* 30.9* 33.5*  MCV 86.6 85.8 84.2 83.5  PLT 174 172 157 190   Cardiac Enzymes: No results for input(s): CKTOTAL, CKMB, CKMBINDEX, TROPONINI in the last 168 hours. BNP (last 3 results) No results for input(s): PROBNP in the last 8760 hours. CBG: Recent Labs  Lab 04/07/24 0120 04/10/24 1731 04/11/24 0027 04/11/24 0623 04/11/24 1225  GLUCAP 134* 144* 133* 109* 97   D-Dimer: No results for input(s): DDIMER in  the last 72 hours. Hgb A1c: No results for input(s): HGBA1C in the last 72 hours. Lipid Profile: No results for input(s): CHOL, HDL, LDLCALC, TRIG, CHOLHDL, LDLDIRECT in the last 72 hours. Thyroid function studies: Recent Labs    04/10/24 0354  TSH 2.680   Anemia work up: Recent Labs    04/10/24 0354  VITAMINB12 584   Sepsis Labs: Recent Labs  Lab 04/06/24 2014 04/07/24 0524 04/08/24 0451 04/09/24 0853  WBC 5.2 5.4 5.6 5.3    Microbiology Recent Results (from  the past 240 hours)  Resp panel by RT-PCR (RSV, Flu A&B, Covid) Anterior Nasal Swab     Status: None   Collection Time: 04/06/24  8:14 PM   Specimen: Anterior Nasal Swab  Result Value Ref Range Status   SARS Coronavirus 2 by RT PCR NEGATIVE NEGATIVE Final    Comment: (NOTE) SARS-CoV-2 target nucleic acids are NOT DETECTED.  The SARS-CoV-2 RNA is generally detectable in upper respiratory specimens during the acute phase of infection. The lowest concentration of SARS-CoV-2 viral copies this assay can detect is 138 copies/mL. A negative result does not preclude SARS-Cov-2 infection and should not be used as the sole basis for treatment or other patient management decisions. A negative result may occur with  improper specimen collection/handling, submission of specimen other than nasopharyngeal swab, presence of viral mutation(s) within the areas targeted by this assay, and inadequate number of viral copies(<138 copies/mL). A negative result must be combined with clinical observations, patient history, and epidemiological information. The expected result is Negative.  Fact Sheet for Patients:  BloggerCourse.com  Fact Sheet for Healthcare Providers:  SeriousBroker.it  This test is no t yet approved or cleared by the [place]  FDA and  has been authorized for detection and/or diagnosis of SARS-CoV-2 by FDA under an Emergency Use Authorization  (EUA). This EUA will remain  in effect (meaning this test can be used) for the duration of the COVID-19 declaration under Section 564(b)(1) of the Act, 21 U.S.C.section 360bbb-3(b)(1), unless the authorization is terminated  or revoked sooner.       Influenza A by PCR NEGATIVE NEGATIVE Final   Influenza B by PCR NEGATIVE NEGATIVE Final    Comment: (NOTE) The Xpert Xpress SARS-CoV-2/FLU/RSV plus assay is intended as an aid in the diagnosis of influenza from Nasopharyngeal swab specimens and should not be used as a sole basis for treatment. Nasal washings and aspirates are unacceptable for Xpert Xpress SARS-CoV-2/FLU/RSV testing.  Fact Sheet for Patients: BloggerCourse.com  Fact Sheet for Healthcare Providers: SeriousBroker.it  This test is not yet approved or cleared by the [place]  FDA and has been authorized for detection and/or diagnosis of SARS-CoV-2 by FDA under an Emergency Use Authorization (EUA). This EUA will remain in effect (meaning this test can be used) for the duration of the COVID-19 declaration under Section 564(b)(1) of the Act, 21 U.S.C. section 360bbb-3(b)(1), unless the authorization is terminated or revoked.     Resp Syncytial Virus by PCR NEGATIVE NEGATIVE Final    Comment: (NOTE) Fact Sheet for Patients: BloggerCourse.com  Fact Sheet for Healthcare Providers: SeriousBroker.it  This test is not yet approved or cleared by the [place]  FDA and has been authorized for detection and/or diagnosis of SARS-CoV-2 by FDA under an Emergency Use Authorization (EUA). This EUA will remain in effect (meaning this test can be used) for the duration of the COVID-19 declaration under Section 564(b)(1) of the Act, 21 U.S.C. section 360bbb-3(b)(1), unless the authorization is terminated or revoked.  Performed at [facility], [address]  [place], [place] [address]     Procedures and diagnostic studies:  No results found.             LOS: 2 days   Claris Pech  [facility]   Pager on [email or web address]. If 7PM-7AM, please contact night-coverage at [email or web address]     04/12/2024, 4:31 PM

## 2024-04-12 NOTE — TOC Progression Note (Addendum)
 Transition of Care [facility]) - Progression Note    Patient Details  Name: Bob Ortiz MRN: [id number] Date of Birth: 04/11/1941  [facility] CM/SW Contact  Corean ONEIDA Haddock, RN Phone Number: 04/12/2024, 2:50 PM  Clinical Narrative:      Met with patient, daughter and wife at bedside.  Per MD home with hospice services is not a safe discharge plan at this time  IVF have been started Per daughter she states the plan is if he improves with IVF to determine if he is a candidate for SNF Vs if his condition worsens to determine if patient is appropriate for inpatient hospice Daughter states that if plan is for a home discharge patient would not be able to return until next week when additional family support is in the home                    Expected Discharge Plan and Services                                               Social Drivers of Health (SDOH) Interventions SDOH Screenings   Food Insecurity: No Food Insecurity (04/07/2024)  Housing: Low Risk  (04/07/2024)  Transportation Needs: No Transportation Needs (04/07/2024)  Utilities: Not At Risk (04/07/2024)  Financial Resource Strain: Low Risk  (02/08/2024)   Received from [facility]  Physical Activity: Sufficiently Active (11/01/2023)   Received from [facility]  Social Connections: Moderately Isolated (04/07/2024)  Tobacco Use: Medium Risk (04/10/2024)    Readmission Risk Interventions     No data to display

## 2024-04-13 DIAGNOSIS — Z515 Encounter for palliative care: Secondary | ICD-10-CM | POA: Diagnosis not present

## 2024-04-13 DIAGNOSIS — E162 Hypoglycemia, unspecified: Secondary | ICD-10-CM | POA: Diagnosis not present

## 2024-04-13 DIAGNOSIS — I482 Chronic atrial fibrillation, unspecified: Secondary | ICD-10-CM | POA: Diagnosis not present

## 2024-04-13 DIAGNOSIS — K219 Gastro-esophageal reflux disease without esophagitis: Secondary | ICD-10-CM | POA: Diagnosis not present

## 2024-04-13 DIAGNOSIS — R531 Weakness: Secondary | ICD-10-CM

## 2024-04-13 LAB — BASIC METABOLIC PANEL WITH GFR
Anion gap: 6 (ref 5–15)
BUN: 9 mg/dL (ref 8–23)
CO2: 26 mmol/L (ref 22–32)
Calcium: 7.8 mg/dL — ABNORMAL LOW (ref 8.9–10.3)
Chloride: 96 mmol/L — ABNORMAL LOW (ref 98–111)
Creatinine, Ser: 0.62 mg/dL (ref 0.61–1.24)
GFR, Estimated: >60 mL/min (ref >60)
Glucose, Bld: 93 mg/dL (ref 70–99)
Potassium: 3.5 mmol/L (ref 3.5–5.1)
Sodium: 128 mmol/L — ABNORMAL LOW (ref 135–145)

## 2024-04-13 MED ORDER — ORAL CARE MOUTH RINSE: 15.0000 mL | OROMUCOSAL | Status: DC | PRN

## 2024-04-13 NOTE — Progress Notes (Signed)
 Palliative Care Progress Note, Assessment & Plan   Patient Name: Bob Ortiz       Date: 04/13/2024 DOB: 12/04/1940  Age: 83 y.o. MRN#: [id number] Attending Physician: Jens Durand, MD Primary Care Physician: Salli Amato, MD Admit Date: 04/06/2024  Subjective: Patient is sitting up in bed, awake and alert.  He acknowledges my presence.  He is makes some comments during our discussion but they are nonlinear difficult to understand.  Patient's wife is at bedside during my visit.  HPI: 83 y.o. male  with past medical history significant for paroxysmal Afib s/p pacemaker, NICM, combined systolic and diastolic CHF, Addison's disease, hypothyroidism and LBBB. Patient presented to ED 04/06/2024 from home via EMS c/o generalized weakness, falls and inability to ambulate. On arrival to ED, pt was unable to provide history. Daughter and wife report patient had fall the day prior with concern for seizure since he bit his tongue. Pt was found to be hypoglycemic. Family also share patient has experienced significant weight loss over the past several weeks. Per chart, he was being evaluated by neurology at [facility] for bradykinesia and Lewy Body dementia.    ED workup found Na+ 134, glucose 62-->85, creatinine 0.55, calcium 8.2, alk phos 143, albumin 3.0, AST 54, t bili 1.3, BNP 215.8, and troponin 25-->28. WBC 5.2, Hgb 12.4. CT head revealed no acute intracranial abnormalities. 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  ED vitals 106/72, HR 70, RR 18, SpO2 99% and 97.6   TRH was consulted for admission and management of hypoglycemia, falls and weight loss.    Palliative was consulted for assistance with goals of conversations.  Summary  of counseling/coordination of care: Extensive chart review completed prior to meeting patient including labs, vital signs, imaging, progress notes, orders, and available advanced directive documents from current and previous encounters.   After reviewing the patient's chart and assessing the patient at bedside, I spoke with patient in regards to symptom management and goals of care.   Attempted to assess symptoms.  Patient unable to engage in symptom assessment.  Nonverbal signs of distress, pain not noted such as brow furling, grimacing, fidgeting, or moaning.  No adjustment to [facility] needed at this time.  I attempted to elicit values and goals important to patient and wife.  Wife shares she is hopeful patient can be transferred to a palliative care facility.  Advised her that patient does not fit within criteria of hospice inpatient home at this time.  Additionally, the palliative unit does not exist in this hospital.  Reviewed her options include patient transferring to SNF for rehab, long-term care, or returning home with home health.  She shares disappointment but understanding that these are her options.  Discussed TOC is following closely for discharge support and planning.  Discussed that patient's cognitive status is a significant contributor to his overall functional and nutritional status.  She shares she has had to start feeding him and that he is unable to perform ADLs independently.  She is very concerned that this is his new baseline.  Discussed Lewy body dementia as a chronic, progressive, and irreversible disease that is often exacerbated  by acute hospitalizations and illnesses.  She shares understanding.  She becomes tearful.  Therapeutic silence, active listening, and emotional support provided.  She shares that family members will be coming from out of town to help take care of the patient.  However, she is unsure when they will be in town and is unsure if this is enough to provide the  needed care for him at home.  She shares she is between a rock and a hard place.  Briefly discussed hospice services and outpatient palliative services at home to follow.  She shares she needs more time to consider what the next best step for him will be.  PMT will continue to follow and support.  No change to plan of care at this time.  Physical Exam Vitals reviewed.  Constitutional:      General: He is not in acute distress.    Appearance: He is normal weight. He is not ill-appearing.  HENT:     Head: Normocephalic.     Mouth/Throat:     Mouth: Mucous membranes are moist.  Eyes:     Pupils: Pupils are equal, round, and reactive to light.  Cardiovascular:     Pulses: Normal pulses.  Pulmonary:     Effort: Pulmonary effort is normal.  Abdominal:     Palpations: Abdomen is soft.  Skin:    General: Skin is warm and dry.  Neurological:     Mental Status: He is alert.  Psychiatric:        Mood and Affect: Mood normal.             Time spent includes: Detailed review of medical records (labs, imaging, vital signs), medically appropriate exam (mental status, respiratory, cardiac, skin), discussed with treatment team, counseling and educating patient, family and staff, documenting clinical information, medication management and coordination of care.  Lamarr L. Arvid, DNP, FNP-BC Palliative Medicine Team

## 2024-04-13 NOTE — NC FL2 (Signed)
 [place]  MEDICAID [id number] LEVEL OF CARE FORM     IDENTIFICATION  Patient Name: Bob Ortiz Birthdate: 1941-06-28 Sex: male Admission Date (Current Location): 04/06/2024  [facility] and [place] Number:  Chiropodist and Address:  [facility], [address], [place], [place] [address]      Provider Number: [id number]  Attending Physician Name and Address:  Jens Durand, MD  Relative Name and Phone Number:  Merrit Friesen [phone]    Current Level of Care: Hospital Recommended Level of Care: Skilled Nursing Facility Prior Approval Number:    Date Approved/Denied:   PASRR Number: [id number]  Discharge Plan: SNF    Current Diagnoses: Patient Active Problem List   Diagnosis Date Noted   Hyponatremia 04/10/2024   GERD without esophagitis 04/07/2024   Hypoglycemia 04/06/2024   Acute on chronic congestive heart failure (HCC) 12/21/2023   Episode of shaking 12/21/2023   Seizure (HCC) 12/13/2023   Bilateral pleural effusion 12/13/2023   Elevated troponin 12/13/2023   Atrial fibrillation, chronic (HCC) 12/13/2023   Essential hypertension 12/13/2023   Transaminitis 12/13/2023   NICM (nonischemic cardiomyopathy) (HCC) 12/13/2023    Orientation RESPIRATION BLADDER Height & Weight     Self  Normal Incontinent Weight: 68.2 kg Height:     BEHAVIORAL SYMPTOMS/MOOD NEUROLOGICAL BOWEL NUTRITION STATUS     (LB DEMENTIA) Incontinent Diet (regular)  AMBULATORY STATUS COMMUNICATION OF NEEDS Skin   Extensive Assist Verbally Bruising                       Personal Care Assistance Level of Assistance  Total care       Total Care Assistance: Limited assistance   Functional Limitations Info  Sight, Hearing, Speech Sight Info: Adequate Hearing Info: Impaired      SPECIAL CARE FACTORS FREQUENCY  PT (By licensed PT), OT (By licensed OT)                    Contractures Contractures Info: Not present    Additional Factors Info   Code Status Code Status Info: DNR limited Allergies Info: Quinapril; Bumetanide ; Spironolactone; Torsemide           Current Medications (04/13/2024):  This is the current hospital active medication list Current Facility-Administered Medications  Medication Dose Route Frequency Provider Last Rate Last Admin   acetaminophen  (TYLENOL ) tablet 650 mg  650 mg Oral Q6H PRN Mansy, Jan A, MD       Or   acetaminophen  (TYLENOL ) suppository 650 mg  650 mg Rectal Q6H PRN Mansy, Jan A, MD       apixaban  (ELIQUIS ) tablet 5 mg  5 mg Oral BID Mansy, Jan A, MD   5 mg at 04/13/24 0830   carvedilol  (COREG ) tablet 3.125 mg  3.125 mg Oral BID WC Franchot Novel, MD   3.125 mg at 04/13/24 0830   cholecalciferol  (VITAMIN D3) 25 MCG (1000 UNIT) tablet 1,000 Units  1,000 Units Oral Daily Mansy, Jan A, MD   1,000 Units at 04/13/24 0830   cyanocobalamin  (VITAMIN B12) tablet 1,000 mcg  1,000 mcg Oral Daily Mansy, Jan A, MD   1,000 mcg at 04/13/24 0830   ethacrynic  acid (EDECRIN ) tablet 50 mg  50 mg Oral Daily PRN Mansy, Jan A, MD       feeding supplement (ENSURE PLUS HIGH PROTEIN) liquid 237 mL  237 mL Oral BID BM Fausto Sor A, DO   237 mL at 04/11/24 1107   loperamide  (IMODIUM )  capsule 2 mg  2 mg Oral PRN Fausto Sor A, DO   2 mg at 04/11/24 2138   magnesium  hydroxide (MILK OF MAGNESIA) suspension 30 mL  30 mL Oral Daily PRN Mansy, Jan A, MD       magnesium  oxide (MAG-OX) tablet 800 mg  800 mg Oral Daily Fausto Sor A, DO   800 mg at 04/13/24 0830   ondansetron  (ZOFRAN ) tablet 4 mg  4 mg Oral Q6H PRN Mansy, Jan A, MD       Or   ondansetron  (ZOFRAN ) injection 4 mg  4 mg Intravenous Q6H PRN Mansy, Madison LABOR, MD       Oral care mouth rinse  15 mL Mouth Rinse PRN Jens Durand, MD       predniSONE  (DELTASONE ) tablet 5 mg  5 mg Oral Q breakfast Fausto Sor A, DO   5 mg at 04/13/24 0830   QUEtiapine  (SEROQUEL ) tablet 12.5 mg  12.5 mg Oral QHS Franchot Novel, MD   12.5 mg at 04/12/24 2116   traZODone   (DESYREL ) tablet 25 mg  25 mg Oral QHS PRN Mansy, Jan A, MD   25 mg at 04/08/24 2138     Discharge Medications: Please see discharge summary for a list of discharge medications.  Relevant Imaging Results:  Relevant Lab Results:   Additional Information [id number]  Corean ONEIDA Haddock, RN

## 2024-04-13 NOTE — Progress Notes (Signed)
 [facility] Room 213 [facility] Hospice Liaison note  [facility] initiating SNF rehab bed search.  Outpatient palliative referral submitted for follow up at the facility when a bed is found.    Please call with any palliative related questions or concerns.  Thank you for the opportunity to participate in this patient's care  [facility] Liaison [phone]

## 2024-04-13 NOTE — TOC Progression Note (Signed)
 Transition of Care [facility]) - Progression Note    Patient Details  Name: Bob Ortiz MRN: [id number] Date of Birth: January 22, 1941  [facility]) CM/SW Contact  Corean ONEIDA Haddock, RN Phone Number: 04/13/2024, 4:15 PM  Clinical Narrative:     Call placed to daughter [name]  Reviewed bed offers.  She is in agreement to current plan for SNF with palliative, and then transition  home to hospice pending insurance auth  She defer Snf decision to patient's wife, however her 1st choice would be [facility]  Met with wife at bedside.  She accepts be at [facility] Accepted in [place] and notified Brittney at [employer] [facility]                                               Social Drivers of Health (SDOH) Interventions SDOH Screenings   Food Insecurity: No Food Insecurity (04/07/2024)  Housing: Low Risk  (04/07/2024)  Transportation Needs: No Transportation Needs (04/07/2024)  Utilities: Not At Risk (04/07/2024)  Financial Resource Strain: Low Risk  (02/08/2024)   Received from [facility]  Physical Activity: Sufficiently Active (11/01/2023)   Received from [facility]  Social Connections: Moderately Isolated (04/07/2024)  Tobacco Use: Medium Risk (04/10/2024)    Readmission Risk Interventions     No data to display

## 2024-04-13 NOTE — TOC Progression Note (Addendum)
 Transition of Care [facility]) - Progression Note    Patient Details  Name: [name] MRN: [id number] Date of Birth: 1941/04/02  Transition of Care [facility]) CM/SW Contact  Corean ONEIDA Haddock, RN Phone Number: 04/13/2024, 10:42 AM  Clinical Narrative:     Late Entry:  04/12/24 Spoke with [name] daughter via phone.   She is in agreement to proceed with SNF bed search if MD confirms patient is medically appropriate - Confirmed with Marinell with [facility] that patient is still not a candidate with inpatient hospice at this time - Confirmed with MD that patient is medically stable for bed search to be initiated   9/11 - updated Fl2 sent for signature PASRR obtained, bed search initiated                    Expected Discharge Plan and Services                                               Social Drivers of Health (SDOH) Interventions SDOH Screenings   Food Insecurity: No Food Insecurity (04/07/2024)  Housing: Low Risk  (04/07/2024)  Transportation Needs: No Transportation Needs (04/07/2024)  Utilities: Not At Risk (04/07/2024)  Financial Resource Strain: Low Risk  (02/08/2024)   Received from [facility]  Physical Activity: Sufficiently Active (11/01/2023)   Received from [facility]  Social Connections: Moderately Isolated (04/07/2024)  Tobacco Use: Medium Risk (04/10/2024)    Readmission Risk Interventions     No data to display

## 2024-04-13 NOTE — Progress Notes (Signed)
 Progress Note    Bob Ortiz  FMW:[id number] DOB: Feb 25, 1941  DOA: 04/06/2024 PCP: Salli Amato, MD      Brief Narrative:    Medical records reviewed and are as summarized below:  Bob Ortiz is a 83 y.o. male  with medical history significant for paroxysmal atrial fibrillation status post pacemaker, nonischemic cardiomyopathy, combined systolic and diastolic CHF, Addison's disease, hypothyroidism and left bundle blanch block, who presented to the hospital with generalized weakness, falls and inability to ambulate.  According to his daughter, patient fell the day prior to admission and on the day of admission.  Daughter suspected he might have had a seizure because he had bitten his tongue.  Glucose level was slightly low (62) in the ED    Assessment/Plan:   Principal Problem:   Hypoglycemia Active Problems:   Atrial fibrillation, chronic (HCC)   Essential hypertension   GERD without esophagitis   Hyponatremia    Body mass index is 22.2 kg/m.   Hyponatremia: Sodium slowly trending upward, from 126-127-128.  No longer on IV fluids. Suspect hyponatremia is probably multifactorial with contribution from adrenal insufficiency CHF, poor oral intake.   Combined systolic and diastolic CHF: Compensated.  Torsemide and Jardiance on hold. 2D echo in May 2024 showed EF estimated at less than 20%, grade 3 diastolic dysfunction.   Paroxysmal atrial fibrillation: Continue Eliquis  and Coreg    Hypoglycemia, poor oral intake and unintentional weight loss: Glucose levels are optimal.  Encourage adequate oral intake.   History of adrenal insufficiency: Continue prednisone .  S/p treatment with IV hydrocortisone .  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  He was subsequently transitioned to prednisone .  However, daughter is not sure that patient has been taking his medicines as  prescribed.   Lewy body dementia, intermittent agitation: Continue Seroquel  and supportive care Frequent falls, general weakness: PT recommended discharge to SNF.   Comorbidities include GERD, hypertension, abnormal thyroid function test, normocytic anemia   Plan discussed with his wife at the bedside.  His wife does not think she can take care of him at home.  Follow-up with TOC to assist with disposition.  Diet Order             Diet regular Room service appropriate? Yes; Fluid consistency: Thin  Diet effective now                                  Consultants: Palliative care and hospice  Procedures: None    Medications:    apixaban   5 mg Oral BID   carvedilol   3.125 mg Oral BID WC   cholecalciferol   1,000 Units Oral Daily   cyanocobalamin   1,000 mcg Oral Daily   feeding supplement  237 mL Oral BID BM   magnesium  oxide  800 mg Oral Daily   predniSONE   5 mg Oral Q breakfast   QUEtiapine   12.5 mg Oral QHS   Continuous Infusions:   Anti-infectives (From admission, onward)    None              Family Communication/Anticipated D/C date and plan/Code Status   DVT prophylaxis:  apixaban  (ELIQUIS ) tablet 5 mg     Code Status: Limited: Do not attempt resuscitation (DNR) -DNR-LIMITED -Do Not Intubate/DNI   Family Communication: Plan discussed with his wife at the bedside Disposition  Plan: Plan to discharge to SNF    Status is: Inpatient Remains inpatient appropriate because: No safe disposition plan       Subjective:   Interval events noted.  He is still confused and unable to provide any history.  Wife at the bedside.  Objective:    Vitals:   04/12/24 2027 04/13/24 0313 04/13/24 0740 04/13/24 1526  BP: 122/76 122/76 126/77 115/81  Pulse: 72 70 71 71  Resp: 16 16 16 18   Temp: 97.6 F (36.4 C) 98.6 F (37 C) 98.3 F (36.8 C) 98.3 F (36.8 C)  TempSrc: Oral   Oral  SpO2: 99% 95% 96% 98%  Weight:       No data  found.   Intake/Output Summary (Last 24 hours) at 04/13/2024 1644 Last data filed at 04/13/2024 0900 Gross per 24 hour  Intake 370 ml  Output 600 ml  Net -230 ml   Filed Weights   04/06/24 1935  Weight: 68.2 kg    Exam:  GEN: NAD SKIN: Warm and dry EYES: Anicteric ENT: MMM CV: RRR PULM: CTA B ABD: soft, ND, NT, +BS CNS: Alert and oriented to person only. EXT: No edema or tenderness      Data Reviewed:   I have personally reviewed following labs and imaging studies:  Labs: Labs show the following:   Basic Metabolic Panel: Recent Labs  Lab 04/06/24 2014 04/07/24 0524 04/08/24 0451 04/09/24 0853 04/10/24 0354 04/11/24 0940 04/13/24 0920  NA 134*   < > 128* 128* 126* 127* 128*  K 4.0   < > 3.9 4.1 4.4 3.6 3.5  CL 100   < > 96* 96* 95* 96* 96*  CO2 22   < > 25 24 25 23 26   GLUCOSE 62*   < > 73 74 80 97 93  BUN 14   < > 12 15 17 21 9   CREATININE 0.55*   < > 0.71 0.78 0.88 0.71 0.62  CALCIUM 8.2*   < > 7.9* 8.2* 8.1* 8.3* 7.8*  MG 1.9  --   --  2.0 1.9  --   --    < > = values in this interval not displayed.   GFR Estimated Creatinine Clearance: 68.7 mL/min (by C-G formula based on SCr of 0.62 mg/dL). Liver Function Tests: Recent Labs  Lab 04/06/24 2014  AST 54*  ALT 27  ALKPHOS 143*  BILITOT 1.3*  PROT 6.7  ALBUMIN 3.0*   No results for input(s): LIPASE, AMYLASE in the last 168 hours. No results for input(s): AMMONIA in the last 168 hours. Coagulation profile No results for input(s): INR, PROTIME in the last 168 hours.  CBC: Recent Labs  Lab 04/06/24 2014 04/07/24 0524 04/08/24 0451 04/09/24 0853  WBC 5.2 5.4 5.6 5.3  NEUTROABS 3.7  --   --   --   HGB 12.4* 11.1* 10.6* 11.5*  HCT 37.4* 33.2* 30.9* 33.5*  MCV 86.6 85.8 84.2 83.5  PLT 174 172 157 190   Cardiac Enzymes: No results for input(s): CKTOTAL, CKMB, CKMBINDEX, TROPONINI in the last 168 hours. BNP (last 3 results) No results for input(s): PROBNP in the last  8760 hours. CBG: Recent Labs  Lab 04/07/24 0120 04/10/24 1731 04/11/24 0027 04/11/24 0623 04/11/24 1225  GLUCAP 134* 144* 133* 109* 97   D-Dimer: No results for input(s): DDIMER in the last 72 hours. Hgb A1c: No results for input(s): HGBA1C in the last 72 hours. Lipid Profile: No results for input(s): CHOL, HDL, LDLCALC,  TRIG, CHOLHDL, LDLDIRECT in the last 72 hours. Thyroid function studies: No results for input(s): TSH, T4TOTAL, T3FREE, THYROIDAB in the last 72 hours.  Invalid input(s): FREET3  Anemia work up: No results for input(s): VITAMINB12, FOLATE, FERRITIN, TIBC, IRON, RETICCTPCT in the last 72 hours.  Sepsis Labs: Recent Labs  Lab 04/06/24 2014 04/07/24 0524 04/08/24 0451 04/09/24 0853  WBC 5.2 5.4 5.6 5.3    Microbiology Recent Results (from the past 240 hours)  Resp panel by RT-PCR (RSV, Flu A&B, Covid) Anterior Nasal Swab     Status: None   Collection Time: 04/06/24  8:14 PM   Specimen: Anterior Nasal Swab  Result Value Ref Range Status   SARS Coronavirus 2 by RT PCR NEGATIVE NEGATIVE Final    Comment: (NOTE) SARS-CoV-2 target nucleic acids are NOT DETECTED.  The SARS-CoV-2 RNA is generally detectable in upper respiratory specimens during the acute phase of infection. The lowest concentration of SARS-CoV-2 viral copies this assay can detect is 138 copies/mL. A negative result does not preclude SARS-Cov-2 infection and should not be used as the sole basis for treatment or other patient management decisions. A negative result may occur with  improper specimen collection/handling, submission of specimen other than nasopharyngeal swab, presence of viral mutation(s) within the areas targeted by this assay, and inadequate number of viral copies(<138 copies/mL). A negative result must be combined with clinical observations, patient history, and epidemiological information. The expected result is Negative.  Fact  Sheet for Patients:  BloggerCourse.com  Fact Sheet for Healthcare Providers:  SeriousBroker.it  This test is no t yet approved or cleared by the [place]  FDA and  has been authorized for detection and/or diagnosis of SARS-CoV-2 by FDA under an Emergency Use Authorization (EUA). This EUA will remain  in effect (meaning this test can be used) for the duration of the COVID-19 declaration under Section 564(b)(1) of the Act, 21 U.S.C.section 360bbb-3(b)(1), unless the authorization is terminated  or revoked sooner.       Influenza A by PCR NEGATIVE NEGATIVE Final   Influenza B by PCR NEGATIVE NEGATIVE Final    Comment: (NOTE) The Xpert Xpress SARS-CoV-2/FLU/RSV plus assay is intended as an aid in the diagnosis of influenza from Nasopharyngeal swab specimens and should not be used as a sole basis for treatment. Nasal washings and aspirates are unacceptable for Xpert Xpress SARS-CoV-2/FLU/RSV testing.  Fact Sheet for Patients: BloggerCourse.com  Fact Sheet for Healthcare Providers: SeriousBroker.it  This test is not yet approved or cleared by the [place]  FDA and has been authorized for detection and/or diagnosis of SARS-CoV-2 by FDA under an Emergency Use Authorization (EUA). This EUA will remain in effect (meaning this test can be used) for the duration of the COVID-19 declaration under Section 564(b)(1) of the Act, 21 U.S.C. section 360bbb-3(b)(1), unless the authorization is terminated or revoked.     Resp Syncytial Virus by PCR NEGATIVE NEGATIVE Final    Comment: (NOTE) Fact Sheet for Patients: BloggerCourse.com  Fact Sheet for Healthcare Providers: SeriousBroker.it  This test is not yet approved or cleared by the [place]  FDA and has been authorized for detection and/or diagnosis of SARS-CoV-2 by FDA under an  Emergency Use Authorization (EUA). This EUA will remain in effect (meaning this test can be used) for the duration of the COVID-19 declaration under Section 564(b)(1) of the Act, 21 U.S.C. section 360bbb-3(b)(1), unless the authorization is terminated or revoked.  Performed at [facility], [address], [place], [place] [address]  Procedures and diagnostic studies:  No results found.             LOS: 3 days   Caylie Sandquist  [facility]   Pager on [email or web address]. If 7PM-7AM, please contact night-coverage at [email or web address]     04/13/2024, 4:44 PM

## 2024-04-14 DIAGNOSIS — Z515 Encounter for palliative care: Secondary | ICD-10-CM | POA: Diagnosis not present

## 2024-04-14 DIAGNOSIS — E162 Hypoglycemia, unspecified: Secondary | ICD-10-CM | POA: Diagnosis not present

## 2024-04-14 DIAGNOSIS — K219 Gastro-esophageal reflux disease without esophagitis: Secondary | ICD-10-CM | POA: Diagnosis not present

## 2024-04-14 DIAGNOSIS — I482 Chronic atrial fibrillation, unspecified: Secondary | ICD-10-CM | POA: Diagnosis not present

## 2024-04-14 NOTE — TOC Progression Note (Addendum)
 Transition of Care [facility]) - Progression Note    Patient Details  Name: Bob Ortiz MRN: [id number] Date of Birth: 11/22/1940  [facility] CM/SW Contact  Corean ONEIDA Haddock, RN Phone Number: 04/14/2024, 10:50 AM  Clinical Narrative:      Per Rojelio with IP Care Management auth pending for [facility]     1151 am - auth still pending     200 pm - auth still pending          Expected Discharge Plan and Services                                               Social Drivers of Health (SDOH) Interventions SDOH Screenings   Food Insecurity: No Food Insecurity (04/07/2024)  Housing: Low Risk  (04/07/2024)  Transportation Needs: No Transportation Needs (04/07/2024)  Utilities: Not At Risk (04/07/2024)  Financial Resource Strain: Low Risk  (02/08/2024)   Received from [facility]  Physical Activity: Sufficiently Active (11/01/2023)   Received from [facility]  Social Connections: Moderately Isolated (04/07/2024)  Tobacco Use: Medium Risk (04/10/2024)    Readmission Risk Interventions     No data to display

## 2024-04-14 NOTE — Progress Notes (Signed)
 Physical Therapy Treatment Patient Details Name: Bob Ortiz MRN: [id number] DOB: June 02, 1941 Today's Date: 04/14/2024   History of Present Illness Bob Ortiz is an 83 y.o. caucasian male with medical history significant for paroxysmal atrial fibrillation status post pacemaker, nonischemic cardiomyopathy, combined systolic and diastolic CHF, Addison's disease, hypothyroidism and left bundle blanch block who presented to the emergency room with acute onset of generalized weakness and subsequent fall with inability to ambulate. MD assessment includes falls, AMS, and hyponatremia.    PT Comments  Patient reclined on arrival and family present and supportive. Able to complete bed mobility with minA. Required minA+2 for sit to stand and step pivot transfer with HHAx2. Ambulated 25' with face to face HHA and +1 therapist behind patient. With fatigue, patient with knees flexed into crouched posture. Patient able to follow 75% of one step commands this date but worsened with fatigue. Discharge plan remains appropriate.     If plan is discharge home, recommend the following: A lot of help with walking and/or transfers;A lot of help with bathing/dressing/bathroom;Direct supervision/assist for medications management;Help with stairs or ramp for entrance;Supervision due to cognitive status   Can travel by private vehicle     No  Equipment Recommendations  BSC/3in1    Recommendations for Other Services       Precautions / Restrictions Precautions Precautions: Fall Recall of Precautions/Restrictions: Impaired Restrictions Weight Bearing Restrictions Per Provider Order: No     Mobility  Bed Mobility Overal bed mobility: Needs Assistance Bed Mobility: Supine to Sit, Sit to Supine     Supine to sit: Min assist Sit to supine: Min assist        Transfers Overall transfer level: Needs assistance Equipment used: 2 person hand held assist Transfers: Sit to/from Stand, Bed to  chair/wheelchair/BSC Sit to Stand: Min assist, +2 physical assistance, +2 safety/equipment   Step pivot transfers: Min assist, +2 physical assistance, +2 safety/equipment       General transfer comment: transferred to [facility] initially to attempt BM    Ambulation/Gait Ambulation/Gait assistance: Min assist, +2 physical assistance, +2 safety/equipment Gait Distance (Feet): 25 Feet Assistive device: 2 person hand held assist Gait Pattern/deviations: Step-to pattern, Knee flexed in stance - right, Knee flexed in stance - left Gait velocity: decreased     General Gait Details: Face to face HHA with +1 behind patient. Assist for balance and directing within room. With fatigue, patient with knees flexed into crouched posture. Cues for upright posture and straightening knees   Stairs             Wheelchair Mobility     Tilt Bed    Modified Rankin (Stroke Patients Only)       Balance Overall balance assessment: Needs assistance Sitting-balance support: Bilateral upper extremity supported, Feet supported Sitting balance-Leahy Scale: Fair     Standing balance support: Bilateral upper extremity supported, During functional activity, Reliant on assistive device for balance Standing balance-Leahy Scale: Poor                              Communication Communication Communication: Impaired Factors Affecting Communication: Difficulty expressing self  Cognition Arousal: Alert Behavior During Therapy: Flat affect   PT - Cognitive impairments: History of cognitive impairments                         Following commands: Impaired Following commands impaired: Follows one step commands inconsistently  Cueing Cueing Techniques: Verbal cues, Tactile cues, Visual cues  Exercises      General Comments        Pertinent Vitals/Pain Pain Assessment Pain Assessment: PAINAD Breathing: normal Negative Vocalization: none Facial Expression: smiling or  inexpressive Body Language: relaxed Consolability: no need to console PAINAD Score: 0 Pain Intervention(s): Monitored during session    Home Living                          Prior Function            PT Goals (current goals can now be found in the care plan section) Acute Rehab PT Goals Patient Stated Goal: Pt unable to participate in goal setting d/t cognition PT Goal Formulation: Patient unable to participate in goal setting Time For Goal Achievement: 04/21/24 Potential to Achieve Goals: Fair Progress towards PT goals: Progressing toward goals    Frequency    Min 2X/week      PT Plan      Co-evaluation              AM-PAC PT 6 Clicks Mobility   Outcome Measure  Help needed turning from your back to your side while in a flat bed without using bedrails?: A Little Help needed moving from lying on your back to sitting on the side of a flat bed without using bedrails?: A Lot Help needed moving to and from a bed to a chair (including a wheelchair)?: Total Help needed standing up from a chair using your arms (e.g., wheelchair or bedside chair)?: Total Help needed to walk in hospital room?: Total Help needed climbing 3-5 steps with a railing? : Total 6 Click Score: 9    End of Session Equipment Utilized During Treatment: Gait belt Activity Tolerance: Patient tolerated treatment well Patient left: in bed;with call bell/phone within reach;with bed alarm set;with family/visitor present Nurse Communication: Mobility status PT Visit Diagnosis: Unsteadiness on feet (R26.81);Muscle weakness (generalized) (M62.81);History of falling (Z91.81);Repeated falls (R29.6);Difficulty in walking, not elsewhere classified (R26.2)     Time: 8884-8858 PT Time Calculation (min) (ACUTE ONLY): 26 min  Charges:    $Therapeutic Activity: 8-22 mins PT General Charges $$ ACUTE PT VISIT: 1 Visit                     Maryanne Finder, PT, DPT Physical Therapist - [facility]   [facility]    Elliot Simoneaux A Laityn Bensen 04/14/2024, 1:15 PM

## 2024-04-14 NOTE — Progress Notes (Signed)
 Progress Note    Bob Ortiz  FMW:[id number] DOB: [birth year]  DOA: 04/06/2024 PCP: Salli Amato, MD      Brief Narrative:    Medical records reviewed and are as summarized below:  Bob Ortiz is a 83 y.o. male  with medical history significant for paroxysmal atrial fibrillation status post pacemaker, nonischemic cardiomyopathy, combined systolic and diastolic CHF, Addison's disease, hypothyroidism and left bundle blanch block, who presented to the hospital with generalized weakness, falls and inability to ambulate.  According to his daughter, patient fell the day prior to admission and on the day of admission.  Daughter suspected he might have had a seizure because he had bitten his tongue.  Glucose level was slightly low (62) in the ED    Assessment/Plan:   Principal Problem:   Hypoglycemia Active Problems:   Atrial fibrillation, chronic (HCC)   Essential hypertension   GERD without esophagitis   Hyponatremia    Body mass index is 22.2 kg/m.   Hyponatremia: Sodium slowly trending upward, from 126-127-128.  No longer on IV fluids. Suspect hyponatremia is probably multifactorial with contribution from adrenal insufficiency CHF, poor oral intake.   Combined systolic and diastolic CHF: Compensated.   He is tolerating room air.  No hypervolemia.  Continue to hold torsemide for now. It appears patient was no longer taking Jardiance prior to admission. 2D echo in May 2024 showed EF estimated at less than 20%, grade 3 diastolic dysfunction.   Paroxysmal atrial fibrillation: Continue Eliquis  and Coreg    Hypoglycemia, poor oral intake and unintentional weight loss: Glucose levels are stable.  Continue to monitor.   History of adrenal insufficiency: Continue prednisone .  S/p treatment with IV hydrocortisone .  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  He was subsequently  transitioned to prednisone .  However, daughter is not sure that patient has been taking his medicines as prescribed.   Lewy body dementia, intermittent agitation: Continue Seroquel  and supportive care Frequent falls, general weakness: PT recommended discharge to SNF.   Comorbidities include GERD, hypertension, abnormal thyroid function test, normocytic anemia   Awaiting placement to SNF   Diet Order             Diet regular Room service appropriate? Yes; Fluid consistency: Thin  Diet effective now                                  Consultants: Palliative care and hospice  Procedures: None    Medications:    apixaban   5 mg Oral BID   carvedilol   3.125 mg Oral BID WC   cholecalciferol   1,000 Units Oral Daily   cyanocobalamin   1,000 mcg Oral Daily   feeding supplement  237 mL Oral BID BM   magnesium  oxide  800 mg Oral Daily   predniSONE   5 mg Oral Q breakfast   QUEtiapine   12.5 mg Oral QHS   Continuous Infusions:   Anti-infectives (From admission, onward)    None              Family Communication/Anticipated D/C date and plan/Code Status   DVT prophylaxis:  apixaban  (ELIQUIS ) tablet 5 mg     Code Status: Limited: Do not attempt resuscitation (DNR) -DNR-LIMITED -Do Not Intubate/DNI   Family Communication: Plan discussed with his wife at the bedside Disposition Plan: Plan to discharge  to SNF    Status is: Inpatient Remains inpatient appropriate because: Awaiting placement to SNF       Subjective:   Patient is confused and unable to provide any history.  Wife at the bedside.  No acute issues reported by wife.  Objective:    Vitals:   04/13/24 1526 04/13/24 2154 04/14/24 0344 04/14/24 0844  BP: 115/81 123/70 115/88 139/74  Pulse: 71 71 70 72  Resp: 18  18 18   Temp: 98.3 F (36.8 C) 98.2 F (36.8 C) 97.6 F (36.4 C) 98.4 F (36.9 C)  TempSrc: Oral  Oral   SpO2: 98% 97% 96% 98%  Weight:       No data  found.   Intake/Output Summary (Last 24 hours) at 04/14/2024 1002 Last data filed at 04/14/2024 0349 Gross per 24 hour  Intake --  Output 1050 ml  Net -1050 ml   Filed Weights   04/06/24 1935  Weight: 68.2 kg    Exam:   GEN: NAD SKIN: Warm and dry EYES: Anicteric ENT: MMM CV: RRR PULM: CTA B ABD: soft, ND, NT, +BS CNS: AAO x 1 (person), confused, nonfocal EXT: No edema or tenderness    Data Reviewed:   I have personally reviewed following labs and imaging studies:  Labs: Labs show the following:   Basic Metabolic Panel: Recent Labs  Lab 04/08/24 0451 04/09/24 0853 04/10/24 0354 04/11/24 0940 04/13/24 0920  NA 128* 128* 126* 127* 128*  K 3.9 4.1 4.4 3.6 3.5  CL 96* 96* 95* 96* 96*  CO2 25 24 25 23 26   GLUCOSE 73 74 80 97 93  BUN 12 15 17 21 9   CREATININE 0.71 0.78 0.88 0.71 0.62  CALCIUM 7.9* 8.2* 8.1* 8.3* 7.8*  MG  --  2.0 1.9  --   --    GFR Estimated Creatinine Clearance: 68.7 mL/min (by C-G formula based on SCr of 0.62 mg/dL). Liver Function Tests: No results for input(s): AST, ALT, ALKPHOS, BILITOT, PROT, ALBUMIN in the last 168 hours.  No results for input(s): LIPASE, AMYLASE in the last 168 hours. No results for input(s): AMMONIA in the last 168 hours. Coagulation profile No results for input(s): INR, PROTIME in the last 168 hours.  CBC: Recent Labs  Lab 04/08/24 0451 04/09/24 0853  WBC 5.6 5.3  HGB 10.6* 11.5*  HCT 30.9* 33.5*  MCV 84.2 83.5  PLT 157 190   Cardiac Enzymes: No results for input(s): CKTOTAL, CKMB, CKMBINDEX, TROPONINI in the last 168 hours. BNP (last 3 results) No results for input(s): PROBNP in the last 8760 hours. CBG: Recent Labs  Lab 04/10/24 1731 04/11/24 0027 04/11/24 0623 04/11/24 1225  GLUCAP 144* 133* 109* 97   D-Dimer: No results for input(s): DDIMER in the last 72 hours. Hgb A1c: No results for input(s): HGBA1C in the last 72 hours. Lipid Profile: No results  for input(s): CHOL, HDL, LDLCALC, TRIG, CHOLHDL, LDLDIRECT in the last 72 hours. Thyroid function studies: No results for input(s): TSH, T4TOTAL, T3FREE, THYROIDAB in the last 72 hours.  Invalid input(s): FREET3  Anemia work up: No results for input(s): VITAMINB12, FOLATE, FERRITIN, TIBC, IRON, RETICCTPCT in the last 72 hours.  Sepsis Labs: Recent Labs  Lab 04/08/24 0451 04/09/24 0853  WBC 5.6 5.3    Microbiology Recent Results (from the past 240 hours)  Resp panel by RT-PCR (RSV, Flu A&B, Covid) Anterior Nasal Swab     Status: None   Collection Time: 04/06/24  8:14 PM   Specimen:  Anterior Nasal Swab  Result Value Ref Range Status   SARS Coronavirus 2 by RT PCR NEGATIVE NEGATIVE Final    Comment: (NOTE) SARS-CoV-2 target nucleic acids are NOT DETECTED.  The SARS-CoV-2 RNA is generally detectable in upper respiratory specimens during the acute phase of infection. The lowest concentration of SARS-CoV-2 viral copies this assay can detect is 138 copies/mL. A negative result does not preclude SARS-Cov-2 infection and should not be used as the sole basis for treatment or other patient management decisions. A negative result may occur with  improper specimen collection/handling, submission of specimen other than nasopharyngeal swab, presence of viral mutation(s) within the areas targeted by this assay, and inadequate number of viral copies(<138 copies/mL). A negative result must be combined with clinical observations, patient history, and epidemiological information. The expected result is Negative.  Fact Sheet for Patients:  BloggerCourse.com  Fact Sheet for Healthcare Providers:  SeriousBroker.it  This test is no t yet approved or cleared by the [place]  FDA and  has been authorized for detection and/or diagnosis of SARS-CoV-2 by FDA under an Emergency Use Authorization (EUA). This EUA  will remain  in effect (meaning this test can be used) for the duration of the COVID-19 declaration under Section 564(b)(1) of the Act, 21 U.S.C.section 360bbb-3(b)(1), unless the authorization is terminated  or revoked sooner.       Influenza A by PCR NEGATIVE NEGATIVE Final   Influenza B by PCR NEGATIVE NEGATIVE Final    Comment: (NOTE) The Xpert Xpress SARS-CoV-2/FLU/RSV plus assay is intended as an aid in the diagnosis of influenza from Nasopharyngeal swab specimens and should not be used as a sole basis for treatment. Nasal washings and aspirates are unacceptable for Xpert Xpress SARS-CoV-2/FLU/RSV testing.  Fact Sheet for Patients: BloggerCourse.com  Fact Sheet for Healthcare Providers: SeriousBroker.it  This test is not yet approved or cleared by the [place]  FDA and has been authorized for detection and/or diagnosis of SARS-CoV-2 by FDA under an Emergency Use Authorization (EUA). This EUA will remain in effect (meaning this test can be used) for the duration of the COVID-19 declaration under Section 564(b)(1) of the Act, 21 U.S.C. section 360bbb-3(b)(1), unless the authorization is terminated or revoked.     Resp Syncytial Virus by PCR NEGATIVE NEGATIVE Final    Comment: (NOTE) Fact Sheet for Patients: BloggerCourse.com  Fact Sheet for Healthcare Providers: SeriousBroker.it  This test is not yet approved or cleared by the [place]  FDA and has been authorized for detection and/or diagnosis of SARS-CoV-2 by FDA under an Emergency Use Authorization (EUA). This EUA will remain in effect (meaning this test can be used) for the duration of the COVID-19 declaration under Section 564(b)(1) of the Act, 21 U.S.C. section 360bbb-3(b)(1), unless the authorization is terminated or revoked.  Performed at [facility], [address] [place], [place]  [address]     Procedures and diagnostic studies:  No results found.             LOS: 4 days   Mick Tanguma  [facility]   Pager on [email or web address]. If 7PM-7AM, please contact night-coverage at [email or web address]     04/14/2024, 10:02 AM

## 2024-04-14 NOTE — Plan of Care (Signed)

## 2024-04-14 NOTE — Progress Notes (Signed)
 Occupational Therapy Treatment Patient Details Name: Bob Ortiz MRN: [id number] DOB: [birth year] Today's Date: 04/14/2024   History of present illness Bob Ortiz is an 83 y.o. caucasian male with medical history significant for paroxysmal atrial fibrillation status post pacemaker, nonischemic cardiomyopathy, combined systolic and diastolic CHF, Addison's disease, hypothyroidism and left bundle blanch block who presented to the emergency room with acute onset of generalized weakness and subsequent fall with inability to ambulate. MD assessment includes falls, AMS, and hyponatremia.   OT comments  Pt making good progress towards goals, alert and participatory for session. Bed mobility with MIN A, MIN A +2 to perform STS and functional transfers to [facility] for BM attempt, MAX A for standing pericare. Short bout of functional mobility with +2 for safety and performed standing oral care after seated recovery break. Pt requires cues for sequencing of brushing teeth, follows 75% of commands but worsens with fatigue. OT will continue to follow, discharge recommendation appropriate.       If plan is discharge home, recommend the following:  A lot of help with walking and/or transfers;A lot of help with bathing/dressing/bathroom;Assistance with feeding;Assist for transportation;Direct supervision/assist for medications management;Help with stairs or ramp for entrance;Assistance with cooking/housework;Direct supervision/assist for financial management;Supervision due to cognitive status   Equipment Recommendations  Other (comment)    Recommendations for Other Services Other (comment)    Precautions / Restrictions Precautions Precautions: Fall Recall of Precautions/Restrictions: Impaired Restrictions Weight Bearing Restrictions Per Provider Order: No       Mobility Bed Mobility Overal bed mobility: Needs Assistance Bed Mobility: Supine to Sit, Sit to Supine     Supine to sit: Min assist Sit to  supine: Min assist        Transfers Overall transfer level: Needs assistance Equipment used: 2 person hand held assist Transfers: Sit to/from Stand, Bed to chair/wheelchair/BSC Sit to Stand: Min assist, +2 physical assistance, +2 safety/equipment     Step pivot transfers: Min assist, +2 physical assistance, +2 safety/equipment           Balance Overall balance assessment: Needs assistance Sitting-balance support: Bilateral upper extremity supported, Feet supported Sitting balance-Leahy Scale: Fair     Standing balance support: Bilateral upper extremity supported, During functional activity, Reliant on assistive device for balance Standing balance-Leahy Scale: Poor                             ADL either performed or assessed with clinical judgement   ADL Overall ADL's : Needs assistance/impaired     Grooming: Oral care;Wash/dry hands;Wash/dry face;Standing;Sitting;Cueing for safety;Cueing for sequencing Grooming Details (indicate cue type and reason): seated washing face at EOB; stands for oral care, requires cues for sequencing, pt fatigues quickly with BLE weakness and increasing postural sway. pt requires cues to spit toothpaste and initiate task                 Toilet Transfer: BSC/3in1;+2 for safety/equipment;+2 for physical assistance;Cueing for sequencing Toilet Transfer Details (indicate cue type and reason): +2 HHA bed > BSC Toileting- Clothing Manipulation and Hygiene: Maximal assistance;Sit to/from stand Toileting - Clothing Manipulation Details (indicate cue type and reason): after small incontinent BM smear     Functional mobility during ADLs: Minimal assistance       Communication Communication Communication: Impaired Factors Affecting Communication: Difficulty expressing self   Cognition Arousal: Alert Behavior During Therapy: Flat affect Cognition: History of cognitive impairments  OT - Cognition Comments:  Oriented to self only, follows 75% of commands, decreases with pt fatigue                 Following commands: Impaired Following commands impaired: Follows one step commands inconsistently      Cueing   Cueing Techniques: Verbal cues, Tactile cues, Visual cues        General Comments Supportive family present throughout    Pertinent Vitals/ Pain       Pain Assessment Pain Assessment: PAINAD Breathing: normal Negative Vocalization: none Facial Expression: smiling or inexpressive Body Language: relaxed Consolability: no need to console PAINAD Score: 0 Pain Intervention(s): Limited activity within patient's tolerance, Monitored during session   Frequency  Min 1X/week        Progress Toward Goals  OT Goals(current goals can now be found in the care plan section)  Progress towards OT goals: Progressing toward goals  Acute Rehab OT Goals OT Goal Formulation: With patient Time For Goal Achievement: 04/22/24 Potential to Achieve Goals: Good ADL Goals Pt Will Perform Eating: with supervision;with set-up;sitting Pt Will Perform Grooming: with set-up;with contact guard assist;sitting Pt Will Perform Upper Body Dressing: sitting;with min assist Pt Will Transfer to Toilet: with min assist;ambulating;stand pivot transfer  Plan      Co-evaluation    PT/OT/SLP Co-Evaluation/Treatment: Yes Reason for Co-Treatment: Necessary to address cognition/behavior during functional activity;Complexity of the patient's impairments (multi-system involvement);To address functional/ADL transfers PT goals addressed during session: Mobility/safety with mobility OT goals addressed during session: ADL's and self-care      AM-PAC OT 6 Clicks Daily Activity     Outcome Measure   Help from another person eating meals?: A Lot Help from another person taking care of personal grooming?: A Lot Help from another person toileting, which includes using toliet, bedpan, or urinal?: A Lot Help  from another person bathing (including washing, rinsing, drying)?: A Lot Help from another person to put on and taking off regular upper body clothing?: A Lot Help from another person to put on and taking off regular lower body clothing?: A Lot 6 Click Score: 12    End of Session    OT Visit Diagnosis: Unsteadiness on feet (R26.81);Other abnormalities of gait and mobility (R26.89);Repeated falls (R29.6);Muscle weakness (generalized) (M62.81);Feeding difficulties (R63.3);Other symptoms and signs involving cognitive function   Activity Tolerance Patient tolerated treatment well   Patient Left in bed;with family/visitor present;with call bell/phone within reach;with bed alarm set   Nurse Communication Mobility status        Time: 8883-8858 OT Time Calculation (min): 25 min  Charges: OT General Charges $OT Visit: 1 Visit OT Treatments $Self Care/Home Management : 8-22 mins  Thania Woodlief L. Sherena Machorro, OTR/L  04/14/24, 3:04 PM

## 2024-04-14 NOTE — Progress Notes (Signed)
 [facility] Liaison Note  Follow up on new referral we had for hospice at home.  Changed to palliative consult when family decided to change to STR.  Bed authorized today from [employer].  We will cancel palliative care consult as [facility] will use their own hospice/palliative services.  Thank you for allowing participation in this patient's care  Saddie HILARIO Na, RN Nurse Liaison [phone]

## 2024-04-14 NOTE — Progress Notes (Signed)
 Palliative Care Progress Note, Assessment & Plan   Patient Name: Bob Ortiz       Date: 04/14/2024 DOB: 1941/01/02  Age: 83 y.o. MRN#: [id number] Attending Physician: Bob Durand, MD Primary Care Physician: Bob Amato, MD Admit Date: 04/06/2024  Subjective: Patient is sitting up in bed, awake and alert.  He acknowledges my presence.  He is able to say good morning but makes no other vocalizations or efforts to participate in conversations. His wife is at bedside during my visit.  HPI: 83 y.o. male  with past medical history significant for paroxysmal Afib s/p pacemaker, NICM, combined systolic and diastolic CHF, Addison's disease, hypothyroidism and LBBB. Patient presented to ED 04/06/2024 from home via EMS c/o generalized weakness, falls and inability to ambulate. On arrival to ED, pt was unable to provide history. Daughter and wife report patient had fall the day prior with concern for seizure since he bit his tongue. Pt was found to be hypoglycemic. Family also share patient has experienced significant weight loss over the past several weeks. Per chart, he was being evaluated by neurology at [facility] for bradykinesia and Lewy Body dementia.    ED workup found Na+ 134, glucose 62-->85, creatinine 0.55, calcium 8.2, alk phos 143, albumin 3.0, AST 54, t bili 1.3, BNP 215.8, and troponin 25-->28. WBC 5.2, Hgb 12.4. CT head revealed no acute intracranial abnormalities. 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  ED vitals 106/72, HR 70, RR 18, SpO2 99% and 97.6   [facility] was consulted for admission and management of hypoglycemia, falls and weight loss.    Palliative was consulted for assistance with goals of  conversations.  Summary of counseling/coordination of care: Extensive chart review completed prior to meeting patient including labs, vital signs, imaging, progress notes, orders, and available advanced directive documents from current and previous encounters.   After reviewing the patient's chart and assessing the patient at bedside, I spoke with patient and his wife in regards to symptom management and goals of care.   Symptoms assessed.  Patient denies headache, chest pain, nausea/vomiting, and other acute issues at this time.  Nonverbal signs of distress or pain not noted such as brow furling, grimacing, moaning, and fidgeting.  Patient appears to be in no apparent distress.  No adjustment to [facility] needed.  I discussed plan and goals of care with patient's wife at bedside as patient remains unable to participate in goals of care medical decision making independently at this time.  Patient's wife shares that the plan remains for patient to transfer to [facility].  She becomes tearful.  Therapeutic silence, active listening, and emotional support provided.  She shares she is worried that he will not get the care that he needs.  We discussed that this is not an ideal scenario since his full body dementia as well as significant functional status are contributing to an overall poor prognosis.  However, this seems like the best next step for him.  I also highlighted that outpatient palliative services will continue to follow.  Should the efforts and medical treatments become too much and causing more distress or less benefit, then patient  can enact his hospice benefits.  Patient's wife shares appreciation for our discussion.  Questions and concerns were addressed.  Given that patient is going to transfer to an outside medical facility, I reduced the concept of the MOST form.  Discussed importance of advance care planning prior to any urgent or emergent event.  She shares that they have created a living  will that include his medical wishes for care.  I advised her to bring copy for medical records.  Additionally, encouraged her to complete a MOST form in order for other facilities to recognize his boundaries of medical treatment.  She shared she would review it later and does not feel it completing it at this time.  Blank copy left at bedside.  Symptom burden is low.  Goals are clear.  PMT will step back from daily visits and monitor the patient peripherally.  Please re-engage with PMT if goals change, at patient/family's request, or if patient's health deteriorates during hospitalization.    Physical Exam Vitals reviewed.  Constitutional:      General: He is not in acute distress.    Appearance: He is normal weight.  HENT:     Head: Normocephalic.     Mouth/Throat:     Mouth: Mucous membranes are moist.  Eyes:     Pupils: Pupils are equal, round, and reactive to light.  Pulmonary:     Effort: Pulmonary effort is normal.  Abdominal:     Palpations: Abdomen is soft.  Skin:    General: Skin is warm and dry.  Neurological:     Mental Status: He is alert.     Comments: Oriented to self  Psychiatric:        Behavior: Behavior normal.             Time spent includes: Detailed review of medical records (labs, imaging, vital signs), medically appropriate exam (mental status, respiratory, cardiac, skin), discussed with treatment team, counseling and educating patient, family and staff, documenting clinical information, medication management and coordination of care.  Bob L. Arvid, DNP, FNP-BC Palliative Medicine Team

## 2024-04-15 DIAGNOSIS — E162 Hypoglycemia, unspecified: Secondary | ICD-10-CM | POA: Diagnosis not present

## 2024-04-15 LAB — GLUCOSE, CAPILLARY: Glucose-Capillary: 84 mg/dL (ref 70–99)

## 2024-04-15 NOTE — Plan of Care (Signed)

## 2024-04-15 NOTE — Progress Notes (Signed)
 Progress Note    Bob Ortiz  FMW:[id number] DOB: 26-Sep-1940  DOA: 04/06/2024 PCP: Salli Amato, MD      Brief Narrative:    Medical records reviewed and are as summarized below:  Bob Ortiz is a 83 y.o. male  with medical history significant for paroxysmal atrial fibrillation status post pacemaker, nonischemic cardiomyopathy, combined systolic and diastolic CHF, Addison's disease, hypothyroidism and left bundle blanch block, who presented to the hospital with generalized weakness, falls and inability to ambulate.  According to his daughter, patient fell the day prior to admission and on the day of admission.  Daughter suspected he might have had a seizure because he had bitten his tongue.  Glucose level was slightly low (62) in the ED    Assessment/Plan:   Principal Problem:   Hypoglycemia Active Problems:   Atrial fibrillation, chronic (HCC)   Essential hypertension   GERD without esophagitis   Hyponatremia    Body mass index is 22.2 kg/m.   Hyponatremia: Sodium slowly trending upward, from 126-127-128.  No longer on IV fluids. Suspect hyponatremia is probably multifactorial with contribution from adrenal insufficiency CHF, poor oral intake.   Combined systolic and diastolic CHF: Compensated.   He is tolerating room air.  No hypervolemia.   Dorthea, daughter, said patient was no longer taking carvedilol  and Jardiance at home because of issues with hypotension.  She said torsemide was only ordered as needed for swelling because of issues with dehydration.  She prefers that torsemide placed on hold and only used on a as needed basis for hypervolemia. 2D echo in May 2024 showed EF estimated at less than 20%, grade 3 diastolic dysfunction.   Paroxysmal atrial fibrillation: Continue Eliquis  and Coreg .  Tolerating Coreg  thus far.   Hypoglycemia, poor oral intake and unintentional weight loss: Glucose levels are stable.  Continue to monitor.   History of adrenal  insufficiency: Continue prednisone .  S/p treatment with IV hydrocortisone .  He was initially diagnosed with adrenal insufficiency at [facility] in 2019 in the setting of pituitary mass and was started on hydrocortisone .  He was subsequently transitioned to prednisone .  However, daughter is not sure that patient has been taking his medicines as prescribed.   Lewy body dementia, intermittent agitation: Continue Seroquel  and supportive care Frequent falls, general weakness: PT recommended discharge to SNF.   Comorbidities include GERD, hypertension, abnormal thyroid function test, normocytic anemia   Awaiting placement to SNF   Diet Order             Diet regular Room service appropriate? Yes; Fluid consistency: Thin  Diet effective now                                  Consultants: Palliative care and hospice  Procedures: None    Medications:    apixaban   5 mg Oral BID   carvedilol   3.125 mg Oral BID WC   cholecalciferol   1,000 Units Oral Daily   cyanocobalamin   1,000 mcg Oral Daily   feeding supplement  237 mL Oral BID BM   magnesium  oxide  800 mg Oral Daily   predniSONE   5 mg Oral Q breakfast   QUEtiapine   12.5 mg Oral QHS   Continuous Infusions:   Anti-infectives (From admission, onward)    None              Family Communication/Anticipated D/C date and plan/Code Status  DVT prophylaxis:  apixaban  (ELIQUIS ) tablet 5 mg     Code Status: Limited: Do not attempt resuscitation (DNR) -DNR-LIMITED -Do Not Intubate/DNI   Family Communication: Plan discussed with his wife at the bedside and Dorthea, daughter, over the phone Disposition Plan: Plan to discharge to SNF    Status is: Inpatient Remains inpatient appropriate because: Awaiting placement to SNF       Subjective:   Interval events noted.  Patient had loose stools this morning.  Wife at the bedside.  Nurse techs at the bedside.  Objective:    Vitals:   04/14/24 1724  04/14/24 2118 04/15/24 0324 04/15/24 0815  BP: 111/87 125/79 126/73 103/67  Pulse: 70 71 70 70  Resp: 18 16 10 14   Temp: 98.2 F (36.8 C) 97.7 F (36.5 C)  98 F (36.7 C)  TempSrc:      SpO2: 100% 97% 95% 99%  Weight:       No data found.   Intake/Output Summary (Last 24 hours) at 04/15/2024 1341 Last data filed at 04/15/2024 0935 Gross per 24 hour  Intake 240 ml  Output 2250 ml  Net -2010 ml   Filed Weights   04/06/24 1935  Weight: 68.2 kg    Exam:   GEN: NAD SKIN: Warm and dry EYES: No pallor or icterus ENT: MMM CV: RRR PULM: CTA B ABD: soft, ND, NT, +BS CNS: Alert but confused EXT: No edema or tenderness     Data Reviewed:   I have personally reviewed following labs and imaging studies:  Labs: Labs show the following:   Basic Metabolic Panel: Recent Labs  Lab 04/09/24 0853 04/10/24 0354 04/11/24 0940 04/13/24 0920  NA 128* 126* 127* 128*  K 4.1 4.4 3.6 3.5  CL 96* 95* 96* 96*  CO2 24 25 23 26   GLUCOSE 74 80 97 93  BUN 15 17 21 9   CREATININE 0.78 0.88 0.71 0.62  CALCIUM 8.2* 8.1* 8.3* 7.8*  MG 2.0 1.9  --   --    GFR Estimated Creatinine Clearance: 68.7 mL/min (by C-G formula based on SCr of 0.62 mg/dL). Liver Function Tests: No results for input(s): AST, ALT, ALKPHOS, BILITOT, PROT, ALBUMIN in the last 168 hours.  No results for input(s): LIPASE, AMYLASE in the last 168 hours. No results for input(s): AMMONIA in the last 168 hours. Coagulation profile No results for input(s): INR, PROTIME in the last 168 hours.  CBC: Recent Labs  Lab 04/09/24 0853  WBC 5.3  HGB 11.5*  HCT 33.5*  MCV 83.5  PLT 190   Cardiac Enzymes: No results for input(s): CKTOTAL, CKMB, CKMBINDEX, TROPONINI in the last 168 hours. BNP (last 3 results) No results for input(s): PROBNP in the last 8760 hours. CBG: Recent Labs  Lab 04/10/24 1731 04/11/24 0027 04/11/24 0623 04/11/24 1225 04/15/24 0341  GLUCAP 144* 133* 109*  97 84   D-Dimer: No results for input(s): DDIMER in the last 72 hours. Hgb A1c: No results for input(s): HGBA1C in the last 72 hours. Lipid Profile: No results for input(s): CHOL, HDL, LDLCALC, TRIG, CHOLHDL, LDLDIRECT in the last 72 hours. Thyroid function studies: No results for input(s): TSH, T4TOTAL, T3FREE, THYROIDAB in the last 72 hours.  Invalid input(s): FREET3  Anemia work up: No results for input(s): VITAMINB12, FOLATE, FERRITIN, TIBC, IRON, RETICCTPCT in the last 72 hours.  Sepsis Labs: Recent Labs  Lab 04/09/24 0853  WBC 5.3    Microbiology Recent Results (from the past 240 hours)  Resp panel  by RT-PCR (RSV, Flu A&B, Covid) Anterior Nasal Swab     Status: None   Collection Time: 04/06/24  8:14 PM   Specimen: Anterior Nasal Swab  Result Value Ref Range Status   SARS Coronavirus 2 by RT PCR NEGATIVE NEGATIVE Final    Comment: (NOTE) SARS-CoV-2 target nucleic acids are NOT DETECTED.  The SARS-CoV-2 RNA is generally detectable in upper respiratory specimens during the acute phase of infection. The lowest concentration of SARS-CoV-2 viral copies this assay can detect is 138 copies/mL. A negative result does not preclude SARS-Cov-2 infection and should not be used as the sole basis for treatment or other patient management decisions. A negative result may occur with  improper specimen collection/handling, submission of specimen other than nasopharyngeal swab, presence of viral mutation(s) within the areas targeted by this assay, and inadequate number of viral copies(<138 copies/mL). A negative result must be combined with clinical observations, patient history, and epidemiological information. The expected result is Negative.  Fact Sheet for Patients:  BloggerCourse.com  Fact Sheet for Healthcare Providers:  SeriousBroker.it  This test is no t yet approved or cleared by the  [place]  FDA and  has been authorized for detection and/or diagnosis of SARS-CoV-2 by FDA under an Emergency Use Authorization (EUA). This EUA will remain  in effect (meaning this test can be used) for the duration of the COVID-19 declaration under Section 564(b)(1) of the Act, 21 U.S.C.section 360bbb-3(b)(1), unless the authorization is terminated  or revoked sooner.       Influenza A by PCR NEGATIVE NEGATIVE Final   Influenza B by PCR NEGATIVE NEGATIVE Final    Comment: (NOTE) The Xpert Xpress SARS-CoV-2/FLU/RSV plus assay is intended as an aid in the diagnosis of influenza from Nasopharyngeal swab specimens and should not be used as a sole basis for treatment. Nasal washings and aspirates are unacceptable for Xpert Xpress SARS-CoV-2/FLU/RSV testing.  Fact Sheet for Patients: BloggerCourse.com  Fact Sheet for Healthcare Providers: SeriousBroker.it  This test is not yet approved or cleared by the [place]  FDA and has been authorized for detection and/or diagnosis of SARS-CoV-2 by FDA under an Emergency Use Authorization (EUA). This EUA will remain in effect (meaning this test can be used) for the duration of the COVID-19 declaration under Section 564(b)(1) of the Act, 21 U.S.C. section 360bbb-3(b)(1), unless the authorization is terminated or revoked.     Resp Syncytial Virus by PCR NEGATIVE NEGATIVE Final    Comment: (NOTE) Fact Sheet for Patients: BloggerCourse.com  Fact Sheet for Healthcare Providers: SeriousBroker.it  This test is not yet approved or cleared by the [place]  FDA and has been authorized for detection and/or diagnosis of SARS-CoV-2 by FDA under an Emergency Use Authorization (EUA). This EUA will remain in effect (meaning this test can be used) for the duration of the COVID-19 declaration under Section 564(b)(1) of the Act, 21 U.S.C. section  360bbb-3(b)(1), unless the authorization is terminated or revoked.  Performed at [facility], [address] [place], [place] [address]     Procedures and diagnostic studies:  No results found.             LOS: 5 days   Caryssa Elzey  [facility]   Pager on [email or web address]. If 7PM-7AM, please contact night-coverage at [email or web address]     04/15/2024, 1:41 PM

## 2024-04-16 DIAGNOSIS — E162 Hypoglycemia, unspecified: Secondary | ICD-10-CM | POA: Diagnosis not present

## 2024-04-16 LAB — BASIC METABOLIC PANEL WITH GFR
Anion gap: 8 (ref 5–15)
BUN: 10 mg/dL (ref 8–23)
CO2: 29 mmol/L (ref 22–32)
Calcium: 7.9 mg/dL — ABNORMAL LOW (ref 8.9–10.3)
Chloride: 94 mmol/L — ABNORMAL LOW (ref 98–111)
Creatinine, Ser: 0.43 mg/dL — ABNORMAL LOW (ref 0.61–1.24)
GFR, Estimated: >60 mL/min (ref >60)
Glucose, Bld: 82 mg/dL (ref 70–99)
Potassium: 4.1 mmol/L (ref 3.5–5.1)
Sodium: 131 mmol/L — ABNORMAL LOW (ref 135–145)

## 2024-04-16 NOTE — Progress Notes (Signed)
 Mobility Specialist Progress Note:    04/16/24 1600  Mobility  Activity Pivoted/transferred from bed to chair  Level of Assistance Minimal assist, patient does 75% or more  Assistive Device Front wheel walker  Distance Ambulated (ft) 3 ft  Range of Motion/Exercises Active;All extremities  Activity Response Tolerated well  Mobility visit 1 Mobility  Mobility Specialist Start Time (ACUTE ONLY) 1543  Mobility Specialist Stop Time (ACUTE ONLY) 1555  Mobility Specialist Time Calculation (min) (ACUTE ONLY) 12 min   RN requested assistance for transfer. Required MinA to stand and pivot with RW. Tolerated well, asx throughout. Alarm on, belongings in reach. All needs met.  Sherrilee Ditty Mobility Specialist Please contact via Special educational needs teacher or  Rehab office at [phone]

## 2024-04-16 NOTE — Progress Notes (Signed)
 Progress Note    Bob Ortiz  FMW:[id number] DOB: 01-Aug-1941  DOA: 04/06/2024 PCP: Bob Amato, MD      Brief Narrative:    Medical records reviewed and are as summarized below:  Bob Ortiz is a 83 y.o. male  with medical history significant for paroxysmal atrial fibrillation status post pacemaker, nonischemic cardiomyopathy, combined systolic and diastolic CHF, Addison's disease, hypothyroidism and left bundle blanch block, who presented to the hospital with generalized weakness, falls and inability to ambulate.  According to his Bob Ortiz, patient fell the day prior to admission and on the day of admission.  Bob Ortiz suspected he might have had a seizure because he had bitten his tongue.  Glucose level was slightly low (62) in the ED    Assessment/Plan:   Principal Problem:   Hypoglycemia Active Problems:   Atrial fibrillation, chronic (HCC)   Essential hypertension   GERD without esophagitis   Hyponatremia    Body mass index is 22.2 kg/m.   Hyponatremia: Overall sodium has improved.  Up from 126-131. S/p treatment with IV fluids. Suspect hyponatremia is probably multifactorial with contribution from adrenal insufficiency CHF, poor oral intake.   Combined systolic and diastolic CHF: Compensated.   He is tolerating room air.  No hypervolemia.   Spoke with Bob Ortiz, Bob Ortiz, on 04/15/2024.  Bob Ortiz said patient was no longer taking carvedilol  and Jardiance at home because of issues with hypotension.  She said torsemide was only ordered as needed for swelling because of issues with dehydration.  She prefers that torsemide be placed on hold and only used on a as needed basis for hypervolemia. 2D echo in May 2024 showed EF estimated at less than 20%, grade 3 diastolic dysfunction.   Paroxysmal atrial fibrillation: Continue Eliquis  and Coreg .  Tolerating Coreg  thus far.   Hypoglycemia, poor oral intake and unintentional weight loss: Glucose levels are stable.   Continue to monitor.   History of adrenal insufficiency: Continue prednisone .  S/p treatment with IV hydrocortisone .  He was initially diagnosed with adrenal insufficiency at [facility] in 2019 in the setting of pituitary mass and was started on hydrocortisone .  He was subsequently transitioned to prednisone .  However, Bob Ortiz is not sure that patient has been taking his medicines as prescribed.   Lewy body dementia, intermittent agitation: Continue Seroquel  and supportive care Frequent falls, general weakness: PT recommended discharge to SNF.   Comorbidities include GERD, hypertension, abnormal thyroid function test, normocytic anemia   Awaiting placement to SNF   Diet Order             Diet regular Room service appropriate? Yes; Fluid consistency: Thin  Diet effective now                                  Consultants: Palliative care and hospice  Procedures: None    Medications:    apixaban   5 mg Oral BID   carvedilol   3.125 mg Oral BID WC   cholecalciferol   1,000 Units Oral Daily   cyanocobalamin   1,000 mcg Oral Daily   feeding supplement  237 mL Oral BID BM   magnesium  oxide  800 mg Oral Daily   predniSONE   5 mg Oral Q breakfast   QUEtiapine   12.5 mg Oral QHS   Continuous Infusions:   Anti-infectives (From admission, onward)    None              Family  Communication/Anticipated D/C date and plan/Code Status   DVT prophylaxis:  apixaban  (ELIQUIS ) tablet 5 mg     Code Status: Limited: Do not attempt resuscitation (DNR) -DNR-LIMITED -Do Not Intubate/DNI   Family Communication: Plan discussed with his wife at the bedside  Disposition Plan: Plan to discharge to SNF    Status is: Inpatient Remains inpatient appropriate because: Awaiting placement to SNF       Subjective:   No acute events overnight.  No complaints.  Wife at the bedside.  Objective:    Vitals:   04/15/24 1925 04/16/24 0000 04/16/24 0337 04/16/24 0800   BP: 100/60 102/65 119/80 108/75  Pulse: 70 69 71 73  Resp: 18 17 16 18   Temp: 98 F (36.7 C) 98 F (36.7 C)    TempSrc:      SpO2: 98% 98% 96% 100%  Weight:       No data found.   Intake/Output Summary (Last 24 hours) at 04/16/2024 1335 Last data filed at 04/16/2024 0415 Gross per 24 hour  Intake --  Output 750 ml  Net -750 ml   Filed Weights   04/06/24 1935  Weight: 68.2 kg    Exam:  GEN: NAD SKIN: Warm and dry EYES: Anicteric ENT: MMM CV: RRR PULM: CTA B ABD: soft, ND, NT, +BS CNS: AAO x person, EXT: No edema or tenderness      Data Reviewed:   I have personally reviewed following labs and imaging studies:  Labs: Labs show the following:   Basic Metabolic Panel: Recent Labs  Lab 04/10/24 0354 04/11/24 0940 04/13/24 0920 04/16/24 0840  NA 126* 127* 128* 131*  K 4.4 3.6 3.5 4.1  CL 95* 96* 96* 94*  CO2 25 23 26 29   GLUCOSE 80 97 93 82  BUN 17 21 9 10   CREATININE 0.88 0.71 0.62 0.43*  CALCIUM 8.1* 8.3* 7.8* 7.9*  MG 1.9  --   --   --    GFR Estimated Creatinine Clearance: 68.7 mL/min (A) (by C-G formula based on SCr of 0.43 mg/dL (L)). Liver Function Tests: No results for input(s): AST, ALT, ALKPHOS, BILITOT, PROT, ALBUMIN in the last 168 hours.  No results for input(s): LIPASE, AMYLASE in the last 168 hours. No results for input(s): AMMONIA in the last 168 hours. Coagulation profile No results for input(s): INR, PROTIME in the last 168 hours.  CBC: No results for input(s): WBC, NEUTROABS, HGB, HCT, MCV, PLT in the last 168 hours.  Cardiac Enzymes: No results for input(s): CKTOTAL, CKMB, CKMBINDEX, TROPONINI in the last 168 hours. BNP (last 3 results) No results for input(s): PROBNP in the last 8760 hours. CBG: Recent Labs  Lab 04/10/24 1731 04/11/24 0027 04/11/24 0623 04/11/24 1225 04/15/24 0341  GLUCAP 144* 133* 109* 97 84   D-Dimer: No results for input(s): DDIMER in the last  72 hours. Hgb A1c: No results for input(s): HGBA1C in the last 72 hours. Lipid Profile: No results for input(s): CHOL, HDL, LDLCALC, TRIG, CHOLHDL, LDLDIRECT in the last 72 hours. Thyroid function studies: No results for input(s): TSH, T4TOTAL, T3FREE, THYROIDAB in the last 72 hours.  Invalid input(s): FREET3  Anemia work up: No results for input(s): VITAMINB12, FOLATE, FERRITIN, TIBC, IRON, RETICCTPCT in the last 72 hours.  Sepsis Labs: No results for input(s): PROCALCITON, WBC, LATICACIDVEN in the last 168 hours.   Microbiology Recent Results (from the past 240 hours)  Resp panel by RT-PCR (RSV, Flu A&B, Covid) Anterior Nasal Swab     Status:  None   Collection Time: 04/06/24  8:14 PM   Specimen: Anterior Nasal Swab  Result Value Ref Range Status   SARS Coronavirus 2 by RT PCR NEGATIVE NEGATIVE Final    Comment: (NOTE) SARS-CoV-2 target nucleic acids are NOT DETECTED.  The SARS-CoV-2 RNA is generally detectable in upper respiratory specimens during the acute phase of infection. The lowest concentration of SARS-CoV-2 viral copies this assay can detect is 138 copies/mL. A negative result does not preclude SARS-Cov-2 infection and should not be used as the sole basis for treatment or other patient management decisions. A negative result may occur with  improper specimen collection/handling, submission of specimen other than nasopharyngeal swab, presence of viral mutation(s) within the areas targeted by this assay, and inadequate number of viral copies(<138 copies/mL). A negative result must be combined with clinical observations, patient history, and epidemiological information. The expected result is Negative.  Fact Sheet for Patients:  BloggerCourse.com  Fact Sheet for Healthcare Providers:  SeriousBroker.it  This test is no t yet approved or cleared by the [place]  FDA and   has been authorized for detection and/or diagnosis of SARS-CoV-2 by FDA under an Emergency Use Authorization (EUA). This EUA will remain  in effect (meaning this test can be used) for the duration of the COVID-19 declaration under Section 564(b)(1) of the Act, 21 U.S.C.section 360bbb-3(b)(1), unless the authorization is terminated  or revoked sooner.       Influenza A by PCR NEGATIVE NEGATIVE Final   Influenza B by PCR NEGATIVE NEGATIVE Final    Comment: (NOTE) The Xpert Xpress SARS-CoV-2/FLU/RSV plus assay is intended as an aid in the diagnosis of influenza from Nasopharyngeal swab specimens and should not be used as a sole basis for treatment. Nasal washings and aspirates are unacceptable for Xpert Xpress SARS-CoV-2/FLU/RSV testing.  Fact Sheet for Patients: BloggerCourse.com  Fact Sheet for Healthcare Providers: SeriousBroker.it  This test is not yet approved or cleared by the [place]  FDA and has been authorized for detection and/or diagnosis of SARS-CoV-2 by FDA under an Emergency Use Authorization (EUA). This EUA will remain in effect (meaning this test can be used) for the duration of the COVID-19 declaration under Section 564(b)(1) of the Act, 21 U.S.C. section 360bbb-3(b)(1), unless the authorization is terminated or revoked.     Resp Syncytial Virus by PCR NEGATIVE NEGATIVE Final    Comment: (NOTE) Fact Sheet for Patients: BloggerCourse.com  Fact Sheet for Healthcare Providers: SeriousBroker.it  This test is not yet approved or cleared by the [place]  FDA and has been authorized for detection and/or diagnosis of SARS-CoV-2 by FDA under an Emergency Use Authorization (EUA). This EUA will remain in effect (meaning this test can be used) for the duration of the COVID-19 declaration under Section 564(b)(1) of the Act, 21 U.S.C. section 360bbb-3(b)(1),  unless the authorization is terminated or revoked.  Performed at [facility], [address] [place], [place] [address]     Procedures and diagnostic studies:  No results found.             LOS: 6 days   Nixon Kolton  [facility]   Pager on [email or web address]. If 7PM-7AM, please contact night-coverage at [email or web address]     04/16/2024, 1:35 PM

## 2024-04-17 DIAGNOSIS — E162 Hypoglycemia, unspecified: Secondary | ICD-10-CM | POA: Diagnosis not present

## 2024-04-17 DIAGNOSIS — F0390 Unspecified dementia without behavioral disturbance: Secondary | ICD-10-CM | POA: Insufficient documentation

## 2024-04-17 MED ORDER — QUETIAPINE FUMARATE 25 MG PO TABS: 12.5000 mg | ORAL_TABLET | Freq: Every day | ORAL | Status: AC

## 2024-04-17 MED ORDER — PREDNISONE 5 MG PO TABS: 5.0000 mg | ORAL_TABLET | Freq: Every day | ORAL | Status: AC

## 2024-04-17 MED ORDER — TORSEMIDE 20 MG PO TABS: 20.0000 mg | ORAL_TABLET | Freq: Every day | ORAL | Status: AC | PRN

## 2024-04-17 NOTE — Progress Notes (Signed)
   04/17/24 1045  Spiritual Encounters  Type of Visit Follow up  Care provided to: Pt and family  Referral source Chaplain assessment  Reason for visit Routine spiritual support  Spiritual Framework  Presenting Themes Caregiving needs  Interventions  Spiritual Care Interventions Made Compassionate presence;Reflective listening  Intervention Outcomes  Outcomes Connection to spiritual care  Spiritual Care Plan  Spiritual Care Issues Still Outstanding No further spiritual care needs at this time (see row info)   Chaplain has seen pt and his family before. Daughter, Dorthea, saw chaplain in lobby and let her know that her dad was back. Chaplain stopped in and spoke with pt and pt's wife and encouraged pt's wife to take care of herself.

## 2024-04-17 NOTE — Plan of Care (Signed)

## 2024-04-17 NOTE — Progress Notes (Signed)
 Mobility Specialist - Progress Note   04/17/24 1023  Mobility  Activity Ambulated with assistance  Level of Assistance Minimal assist, patient does 75% or more  Assistive Device Front wheel walker  Distance Ambulated (ft) 60 ft  Activity Response Tolerated well  Mobility visit 1 Mobility  Mobility Specialist Start Time (ACUTE ONLY) [id number]  Mobility Specialist Stop Time (ACUTE ONLY) 1001  Mobility Specialist Time Calculation (min) (ACUTE ONLY) 9 min   Pt supine upon entry, utilizing RA. Pt agreeable to OOB amb this date. Pt completed bed mob modI, extra time requied to bring trunk from up to sit. Pt STS to RW CGA, amb one hallway (~40 ft) MinA--- requiring VC's to keep RW grounded as well as min manual manipulation of the RW. +2 for safety during amb, however not utilized. Pt returned to the room, left seated in the recliner with alarm set and needs within reach.  America Silvan Mobility Specialist 04/17/24 10:37 AM

## 2024-04-17 NOTE — Discharge Summary (Signed)
 Physician Discharge Summary   Patient: Bob Ortiz MRN: [id number] DOB: 09-Aug-1940  Admit date:     04/06/2024  Discharge date: 04/17/24  Discharge Physician: AIDA CHO   PCP: Salli Amato, MD   Recommendations at discharge:   Follow-up with physician at the skilled nursing facility within 3 days of discharge  Discharge Diagnoses: Principal Problem:   Hypoglycemia Active Problems:   Atrial fibrillation, chronic (HCC)   Essential hypertension   GERD without esophagitis   Hyponatremia  Resolved Problems:   * No resolved hospital problems. *  Hospital Course:  Bob Ortiz is a 83 y.o. male  with medical history significant for paroxysmal atrial fibrillation status post pacemaker, nonischemic cardiomyopathy, combined systolic and diastolic CHF, Addison's disease, hypothyroidism and left bundle blanch block, who presented to the hospital with generalized weakness, falls and inability to ambulate.  According to his daughter, patient fell the day prior to admission and on the day of admission.  Daughter suspected he might have had a seizure because he had bitten his tongue.   Glucose level was slightly low (62) in the ED      Assessment and Plan:   Hyponatremia: Overall sodium has improved.  Up from 126-131. S/p treatment with IV fluids. Suspect hyponatremia is probably multifactorial with contribution from adrenal insufficiency CHF, poor oral intake.     Combined systolic and diastolic CHF: Compensated.   He is tolerating room air.  No hypervolemia.   Use torsemide  only as needed for swelling or fluid overload. 2D echo in May 2024 showed EF estimated at less than 20%, grade 3 diastolic dysfunction.     Paroxysmal atrial fibrillation: Continue Eliquis  and Coreg .  Bob Ortiz said Coreg  had been discontinued prior to admission because of issues with hypotension.  However, patient has been getting Coreg  in the hospital and BP has been stable thus far.  He will continue Coreg   at discharge. Monitor BP closely and adjust or discontinue Coreg  as needed.     Hypoglycemia, poor oral intake and unintentional weight loss: Glucose levels are stable.       History of adrenal insufficiency: Continue prednisone .  S/p treatment with IV hydrocortisone .  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  He was subsequently transitioned to prednisone .  However, daughter is not sure that patient has been taking his medicines as prescribed.     Lewy body dementia, intermittent agitation: Continue Seroquel  and supportive care. Of note, patient had been prescribed Seroquel  prior to admission but he had not started it yet. Frequent falls, general weakness: PT recommended discharge to SNF.     Comorbidities include GERD, hypertension, abnormal thyroid function test, normocytic anemia     Discharge plan was discussed with his wife at the bedside. Discharge plan was discussed with Bob Ortiz, daughter, over the phone.  We went over all discharge medications.         Consultants: None Procedures performed: None  Disposition: Skilled nursing facility Diet recommendation:  Discharge Diet Orders (From admission, onward)     Start     Ordered   04/17/24 0000  Diet - low sodium heart healthy        04/17/24 1105           Cardiac diet DISCHARGE MEDICATION: Allergies as of 04/17/2024       Reactions   Quinapril Swelling   Bumetanide  Other (See Comments)   Spironolactone Other (See Comments)  Torsemide  Other (See Comments)        Medication List     STOP taking these medications    empagliflozin 10 MG Tabs tablet Commonly known as: JARDIANCE   ethacrynic  acid 25 MG tablet Commonly known as: EDECRIN    Melatonin 1 MG Chew   omeprazole 40 MG capsule Commonly known as: PRILOSEC       TAKE these medications    carvedilol  3.125 MG tablet Commonly known as:  COREG  Take 3.125 mg by mouth in the morning and at bedtime.   cyanocobalamin  1000 MCG tablet Commonly known as: VITAMIN B12 Take 1,000 mcg by mouth daily.   Eliquis  5 MG Tabs tablet Generic drug: apixaban  Take 5 mg by mouth 2 (two) times daily.   magnesium  oxide 400 (240 Mg) MG tablet Commonly known as: MAG-OX Take 2 tablets by mouth daily.   predniSONE  5 MG tablet Commonly known as: DELTASONE  Take 1 tablet (5 mg total) by mouth daily with breakfast. Start taking on: April 18, 2024   QUEtiapine  25 MG tablet Commonly known as: SEROQUEL  Take 0.5 tablets (12.5 mg total) by mouth at bedtime.   torsemide  20 MG tablet Commonly known as: DEMADEX  Take 1 tablet (20 mg total) by mouth daily as needed (for swelling). Take 1 tablet (20 mg total) by mouth once daily Take an extra 20 mg in the afternoon as needed What changed:  when to take this reasons to take this   Vitamin D -1000 Max St 25 MCG (1000 UT) tablet Generic drug: Cholecalciferol  Take 1,000 Units by mouth daily.               Durable Medical Equipment  (From admission, onward)           Start     Ordered   04/10/24 0844  For home use only DME 3 n 1  Once        04/10/24 0843   04/10/24 0844  For home use only DME Overbed table  Once        04/10/24 0843   04/10/24 0844  For home use only DME Walker rolling  Once       Question Answer Comment  Walker: With 5 Inch Wheels   Patient needs a walker to treat with the following condition Generalized weakness      04/10/24 0843   04/10/24 0843  For home use only DME Hospital bed  Once       Question Answer Comment  Length of Need Lifetime   The above medical condition requires: Patient requires the ability to reposition frequently   Bed type Semi-electric   Hoyer Lift Yes      04/10/24 0843            Contact information for after-discharge care     Destination     [facility] .    Service: Skilled Nursing Contact information: [address] [place]  [address] [phone]                    Discharge Exam: Fredricka Weights   04/06/24 1935  Weight: 68.2 kg   GEN: NAD SKIN: Warm and dry EYES: No pallor or icterus ENT: MMM CV: RRR PULM: CTA B ABD: soft, ND, NT, +BS CNS: AAO x 1 (person), non focal EXT: No edema or tenderness   Condition at discharge: stable  The results of significant diagnostics from this hospitalization (including imaging, microbiology, ancillary  and laboratory) are listed below for reference.   Imaging Studies: DG HIPS BILAT WITH PELVIS MIN 5 VIEWS Result Date: 04/09/2024 CLINICAL DATA:  Hip pain after fall 1 day ago. EXAM: DG HIP (WITH OR WITHOUT PELVIS) 5+V BILAT COMPARISON:  None Available. FINDINGS: There is no evidence of hip fracture or dislocation. Mild narrowing is seen involving both hip joints with minimal osteophyte formation. 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 No acute abnormality seen. Electronically Signed   By: Lynwood Landy Raddle M.D.   On: 04/09/2024 17:09   DG Shoulder Right Result Date: 04/09/2024 CLINICAL DATA:  Right shoulder pain after fall 1 day ago. EXAM: RIGHT SHOULDER - 2+ VIEW COMPARISON:  None Available. FINDINGS: There is no evidence of fracture or dislocation. Mild degenerative changes seen involving right acromioclavicular joint. Calcifications are seen overlying right humeral head suggesting calcific tendinosis. Soft tissues are unremarkable. IMPRESSION: Chronic findings as noted above.  No acute abnormality seen. Electronically Signed   By: Lynwood Landy Raddle M.D.   On: 04/09/2024 17:07   DG Shoulder Left Result Date: 04/09/2024 CLINICAL DATA:  Left shoulder pain after fall yesterday. EXAM: LEFT SHOULDER - 2+ VIEW COMPARISON:  None Available. FINDINGS: There is no evidence of fracture or dislocation. Moderate degenerative changes seen involving left kidney numeral  joint. Lobular calcifications are seen more inferiorly potentially suggesting calcific bursitis. Soft tissues are unremarkable. IMPRESSION: Moderate degenerative joint disease of left glenohumeral joint. Lobular calcifications are seen more inferiorly potentially suggesting calcific bursitis. No acute abnormality seen. Electronically Signed   By: Lynwood Landy Raddle M.D.   On: 04/09/2024 17:06   DG Knee 1-2 Views Right Result Date: 04/08/2024 CLINICAL DATA:  Fall. EXAM: RIGHT KNEE - 1-2 VIEW COMPARISON:  None Available. FINDINGS: Small suprapatellar joint effusion is noted. No fracture or dislocation is noted. Moderate narrowing of medial and lateral joint spaces is noted. IMPRESSION: Moderate degenerative joint disease. Small suprapatellar joint effusion. No fracture or dislocation. Electronically Signed   By: Lynwood Landy Raddle M.D.   On: 04/08/2024 10:41   DG Knee 1-2 Views Left Result Date: 04/08/2024 CLINICAL DATA:  Status post fall EXAM: LEFT KNEE - 2 VIEW COMPARISON:  None Available. FINDINGS: No acute fracture or dislocation. Small joint effusion. Amorphous radiodensity projects over the joint tibiofemoral space. Soft tissues are unremarkable. IMPRESSION: 1. No acute fracture or dislocation. 2. Small joint effusion. 3. Amorphous radiodensity projects over the joint tibiofemoral space, which may represent chondrocalcinosis. Electronically Signed   By: Limin  Xu M.D.   On: 04/08/2024 10:21   MR BRAIN WO CONTRAST Result Date: 04/07/2024 EXAM: MRI BRAIN WITHOUT CONTRAST 04/07/2024 03:59:00 PM TECHNIQUE: Multiplanar multisequence MRI of the head/brain was performed without the administration of intravenous contrast. COMPARISON: None available. CLINICAL HISTORY: Mental status change, unknown cause. FINDINGS: BRAIN AND VENTRICLES: No acute infarct or hemorrhage. Focus of susceptibility in the anterior left temporal lobe, most consistent with chronic microhemorrhage. No mass. No midline shift. No hydrocephalus. The  sella is unremarkable. Normal flow voids. ORBITS: No acute abnormality. SINUSES AND MASTOIDS: No acute abnormality. BONES AND SOFT TISSUES: Normal marrow signal. No acute soft tissue abnormality. LIMITATIONS/ARTIFACTS: Motion artifact degrades the image quality of multiple sequences. IMPRESSION: 1. No acute infarct or hemorrhage within the limitations of motion artifact. Electronically signed by: Ryan Chess MD 04/07/2024 04:24 PM EDT RP Workstation: [id number]   CT HEAD WO CONTRAST ( ) Result Date: 04/06/2024 CLINICAL DATA:  falls on AC, eval ich; fall EXAM: CT HEAD WITHOUT CONTRAST  CT CERVICAL SPINE WITHOUT CONTRAST TECHNIQUE: Multidetector CT imaging of the head and cervical spine was performed following the standard protocol without intravenous contrast. Multiplanar CT image reconstructions of the cervical spine were also generated. RADIATION DOSE REDUCTION: This exam was performed according to the departmental dose-optimization program which includes automated exposure control, adjustment of the mA and/or kV according to patient size and/or use of iterative reconstruction technique. COMPARISON:  None Available. FINDINGS: CT HEAD FINDINGS Brain: No evidence of large-territorial acute infarction. No parenchymal hemorrhage. No mass lesion. No extra-axial collection. No mass effect or midline shift. No hydrocephalus. Basilar cisterns are patent. Vascular: No hyperdense vessel. Atherosclerotic calcifications are present within the cavernous internal carotid arteries. Skull: No acute fracture or focal lesion. Sinuses/Orbits: Paranasal sinuses and mastoid air cells are clear. The orbits are unremarkable. Other: None. CT CERVICAL SPINE FINDINGS Alignment: Grade 1 anterolisthesis of C2 on C3. Skull base and vertebrae: Multilevel severe degenerative change of the spine. 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 No acute fracture. No aggressive appearing focal osseous lesion  or focal pathologic process. Soft tissues and spinal canal: No prevertebral fluid or swelling. No visible canal hematoma. Upper chest: Biapical paraseptal on sinus changes. Other: None. IMPRESSION: 1. No acute intracranial abnormality. 2. No acute displaced fracture or traumatic listhesis of the cervical spine. 3. 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 Electronically Signed   By: Morgane  Naveau M.D.   On: 04/06/2024 20:59   CT Cervical Spine Wo Contrast Result Date: 04/06/2024 CLINICAL DATA:  falls on AC, eval ich; fall EXAM: CT HEAD WITHOUT CONTRAST CT CERVICAL SPINE WITHOUT CONTRAST TECHNIQUE: Multidetector CT imaging of the head and cervical spine was performed following the standard protocol without intravenous contrast. Multiplanar CT image reconstructions of the cervical spine were also generated. RADIATION DOSE REDUCTION: This exam was performed according to the departmental dose-optimization program which includes automated exposure control, adjustment of the mA and/or kV according to patient size and/or use of iterative reconstruction technique. COMPARISON:  None Available. FINDINGS: CT HEAD FINDINGS Brain: No evidence of large-territorial acute infarction. No parenchymal hemorrhage. No mass lesion. No extra-axial collection. No mass effect or midline shift. No hydrocephalus. Basilar cisterns are patent. Vascular: No hyperdense vessel. Atherosclerotic calcifications are present within the cavernous internal carotid arteries. Skull: No acute fracture or focal lesion. Sinuses/Orbits: Paranasal sinuses and mastoid air cells are clear. The orbits are unremarkable. Other: None. CT CERVICAL SPINE FINDINGS Alignment: Grade 1 anterolisthesis of C2 on C3. Skull base and vertebrae: Multilevel severe degenerative change of the spine. 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 No acute fracture. No aggressive appearing focal osseous  lesion or focal pathologic process. Soft tissues and spinal canal: No prevertebral fluid or swelling. No visible canal hematoma. Upper chest: Biapical paraseptal on sinus changes. Other: None. IMPRESSION: 1. No acute intracranial abnormality. 2. No acute displaced fracture or traumatic listhesis of the cervical spine. 3. 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 Electronically Signed   By: Morgane  Naveau M.D.   On: 04/06/2024 20:59   DG Chest Portable 1 View Result Date: 04/06/2024 CLINICAL DATA:  Fall EXAM: PORTABLE CHEST 1 VIEW COMPARISON:  Chest x-ray 12/17/2023 FINDINGS: Left-sided ICD is present. The heart is enlarged. There are atherosclerotic calcifications of the aorta. There is no focal lung infiltrate, pleural effusion or pneumothorax. There are degenerative changes of both shoulders. IMPRESSION: 1. No  acute cardiopulmonary process. 2. Cardiomegaly. Electronically Signed   By: Greig Pique M.D.   On: 04/06/2024 20:16    Microbiology: Results for orders placed or performed during the hospital encounter of 04/06/24  Resp panel by RT-PCR (RSV, Flu A&B, Covid) Anterior Nasal Swab     Status: None   Collection Time: 04/06/24  8:14 PM   Specimen: Anterior Nasal Swab  Result Value Ref Range Status   SARS Coronavirus 2 by RT PCR NEGATIVE NEGATIVE Final    Comment: (NOTE) SARS-CoV-2 target nucleic acids are NOT DETECTED.  The SARS-CoV-2 RNA is generally detectable in upper respiratory specimens during the acute phase of infection. The lowest concentration of SARS-CoV-2 viral copies this assay can detect is 138 copies/mL. A negative result does not preclude SARS-Cov-2 infection and should not be used as the sole basis for treatment or other patient management decisions. A negative result may occur with  improper specimen collection/handling, submission of specimen other than nasopharyngeal swab, presence of viral mutation(s) within the areas targeted  by this assay, and inadequate number of viral copies(<138 copies/mL). A negative result must be combined with clinical observations, patient history, and epidemiological information. The expected result is Negative.  Fact Sheet for Patients:  BloggerCourse.com  Fact Sheet for Healthcare Providers:  SeriousBroker.it  This test is no t yet approved or cleared by the [place]  FDA and  has been authorized for detection and/or diagnosis of SARS-CoV-2 by FDA under an Emergency Use Authorization (EUA). This EUA will remain  in effect (meaning this test can be used) for the duration of the COVID-19 declaration under Section 564(b)(1) of the Act, 21 U.S.C.section 360bbb-3(b)(1), unless the authorization is terminated  or revoked sooner.       Influenza A by PCR NEGATIVE NEGATIVE Final   Influenza B by PCR NEGATIVE NEGATIVE Final    Comment: (NOTE) The Xpert Xpress SARS-CoV-2/FLU/RSV plus assay is intended as an aid in the diagnosis of influenza from Nasopharyngeal swab specimens and should not be used as a sole basis for treatment. Nasal washings and aspirates are unacceptable for Xpert Xpress SARS-CoV-2/FLU/RSV testing.  Fact Sheet for Patients: BloggerCourse.com  Fact Sheet for Healthcare Providers: SeriousBroker.it  This test is not yet approved or cleared by the [place]  FDA and has been authorized for detection and/or diagnosis of SARS-CoV-2 by FDA under an Emergency Use Authorization (EUA). This EUA will remain in effect (meaning this test can be used) for the duration of the COVID-19 declaration under Section 564(b)(1) of the Act, 21 U.S.C. section 360bbb-3(b)(1), unless the authorization is terminated or revoked.     Resp Syncytial Virus by PCR NEGATIVE NEGATIVE Final    Comment: (NOTE) Fact Sheet for Patients: BloggerCourse.com  Fact  Sheet for Healthcare Providers: SeriousBroker.it  This test is not yet approved or cleared by the [place]  FDA and has been authorized for detection and/or diagnosis of SARS-CoV-2 by FDA under an Emergency Use Authorization (EUA). This EUA will remain in effect (meaning this test can be used) for the duration of the COVID-19 declaration under Section 564(b)(1) of the Act, 21 U.S.C. section 360bbb-3(b)(1), unless the authorization is terminated or revoked.  Performed at [facility], [address] [place], [place] [address]     Labs: CBC: No results for input(s): WBC, NEUTROABS, HGB, HCT, MCV, PLT in the last 168 hours. Basic Metabolic Panel: Recent Labs  Lab 04/11/24 0940 04/13/24 0920 04/16/24 0840  NA 127* 128* 131*  K 3.6 3.5 4.1  CL  96* 96* 94*  CO2 23 26 29   GLUCOSE 97 93 82  BUN 21 9 10   CREATININE 0.71 0.62 0.43*  CALCIUM 8.3* 7.8* 7.9*   Liver Function Tests: No results for input(s): AST, ALT, ALKPHOS, BILITOT, PROT, ALBUMIN in the last 168 hours. CBG: Recent Labs  Lab 04/10/24 1731 04/11/24 0027 04/11/24 0623 04/11/24 1225 04/15/24 0341  GLUCAP 144* 133* 109* 97 84    Discharge time spent: greater than 30 minutes.  Signed: AIDA CHO, MD [facility] 04/17/2024

## 2024-04-17 NOTE — Progress Notes (Incomplete)
 {  Select_TRH_Note:[id number]}

## 2024-04-17 NOTE — Progress Notes (Signed)
 Report called to Bob Ortiz, Charity fundraiser at [employer]. All of her questions and concerns were addressed to her satisfaction. No other concerns at this time. Patient waiting on EMS for discharge to facility.

## 2024-04-17 NOTE — TOC Transition Note (Signed)
 Transition of Care [facility]) - Discharge Note   Patient Details  Name: Bob Ortiz MRN: [id number] Date of Birth: 24-Aug-1940  [facility] CM/SW Contact:  Corean ONEIDA Haddock, RN Phone Number: 04/17/2024, 11:26 AM   Clinical Narrative:     Patient will DC to: [facility] Anticipated DC date: 04/17/24  Family notified: daughter montie, and wife Transport by: Zona  Per MD patient ready for DC to . RN, , patient's family, and facility notified of DC. Discharge Summary sent to facility. RN given number for report. DC packet on chart. Ambulance transport requested for patient.   TOC signing off.         Patient Goals and CMS Choice            Discharge Placement                       Discharge Plan and Services Additional resources added to the After Visit Summary for                                       Social Drivers of Health (SDOH) Interventions SDOH Screenings   Food Insecurity: No Food Insecurity (04/07/2024)  Housing: Low Risk  (04/07/2024)  Transportation Needs: No Transportation Needs (04/07/2024)  Utilities: Not At Risk (04/07/2024)  Financial Resource Strain: Low Risk  (02/08/2024)   [facility]  Physical Activity: Sufficiently Active (11/01/2023)   Received from [facility]  Social Connections: Moderately Isolated (04/07/2024)  Tobacco Use: Medium Risk (04/10/2024)     Readmission Risk Interventions     No data to display

## 2024-05-22 ENCOUNTER — Emergency Department

## 2024-05-22 ENCOUNTER — Encounter: Payer: Self-pay | Admitting: *Deleted

## 2024-05-22 ENCOUNTER — Emergency Department
Admission: EM | Admit: 2024-05-22 | Discharge: 2024-05-22 | Disposition: A | Discharge status: Home / Self Care | Attending: Emergency Medicine | Admitting: Emergency Medicine

## 2024-05-22 ENCOUNTER — Other Ambulatory Visit: Payer: Self-pay

## 2024-05-22 DIAGNOSIS — F0390 Unspecified dementia without behavioral disturbance: Secondary | ICD-10-CM | POA: Insufficient documentation

## 2024-05-22 DIAGNOSIS — S51012A Laceration without foreign body of left elbow, initial encounter: Secondary | ICD-10-CM | POA: Insufficient documentation

## 2024-05-22 DIAGNOSIS — E871 Hypo-osmolality and hyponatremia: Secondary | ICD-10-CM | POA: Insufficient documentation

## 2024-05-22 DIAGNOSIS — I6200 Nontraumatic subdural hemorrhage, unspecified: Secondary | ICD-10-CM | POA: Diagnosis not present

## 2024-05-22 DIAGNOSIS — S51812A Laceration without foreign body of left forearm, initial encounter: Secondary | ICD-10-CM | POA: Insufficient documentation

## 2024-05-22 DIAGNOSIS — W1839XA Other fall on same level, initial encounter: Secondary | ICD-10-CM | POA: Insufficient documentation

## 2024-05-22 DIAGNOSIS — S065X0A Traumatic subdural hemorrhage without loss of consciousness, initial encounter: Secondary | ICD-10-CM | POA: Diagnosis not present

## 2024-05-22 DIAGNOSIS — W19XXXA Unspecified fall, initial encounter: Secondary | ICD-10-CM

## 2024-05-22 LAB — CBC
HCT: 38.3 % — ABNORMAL LOW (ref 39.0–52.0)
Hemoglobin: 12.9 g/dL — ABNORMAL LOW (ref 13.0–17.0)
MCH: 30.1 pg (ref 26.0–34.0)
MCHC: 33.7 g/dL (ref 30.0–36.0)
MCV: 89.3 fL (ref 80.0–100.0)
Platelets: 147 K/uL — ABNORMAL LOW (ref 150–400)
RBC: 4.29 MIL/uL (ref 4.22–5.81)
RDW: 18.8 % — ABNORMAL HIGH (ref 11.5–15.5)
WBC: 6.6 K/uL (ref 4.0–10.5)
nRBC: 0 % (ref 0.0–0.2)

## 2024-05-22 LAB — BASIC METABOLIC PANEL WITH GFR
Anion gap: 9 (ref 5–15)
BUN: 11 mg/dL (ref 8–23)
CO2: 30 mmol/L (ref 22–32)
Calcium: 8.6 mg/dL — ABNORMAL LOW (ref 8.9–10.3)
Chloride: 91 mmol/L — ABNORMAL LOW (ref 98–111)
Creatinine, Ser: 0.47 mg/dL — ABNORMAL LOW (ref 0.61–1.24)
GFR, Estimated: >60 mL/min (ref >60)
Glucose, Bld: 80 mg/dL (ref 70–99)
Potassium: 4.6 mmol/L (ref 3.5–5.1)
Sodium: 130 mmol/L — ABNORMAL LOW (ref 135–145)

## 2024-05-22 LAB — PROTIME-INR
INR: 1.1 (ref 0.8–1.2)
Prothrombin Time: 15.1 s (ref 11.4–15.2)

## 2024-05-22 LAB — TROPONIN I (HIGH SENSITIVITY)
Troponin I (High Sensitivity): 19 ng/L — ABNORMAL HIGH (ref <18)
Troponin I (High Sensitivity): 15 ng/L (ref <18)

## 2024-05-22 NOTE — ED Triage Notes (Signed)
 Pt brought in by ems from home.  Pt fell last night after walking into the dresser.  No loc.  Hx dementia with hallucinations.  Pt has abrasions to left arm   pt on blood thinners.  Hx pacemaker.  Pt alert.

## 2024-05-22 NOTE — ED Notes (Signed)
 Pt's daughter given DC instructions. Daughter verbalized understanding of follow up care. Pt taken from ED in wheelchair by this RN.

## 2024-05-22 NOTE — ED Provider Notes (Signed)
 [facility] Provider Note    Event Date/Time   First MD Initiated Contact with Patient 05/22/24 2001     (approximate)   History   Fall   HPI  Bob Ortiz is a 83 y.o. male who presents to the ED for evaluation of Fall   Review of medical DC summary from last month.  History of paroxysmal A-fib, nonischemic cardiomyopathy, Addison's disease  Patient presents to the ED with his daughter after 1 fall last night around 2 AM.  Family also reports a few days of acute on chronic hallucinations.  History is provided by patient's oldest daughter here in the ED and she lives next-door to the patient at home, he lives with a younger daughter in her home.  Here in the ED oldest daughter reports that he seems fine and at his baseline.    Physical Exam   Triage Vital Signs: ED Triage Vitals  Encounter Vitals Group     BP 05/22/24 1732 109/79     Girls Systolic BP Percentile --      Girls Diastolic BP Percentile --      Boys Systolic BP Percentile --      Boys Diastolic BP Percentile --      Pulse Rate 05/22/24 1732 70     Resp 05/22/24 1732 17     Temp 05/22/24 1732 97.7 F (36.5 C)     Temp Source 05/22/24 1732 Oral     SpO2 05/22/24 1732 100 %     Weight 05/22/24 1744 149 lb 14.6 oz (68 kg)     Height 05/22/24 1744 5' 9 (1.753 m)     Head Circumference --      Peak Flow --      Pain Score 05/22/24 1744 0     Pain Loc --      Pain Education --      Exclude from Growth Chart --     Most recent vital signs: Vitals:   05/22/24 1732  BP: 109/79  Pulse: 70  Resp: 17  Temp: 97.7 F (36.5 C)  SpO2: 100%    General: Awake, no distress.  Pleasantly disoriented, slightly hard of hearing, follows basic commands CV:  Good peripheral perfusion.  Resp:  Normal effort.  Abd:  No distention.  Soft and nontender MSK:  No deformity noted.  Couple small skin tears to the left elbow and distal forearm near the wrist with good active and passive range of  motion throughout without any discrete lacerations to require suture repair. Actively ranging all 4 extremities. After something in bed, with my encouragement he is able to push himself up in bed to reposition by himself. Neuro:  No focal deficits appreciated. Other:     ED Results / Procedures / Treatments   Labs (all labs ordered are listed, but only abnormal results are displayed) Labs Reviewed  BASIC METABOLIC PANEL WITH GFR - Abnormal; Notable for the following components:      Result Value   Sodium 130 (*)    Chloride 91 (*)    Creatinine, Ser 0.47 (*)    Calcium 8.6 (*)    All other components within normal limits  CBC - Abnormal; Notable for the following components:   Hemoglobin 12.9 (*)    HCT 38.3 (*)    RDW 18.8 (*)    Platelets 147 (*)    All other components within normal limits  TROPONIN I (HIGH SENSITIVITY) - Abnormal; Notable for the  following components:   Troponin I (High Sensitivity) 19 (*)    All other components within normal limits  PROTIME-INR  URINALYSIS, W/ REFLEX TO CULTURE (INFECTION SUSPECTED)  TROPONIN I (HIGH SENSITIVITY)    EKG Paced rhythm with a rate of 70 bpm, left bundle morphology without STEMI by Sgarbossa criteria.  RADIOLOGY CT head interpreted by me without evidence of acute intracranial pathology CT cervical spine interpreted by me without evidence of fracture or dislocation  Official radiology report(s): CT Head Wo Contrast Result Date: 05/22/2024 EXAM: CT HEAD WITHOUT CONTRAST 05/22/2024 06:27:00 PM TECHNIQUE: CT of the head was performed without the administration of intravenous contrast. Automated exposure control, iterative reconstruction, and/or weight based adjustment of the mA/kV was utilized to reduce the radiation dose to as low as reasonably achievable. COMPARISON: 04/06/2024 CLINICAL HISTORY: Head trauma, minor (Age >= 65y). Patient to ED via [facility] from home for a fall at 2am. Pt has skin tares to left elbow and hand. Hx  of dementia- family states worse than normal and hallucinations. FINDINGS: BRAIN AND VENTRICLES: No acute hemorrhage. No evidence of acute infarct. No hydrocephalus. No extra-axial collection. No mass effect or midline shift. Partially empty sella. Calcific atherosclerosis. ORBITS: No acute abnormality. SINUSES: No acute abnormality. SOFT TISSUES AND SKULL: No acute soft tissue abnormality. No skull fracture. IMPRESSION: 1. No acute intracranial abnormality related to head trauma. Electronically signed by: Norman Gatlin MD 05/22/2024 06:40 PM EDT RP Workstation: [id number]   CT CERVICAL SPINE WO CONTRAST Result Date: 05/22/2024 EXAM: CT CERVICAL SPINE WITHOUT CONTRAST 05/22/2024 06:27:00 PM TECHNIQUE: CT of the cervical spine was performed without the administration of intravenous contrast. Multiplanar reformatted images are provided for review. Automated exposure control, iterative reconstruction, and/or weight based adjustment of the mA/kV was utilized to reduce the radiation dose to as low as reasonably achievable. COMPARISON: 04/06/2024 CLINICAL HISTORY: Head trauma, minor (Age >= 65y). Patient to ED via [facility] from home for a fall at 2am. Pt has skin tares to left elbow and hand. Hx of dementia- family states worse than normal and hallucinations. FINDINGS: CERVICAL SPINE: BONES AND ALIGNMENT: No acute fracture or traumatic malalignment. DEGENERATIVE CHANGES: Advanced multilevel spondylosis with bulky bridging anterior osteophytes. No severe spinal canal narrowing. SOFT TISSUES: No prevertebral soft tissue swelling. IMPRESSION: 1. No acute abnormality of the cervical spine. Electronically signed by: Norman Gatlin MD 05/22/2024 06:38 PM EDT RP Workstation: [id number]    PROCEDURES and INTERVENTIONS:  Procedures  Medications - No data to display   IMPRESSION / MDM / ASSESSMENT AND PLAN / ED COURSE  I reviewed the triage vital signs and the nursing notes.  Differential diagnosis includes, but is  not limited to, ICH, metabolic encephalopathy, hyponatremia, ACS, polypharmacy  {Patient presents with symptoms of an acute illness or injury that is potentially life-threatening.  Patient presents from home after a fall with some skin tears but no evidence of more severe acute pathology and suitable for outpatient management in the custody of family.  Patient is pleasantly disoriented and has no complaints, couple small skin tears but no more significant trauma on exam.  Reassuring imaging and blood work.  Mild chronic hyponatremia, negative troponins and no chest pain, appropriately paced EKG.  CBC without acute features.  Family is happy to take the patient home and I see no barriers to continued outpatient management.  Discussed close return precautions for the ED.      FINAL CLINICAL IMPRESSION(S) / ED DIAGNOSES   Final diagnoses:  Fall, initial encounter  Rx / DC Orders   ED Discharge Orders     None        Note:  This document was prepared using Dragon voice recognition software and may include unintentional dictation errors.   Claudene Rover, MD 05/22/24 2110

## 2024-05-22 NOTE — ED Triage Notes (Signed)
 First Nurse Note: Patient to ED via [facility] from home for a fall at 2am. Pt has skin tares to left elbow and hand. Hx of dementia- family states worse than normal and hallucinations.   84 cbg 97.5 120/70 72 HR 99% RA

## 2024-05-24 ENCOUNTER — Inpatient Hospital Stay

## 2024-05-24 ENCOUNTER — Other Ambulatory Visit: Payer: Self-pay

## 2024-05-24 ENCOUNTER — Inpatient Hospital Stay
Admission: EM | Admit: 2024-05-24 | Discharge: 2024-06-02 | DRG: 085 | Disposition: A | Discharge status: Skilled Nursing Facility | Attending: Osteopathic Medicine | Admitting: Osteopathic Medicine

## 2024-05-24 ENCOUNTER — Emergency Department

## 2024-05-24 ENCOUNTER — Encounter: Payer: Self-pay | Admitting: Emergency Medicine

## 2024-05-24 DIAGNOSIS — D6832 Hemorrhagic disorder due to extrinsic circulating anticoagulants: Secondary | ICD-10-CM | POA: Diagnosis present

## 2024-05-24 DIAGNOSIS — S065X0A Traumatic subdural hemorrhage without loss of consciousness, initial encounter: Principal | ICD-10-CM | POA: Diagnosis present

## 2024-05-24 DIAGNOSIS — I482 Chronic atrial fibrillation, unspecified: Secondary | ICD-10-CM | POA: Diagnosis present

## 2024-05-24 DIAGNOSIS — R296 Repeated falls: Principal | ICD-10-CM | POA: Diagnosis present

## 2024-05-24 DIAGNOSIS — K5900 Constipation, unspecified: Secondary | ICD-10-CM | POA: Diagnosis not present

## 2024-05-24 DIAGNOSIS — Z66 Do not resuscitate: Secondary | ICD-10-CM | POA: Diagnosis present

## 2024-05-24 DIAGNOSIS — R443 Hallucinations, unspecified: Secondary | ICD-10-CM | POA: Diagnosis present

## 2024-05-24 DIAGNOSIS — I5020 Unspecified systolic (congestive) heart failure: Secondary | ICD-10-CM

## 2024-05-24 DIAGNOSIS — I2489 Other forms of acute ischemic heart disease: Secondary | ICD-10-CM | POA: Diagnosis present

## 2024-05-24 DIAGNOSIS — Z7901 Long term (current) use of anticoagulants: Secondary | ICD-10-CM

## 2024-05-24 DIAGNOSIS — S022XXA Fracture of nasal bones, initial encounter for closed fracture: Secondary | ICD-10-CM | POA: Diagnosis present

## 2024-05-24 DIAGNOSIS — Z515 Encounter for palliative care: Secondary | ICD-10-CM

## 2024-05-24 DIAGNOSIS — R531 Weakness: Secondary | ICD-10-CM | POA: Diagnosis not present

## 2024-05-24 DIAGNOSIS — Z800 Family history of malignant neoplasm of digestive organs: Secondary | ICD-10-CM

## 2024-05-24 DIAGNOSIS — E871 Hypo-osmolality and hyponatremia: Secondary | ICD-10-CM | POA: Diagnosis present

## 2024-05-24 DIAGNOSIS — S066X0A Traumatic subarachnoid hemorrhage without loss of consciousness, initial encounter: Secondary | ICD-10-CM | POA: Diagnosis present

## 2024-05-24 DIAGNOSIS — I110 Hypertensive heart disease with heart failure: Secondary | ICD-10-CM | POA: Diagnosis present

## 2024-05-24 DIAGNOSIS — Z79899 Other long term (current) drug therapy: Secondary | ICD-10-CM

## 2024-05-24 DIAGNOSIS — E039 Hypothyroidism, unspecified: Secondary | ICD-10-CM | POA: Diagnosis present

## 2024-05-24 DIAGNOSIS — I10 Essential (primary) hypertension: Secondary | ICD-10-CM | POA: Diagnosis present

## 2024-05-24 DIAGNOSIS — Z1152 Encounter for screening for COVID-19: Secondary | ICD-10-CM

## 2024-05-24 DIAGNOSIS — E162 Hypoglycemia, unspecified: Secondary | ICD-10-CM | POA: Diagnosis not present

## 2024-05-24 DIAGNOSIS — I5084 End stage heart failure: Secondary | ICD-10-CM | POA: Diagnosis present

## 2024-05-24 DIAGNOSIS — J189 Pneumonia, unspecified organism: Secondary | ICD-10-CM | POA: Diagnosis present

## 2024-05-24 DIAGNOSIS — S0121XA Laceration without foreign body of nose, initial encounter: Secondary | ICD-10-CM | POA: Diagnosis present

## 2024-05-24 DIAGNOSIS — E271 Primary adrenocortical insufficiency: Secondary | ICD-10-CM | POA: Diagnosis present

## 2024-05-24 DIAGNOSIS — T45515A Adverse effect of anticoagulants, initial encounter: Secondary | ICD-10-CM | POA: Diagnosis present

## 2024-05-24 DIAGNOSIS — F03B0 Unspecified dementia, moderate, without behavioral disturbance, psychotic disturbance, mood disturbance, and anxiety: Secondary | ICD-10-CM | POA: Diagnosis present

## 2024-05-24 DIAGNOSIS — I6200 Nontraumatic subdural hemorrhage, unspecified: Secondary | ICD-10-CM | POA: Diagnosis present

## 2024-05-24 DIAGNOSIS — I255 Ischemic cardiomyopathy: Secondary | ICD-10-CM | POA: Diagnosis present

## 2024-05-24 DIAGNOSIS — I5022 Chronic systolic (congestive) heart failure: Secondary | ICD-10-CM | POA: Diagnosis present

## 2024-05-24 DIAGNOSIS — R7989 Other specified abnormal findings of blood chemistry: Secondary | ICD-10-CM | POA: Diagnosis present

## 2024-05-24 DIAGNOSIS — H9190 Unspecified hearing loss, unspecified ear: Secondary | ICD-10-CM | POA: Diagnosis present

## 2024-05-24 DIAGNOSIS — R402362 Coma scale, best motor response, obeys commands, at arrival to emergency department: Secondary | ICD-10-CM | POA: Diagnosis present

## 2024-05-24 DIAGNOSIS — Z8249 Family history of ischemic heart disease and other diseases of the circulatory system: Secondary | ICD-10-CM

## 2024-05-24 DIAGNOSIS — Z87891 Personal history of nicotine dependence: Secondary | ICD-10-CM

## 2024-05-24 DIAGNOSIS — I472 Ventricular tachycardia, unspecified: Secondary | ICD-10-CM | POA: Diagnosis present

## 2024-05-24 DIAGNOSIS — R402242 Coma scale, best verbal response, confused conversation, at arrival to emergency department: Secondary | ICD-10-CM | POA: Diagnosis present

## 2024-05-24 DIAGNOSIS — K219 Gastro-esophageal reflux disease without esophagitis: Secondary | ICD-10-CM | POA: Diagnosis present

## 2024-05-24 DIAGNOSIS — W19XXXA Unspecified fall, initial encounter: Secondary | ICD-10-CM | POA: Diagnosis present

## 2024-05-24 DIAGNOSIS — I428 Other cardiomyopathies: Secondary | ICD-10-CM | POA: Diagnosis present

## 2024-05-24 DIAGNOSIS — Z888 Allergy status to other drugs, medicaments and biological substances status: Secondary | ICD-10-CM

## 2024-05-24 DIAGNOSIS — Z7951 Long term (current) use of inhaled steroids: Secondary | ICD-10-CM

## 2024-05-24 DIAGNOSIS — S0990XA Unspecified injury of head, initial encounter: Secondary | ICD-10-CM | POA: Diagnosis present

## 2024-05-24 DIAGNOSIS — T68XXXA Hypothermia, initial encounter: Secondary | ICD-10-CM | POA: Insufficient documentation

## 2024-05-24 DIAGNOSIS — R402142 Coma scale, eyes open, spontaneous, at arrival to emergency department: Secondary | ICD-10-CM | POA: Diagnosis present

## 2024-05-24 DIAGNOSIS — Z95810 Presence of automatic (implantable) cardiac defibrillator: Secondary | ICD-10-CM

## 2024-05-24 DIAGNOSIS — Z823 Family history of stroke: Secondary | ICD-10-CM

## 2024-05-24 DIAGNOSIS — R680 Hypothermia, not associated with low environmental temperature: Secondary | ICD-10-CM | POA: Diagnosis present

## 2024-05-24 HISTORY — DX: Unspecified atrial fibrillation: I48.91

## 2024-05-24 HISTORY — DX: Unspecified systolic (congestive) heart failure: I50.20

## 2024-05-24 HISTORY — DX: Unspecified dementia, unspecified severity, without behavioral disturbance, psychotic disturbance, mood disturbance, and anxiety: F03.90

## 2024-05-24 LAB — BASIC METABOLIC PANEL WITH GFR
Anion gap: 9 (ref 5–15)
BUN: 16 mg/dL (ref 8–23)
CO2: 26 mmol/L (ref 22–32)
Calcium: 8.8 mg/dL — ABNORMAL LOW (ref 8.9–10.3)
Chloride: 91 mmol/L — ABNORMAL LOW (ref 98–111)
Creatinine, Ser: 0.71 mg/dL (ref 0.61–1.24)
GFR, Estimated: >60 mL/min (ref >60)
Glucose, Bld: 82 mg/dL (ref 70–99)
Potassium: 5.4 mmol/L — ABNORMAL HIGH (ref 3.5–5.1)
Sodium: 126 mmol/L — ABNORMAL LOW (ref 135–145)

## 2024-05-24 LAB — CBC WITH DIFFERENTIAL/PLATELET
Abs Immature Granulocytes: 0.01 K/uL (ref 0.00–0.07)
Basophils Absolute: 0 K/uL (ref 0.0–0.1)
Basophils Relative: 1 %
Eosinophils Absolute: 0 K/uL (ref 0.0–0.5)
Eosinophils Relative: 1 %
HCT: 35.8 % — ABNORMAL LOW (ref 39.0–52.0)
Hemoglobin: 12 g/dL — ABNORMAL LOW (ref 13.0–17.0)
Immature Granulocytes: 0 %
Lymphocytes Relative: 23 %
Lymphs Abs: 1.1 K/uL (ref 0.7–4.0)
MCH: 29.9 pg (ref 26.0–34.0)
MCHC: 33.5 g/dL (ref 30.0–36.0)
MCV: 89.1 fL (ref 80.0–100.0)
Monocytes Absolute: 0.5 K/uL (ref 0.1–1.0)
Monocytes Relative: 11 %
Neutro Abs: 3.1 K/uL (ref 1.7–7.7)
Neutrophils Relative %: 64 %
Platelets: 129 K/uL — ABNORMAL LOW (ref 150–400)
RBC: 4.02 MIL/uL — ABNORMAL LOW (ref 4.22–5.81)
RDW: 18.6 % — ABNORMAL HIGH (ref 11.5–15.5)
WBC: 4.9 K/uL (ref 4.0–10.5)
nRBC: 0 % (ref 0.0–0.2)

## 2024-05-24 LAB — RESP PANEL BY RT-PCR (RSV, FLU A&B, COVID)  RVPGX2
Influenza A by PCR: NEGATIVE
Influenza B by PCR: NEGATIVE
Resp Syncytial Virus by PCR: NEGATIVE
SARS Coronavirus 2 by RT PCR: NEGATIVE

## 2024-05-24 LAB — URINALYSIS, W/ REFLEX TO CULTURE (INFECTION SUSPECTED)
Bacteria, UA: NONE SEEN
Bilirubin Urine: NEGATIVE
Glucose, UA: NEGATIVE mg/dL
Ketones, ur: NEGATIVE mg/dL
Leukocytes,Ua: NEGATIVE
Nitrite: NEGATIVE
Protein, ur: NEGATIVE mg/dL
Specific Gravity, Urine: 1.011 (ref 1.005–1.030)
Squamous Epithelial / HPF: 0 /HPF (ref 0–5)
WBC, UA: 0 WBC/hpf (ref 0–5)
pH: 6 (ref 5.0–8.0)

## 2024-05-24 LAB — MAGNESIUM: Magnesium: 1.8 mg/dL (ref 1.7–2.4)

## 2024-05-24 LAB — COMPREHENSIVE METABOLIC PANEL WITH GFR
ALT: 43 U/L (ref 0–44)
AST: 43 U/L — ABNORMAL HIGH (ref 15–41)
Albumin: 3.2 g/dL — ABNORMAL LOW (ref 3.5–5.0)
Alkaline Phosphatase: 110 U/L (ref 38–126)
Anion gap: 11 (ref 5–15)
BUN: 13 mg/dL (ref 8–23)
CO2: 27 mmol/L (ref 22–32)
Calcium: 8.5 mg/dL — ABNORMAL LOW (ref 8.9–10.3)
Chloride: 94 mmol/L — ABNORMAL LOW (ref 98–111)
Creatinine, Ser: 0.52 mg/dL — ABNORMAL LOW (ref 0.61–1.24)
GFR, Estimated: >60 mL/min (ref >60)
Glucose, Bld: 65 mg/dL — ABNORMAL LOW (ref 70–99)
Potassium: 4.2 mmol/L (ref 3.5–5.1)
Sodium: 132 mmol/L — ABNORMAL LOW (ref 135–145)
Total Bilirubin: 0.9 mg/dL (ref 0.0–1.2)
Total Protein: 6.5 g/dL (ref 6.5–8.1)

## 2024-05-24 LAB — CK: Total CK: 84 U/L (ref 49–397)

## 2024-05-24 LAB — CBG MONITORING, ED
Glucose-Capillary: 128 mg/dL — ABNORMAL HIGH (ref 70–99)
Glucose-Capillary: 57 mg/dL — ABNORMAL LOW (ref 70–99)
Glucose-Capillary: 72 mg/dL (ref 70–99)
Glucose-Capillary: 83 mg/dL (ref 70–99)

## 2024-05-24 LAB — TROPONIN I (HIGH SENSITIVITY)
Troponin I (High Sensitivity): 19 ng/L — ABNORMAL HIGH (ref <18)
Troponin I (High Sensitivity): 21 ng/L — ABNORMAL HIGH (ref <18)

## 2024-05-24 LAB — MRSA NEXT GEN BY PCR, NASAL: MRSA by PCR Next Gen: NOT DETECTED

## 2024-05-24 LAB — LACTIC ACID, PLASMA
Lactic Acid, Venous: 0.9 mmol/L (ref 0.5–1.9)
Lactic Acid, Venous: 0.7 mmol/L (ref 0.5–1.9)

## 2024-05-24 LAB — CORTISOL: Cortisol, Plasma: 11.1 ug/dL

## 2024-05-24 LAB — TSH: TSH: 2.844 u[IU]/mL (ref 0.350–4.500)

## 2024-05-24 MED ORDER — ONDANSETRON HCL 4 MG/2ML IJ SOLN: 4.0000 mg | Freq: Four times a day (QID) | INTRAMUSCULAR | Status: AC | PRN

## 2024-05-24 MED ORDER — PREDNISONE 10 MG PO TABS
5.0000 mg | ORAL_TABLET | Freq: Every day | ORAL | Status: DC
  Administered 2024-05-24: 5 mg via ORAL
  Filled 2024-05-24: qty 1

## 2024-05-24 MED ORDER — DEXTROSE 50 % IV SOLN
INTRAVENOUS | Status: AC
  Administered 2024-05-24: 50 mL via INTRAVENOUS
  Filled 2024-05-24: qty 50

## 2024-05-24 MED ORDER — SODIUM CHLORIDE 0.9 % IV SOLN
500.0000 mg | INTRAVENOUS | Status: DC
  Filled 2024-05-24: qty 5

## 2024-05-24 MED ORDER — SODIUM CHLORIDE 0.9 % IV SOLN
1.0000 g | Freq: Once | INTRAVENOUS | Status: AC
  Administered 2024-05-24: 1 g via INTRAVENOUS
  Filled 2024-05-24: qty 10

## 2024-05-24 MED ORDER — DEXTROSE 50 % IV SOLN: 1.0000 | Freq: Once | INTRAVENOUS | Status: AC

## 2024-05-24 MED ORDER — METOPROLOL TARTRATE 5 MG/5ML IV SOLN: 5.0000 mg | Freq: Once | INTRAVENOUS | Status: DC

## 2024-05-24 MED ORDER — SENNOSIDES-DOCUSATE SODIUM 8.6-50 MG PO TABS: 1.0000 | ORAL_TABLET | Freq: Every evening | ORAL | Status: DC | PRN

## 2024-05-24 MED ORDER — ACETAMINOPHEN 650 MG RE SUPP: 650.0000 mg | Freq: Four times a day (QID) | RECTAL | Status: DC | PRN

## 2024-05-24 MED ORDER — CHLORHEXIDINE GLUCONATE CLOTH 2 % EX PADS
6.0000 | MEDICATED_PAD | Freq: Every day | CUTANEOUS | Status: DC
  Administered 2024-05-24 – 2024-05-25 (×2): 6 via TOPICAL

## 2024-05-24 MED ORDER — LACTATED RINGERS IV BOLUS
1000.0000 mL | Freq: Once | INTRAVENOUS | Status: AC
  Administered 2024-05-24: 1000 mL via INTRAVENOUS

## 2024-05-24 MED ORDER — SODIUM CHLORIDE 0.9 % IV SOLN
2.0000 g | INTRAVENOUS | Status: DC
  Filled 2024-05-24: qty 20

## 2024-05-24 MED ORDER — DEXTROSE 50 % IV SOLN: 1.0000 | INTRAVENOUS | Status: AC | PRN

## 2024-05-24 MED ORDER — QUETIAPINE FUMARATE 25 MG PO TABS
12.5000 mg | ORAL_TABLET | Freq: Every day | ORAL | Status: DC
  Administered 2024-05-24 – 2024-06-01 (×9): 12.5 mg via ORAL
  Filled 2024-05-24 (×9): qty 1

## 2024-05-24 MED ORDER — ONDANSETRON HCL 4 MG PO TABS
4.0000 mg | ORAL_TABLET | Freq: Four times a day (QID) | ORAL | Status: AC | PRN
Start: 2024-05-24 — End: 2024-05-29

## 2024-05-24 MED ORDER — ACETAMINOPHEN 325 MG PO TABS: 650.0000 mg | ORAL_TABLET | Freq: Four times a day (QID) | ORAL | Status: DC | PRN

## 2024-05-24 MED ORDER — VITAMIN B-12 1000 MCG PO TABS
1000.0000 ug | ORAL_TABLET | Freq: Every day | ORAL | Status: DC
  Administered 2024-05-26 – 2024-06-02 (×8): 1000 ug via ORAL
  Filled 2024-05-24 (×8): qty 1

## 2024-05-24 MED ORDER — TETANUS-DIPHTH-ACELL PERTUSSIS 5-2-15.5 LF-MCG/0.5 IM SUSP
0.5000 mL | Freq: Once | INTRAMUSCULAR | Status: AC
  Administered 2024-05-24: 0.5 mL via INTRAMUSCULAR
  Filled 2024-05-24 (×2): qty 0.5

## 2024-05-24 MED ORDER — HYDRALAZINE HCL 20 MG/ML IJ SOLN: 5.0000 mg | Freq: Four times a day (QID) | INTRAMUSCULAR | Status: DC | PRN

## 2024-05-24 MED ORDER — SODIUM CHLORIDE 0.9 % IV SOLN
500.0000 mg | Freq: Once | INTRAVENOUS | Status: AC
  Administered 2024-05-24: 500 mg via INTRAVENOUS
  Filled 2024-05-24: qty 5

## 2024-05-24 NOTE — Assessment & Plan Note (Signed)
 Atypical pneumonia versus viral infection Azithromycin 500 mg IV daily, ceftriaxone 2 g IV daily to complete a 5-day course Incentive spirometry, flutter valve

## 2024-05-24 NOTE — Assessment & Plan Note (Signed)
 At baseline

## 2024-05-24 NOTE — Assessment & Plan Note (Signed)
 Suspect demand ischemia in setting of pulmonary edema with possible atypical pneumonia Strict I's and O's

## 2024-05-24 NOTE — Assessment & Plan Note (Signed)
 Home prednisone  5 mg daily with breakfast resume

## 2024-05-24 NOTE — Assessment & Plan Note (Addendum)
 Versus subarachnoid hemorrhage suspect secondary to mechanical fall Home Eliquis  will not be resumed on admission No DVT from neurologic prophylaxis Hydralazine  5 mg IV every 6 hours as needed for SBP > 155, 5 days ordered Neurosurgery has been consulted, appreciate recommendation and guidance Repeat CT head without contrast ordered by EDP for day of admission at 1400 hrs. Fall precaution

## 2024-05-24 NOTE — Progress Notes (Signed)
       CROSS COVER NOTE  NAME: Bob Ortiz MRN: [id number] DOB : [birth year]    Concern as stated by nurse / staff   Hi Dr. Cleatus. [name] is having multiple runs of VTACH. He just had a 14 beat run non sustained but they are becoming more frequent. I just did an EKG and has been exported to his chart. He was admitted today as a result of a fall from home. CT scan showed subdural hematoma and as of now they are just monitoring him. All other vitals are stable.      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 X

## 2024-05-24 NOTE — ED Provider Notes (Signed)
 [facility] Provider Note    Event Date/Time   First MD Initiated Contact with Patient 05/24/24 0701     (approximate)   History   Chief Complaint Fall   HPI  Bob Ortiz is a 83 y.o. male with past medical history of hypertension, atrial fibrillation on Eliquis , nonischemic cardiomyopathy, CHF, Addison's disease, and dementia who presents to the ED complaining of fall.  Patient lives with his younger daughter, who states that she heard a thud from his room, found the patient had fallen and hit his head on the doorway.  She does not think he lost consciousness as he seemed to be immediately groaning in pain.  She did notice abrasions to the bridge of his nose and his forehead.  Patient unable to provide significant history due to dementia, daughter also reports that patient has been increasingly confused over the past few days.  She states that he has seemed weaker than usual, crawling around the house at times to get around with increased hallucinations, agitation, and confusion.  She is not aware of any fevers, but he has had a cough.  He has been eating and drinking normally with no vomiting or diarrhea.     Physical Exam   Triage Vital Signs: ED Triage Vitals  Encounter Vitals Group     BP 05/24/24 0628 108/73     Girls Systolic BP Percentile --      Girls Diastolic BP Percentile --      Boys Systolic BP Percentile --      Boys Diastolic BP Percentile --      Pulse Rate 05/24/24 0628 70     Resp 05/24/24 0628 12     Temp 05/24/24 0630 (!) 93.6 F (34.2 C)     Temp Source 05/24/24 0628 Rectal     SpO2 05/24/24 0628 100 %     Weight 05/24/24 0628 168 lb 6.9 oz (76.4 kg)     Height 05/24/24 0628 5' 9 (1.753 m)     Head Circumference --      Peak Flow --      Pain Score --      Pain Loc --      Pain Education --      Exclude from Growth Chart --     Most recent vital signs: Vitals:   05/24/24 0900 05/24/24 0930  BP: 117/74 112/73  Pulse:  70 70  Resp: 12 11  Temp:    SpO2: 97% 96%    Constitutional: Alert and oriented to person, but not place, time, or situation. Eyes: Conjunctivae are normal. Head: Atraumatic. Nose: No congestion/rhinnorhea. Mouth/Throat: Mucous membranes are moist.  Neck: No midline cervical spine tenderness to palpation. Cardiovascular: Normal rate, regular rhythm. Grossly normal heart sounds.  2+ radial pulses bilaterally. Respiratory: Normal respiratory effort.  No retractions. Lungs CTAB.  No chest wall tenderness to palpation. Gastrointestinal: Soft and nontender. No distention. Musculoskeletal: No lower extremity tenderness nor edema.  No upper extremity bony tenderness to palpation. Neurologic: No gross focal neurologic deficits are appreciated.    ED Results / Procedures / Treatments   Labs (all labs ordered are listed, but only abnormal results are displayed) Labs Reviewed  CBC WITH DIFFERENTIAL/PLATELET - Abnormal; Notable for the following components:      Result Value   RBC 4.02 (*)    Hemoglobin 12.0 (*)    HCT 35.8 (*)    RDW 18.6 (*)    Platelets 129 (*)  All other components within normal limits  COMPREHENSIVE METABOLIC PANEL WITH GFR - Abnormal; Notable for the following components:   Sodium 132 (*)    Chloride 94 (*)    Glucose, Bld 65 (*)    Creatinine, Ser 0.52 (*)    Calcium 8.5 (*)    Albumin 3.2 (*)    AST 43 (*)    All other components within normal limits  URINALYSIS, W/ REFLEX TO CULTURE (INFECTION SUSPECTED) - Abnormal; Notable for the following components:   Color, Urine YELLOW (*)    APPearance CLEAR (*)    Hgb urine dipstick SMALL (*)    All other components within normal limits  TROPONIN I (HIGH SENSITIVITY) - Abnormal; Notable for the following components:   Troponin I (High Sensitivity) 19 (*)    All other components within normal limits  TROPONIN I (HIGH SENSITIVITY) - Abnormal; Notable for the following components:   Troponin I (High  Sensitivity) 21 (*)    All other components within normal limits  CULTURE, BLOOD (ROUTINE X 2)  CULTURE, BLOOD (ROUTINE X 2)  RESP PANEL BY RT-PCR (RSV, FLU A&B, COVID)  [id number]  LACTIC ACID, PLASMA  LACTIC ACID, PLASMA  TSH  CK  CORTISOL     EKG  ED ECG REPORT I, Carlin Palin, the attending physician, personally viewed and interpreted this ECG.   Date: 05/24/2024  EKG Time: 6:27  Rate: 70  Rhythm: Ventricular paced rhythm  Axis: LAD  Intervals:nonspecific intraventricular conduction delay  ST&T Change: None  RADIOLOGY Chest x-ray reviewed and interpreted by me with left pleural effusion.  PROCEDURES:  Critical Care performed: Yes, see critical care procedure note(s)  Procedures   MEDICATIONS ORDERED IN ED: Medications  azithromycin (ZITHROMAX) 500 mg in sodium chloride  0.9 % 250 mL IVPB (500 mg Intravenous New Bag/Given 05/24/24 0943)  Tdap (ADACEL) injection 0.5 mL (0.5 mLs Intramuscular Given 05/24/24 0826)  lactated ringers  bolus 1,000 mL (0 mLs Intravenous Stopped 05/24/24 0903)  cefTRIAXone (ROCEPHIN) 1 g in sodium chloride  0.9 % 100 mL IVPB (0 g Intravenous Stopped 05/24/24 0942)  dextrose  50 % solution 50 mL (50 mLs Intravenous Given 05/24/24 0941)     IMPRESSION / MDM / ASSESSMENT AND PLAN / ED COURSE  I reviewed the triage vital signs and the nursing notes.                              83 y.o. male with past medical history of hypertension, atrial fibrillation on Eliquis , nonischemic cardiomyopathy, CHF, Addison's disease, and dementia who presents to the ED for fall last evening in the setting of increasing generalized weakness for the past 4 to 5 days.  Patient's presentation is most consistent with acute presentation with potential threat to life or bodily function.  Differential diagnosis includes, but is not limited to, intracranial injury, cervical spine injury, facial fracture, sepsis, pneumonia, UTI, anemia, electrolyte abnormality, AKI,  dehydration, adrenal crisis.  Patient chronically ill-appearing but nontoxic and in no acute distress, vital signs remarkable for hypothermia at 93.6 and patient was placed on [employer]  He is disoriented compared to his baseline but has a nonfocal neurologic exam, CT head, cervical spine, and facial are pending.  Sepsis workup initiated due to hypothermia, chest x-ray with questionable left lower lobe infiltrate and urinalysis pending.  Labs thus far without significant anemia or leukocytosis, additional labs are pending.  We will hydrate with IV fluids and reassess following additional  labs and imaging.  CT head concerning for trace subdural hematoma without midline shift.  Findings reviewed with Dr. Claudene of neurosurgery, who recommends repeat CT imaging in 6 hours but no intervention planned at this time and he will evaluate the patient.  Labs show mild hyperglycemia, downtrending on recheck and will give amp of D50.  No significant anemia or leukocytosis noted, renal function unremarkable.  Troponin mildly elevated but stable on recheck and lactic acid within normal limits.  We will treat for possible pneumonia, case discussed with hospitalist for admission.      FINAL CLINICAL IMPRESSION(S) / ED DIAGNOSES   Final diagnoses:  Multiple falls  Generalized weakness  Hypothermia, initial encounter  Hypoglycemia     Rx / DC Orders   ED Discharge Orders     None        Note:  This document was prepared using Dragon voice recognition software and may include unintentional dictation errors.   Willo Dunnings, MD 05/24/24 1018

## 2024-05-24 NOTE — Consult Note (Addendum)
 Consulting Department:  ED  Primary Physician:  Salli Amato, MD  Chief Complaint:  TBI  History of Present Illness: 05/24/2024 Bob Ortiz is a 83 y.o. male who presents with the chief complaint of multiple recent falls.  2 within the past 3 days.  Original had a posterior scalp trauma, this 1 had an anterior scalp trauma.  He is brought in.  He does complain of a headache.  He has had previous seizures but not had any seizure-like activity at this time.  No evidence of CSF leak. 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  Review of Systems:  A 10 point review of systems is negative, except for the pertinent positives and negatives detailed in the HPI.  Past Medical History: Past Medical History:  Diagnosis Date   Dementia [facility])     Past Surgical History: Past Surgical History:  Procedure Laterality Date   FRACTURE SURGERY Right    shoulder    Allergies: Allergies as of 05/24/2024 - Review Complete 05/24/2024  Allergen Reaction Noted   Quinapril Swelling 10/29/2021   Bumetanide  Other (See Comments) 10/26/2023   Spironolactone Other (See Comments) 10/26/2023   Torsemide  Other (See Comments) 10/26/2023    Medications:  Current Facility-Administered Medications:    azithromycin (ZITHROMAX) 500 mg in sodium chloride  0.9 % 250 mL IVPB, 500 mg, Intravenous, Once, Willo Dunnings, MD   cefTRIAXone (ROCEPHIN) 1 g in sodium chloride  0.9 % 100 mL IVPB, 1 g, Intravenous, Once, Willo Dunnings, MD, Last Rate: 200 mL/hr at 05/24/24 0902, 1 g at 05/24/24 0902  Current Outpatient Medications:    carvedilol  (COREG ) 3.125 MG tablet, Take 3.125 mg by mouth in the morning and at bedtime. (Patient not taking: Reported on 12/13/2023), Disp: , Rfl:    Cholecalciferol  (VITAMIN D -1000 MAX ST) 25 MCG (1000 UT) tablet, Take 1,000 Units by mouth daily., Disp: , Rfl:    cyanocobalamin  (VITAMIN B12) 1000 MCG tablet, Take 1,000 mcg by mouth  daily., Disp: , Rfl:    ELIQUIS  5 MG TABS tablet, Take 5 mg by mouth 2 (two) times daily., Disp: , Rfl:    magnesium  oxide (MAG-OX) 400 (240 Mg) MG tablet, Take 2 tablets by mouth daily., Disp: , Rfl:    predniSONE  (DELTASONE ) 5 MG tablet, Take 1 tablet (5 mg total) by mouth daily with breakfast., Disp: , Rfl:    QUEtiapine  (SEROQUEL ) 25 MG tablet, Take 0.5 tablets (12.5 mg total) by mouth at bedtime., Disp: , Rfl:    torsemide  (DEMADEX ) 20 MG tablet, Take 1 tablet (20 mg total) by mouth daily as needed (for swelling). Take 1 tablet (20 mg total) by mouth once daily Take an extra 20 mg in the afternoon as needed, Disp: , Rfl:    Social History: Social History   Tobacco Use   Smoking status: Former    Current packs/day: 0.00    Types: Cigarettes    Quit date: 1975    Years since quitting: 50.8   Smokeless tobacco: Never  Vaping Use   Vaping status: Never Used  Substance Use Topics   Alcohol use: Not Currently    Comment: occassionally   Drug use: Never    Family Medical History: Family History  Problem Relation Age of Onset   Stomach cancer Mother    Heart attack Father 8   Stroke Father     Physical Examination: Vitals:   05/24/24 0628 05/24/24 0630  BP: 108/73   Pulse: 70  Resp: 12   Temp:  (!) 93.6 F (34.2 C)  SpO2: 100%      General: Patient is well developed, well nourished, calm, collected, and in no apparent distress.  NEUROLOGICAL:  General: In no acute distress.   Oriented to person and place.  Pupils equal round and reactive to light.  Full Facial tone is symmetric.  Tongue protrusion is midline.  Bilateral upper extremities are full strength proximally and distally.  There is no pronator drift.  Language is conversant.  GCS: He does mumble while speaking but opens his eyes to voice, follows commands in all 4 extremities.  GCS 13.   Bilateral upper and lower extremity sensation is intact to light touch.  Imaging: CT Cervical Spine Wo  Contrast Result Date: 05/24/2024 EXAM: CT CERVICAL SPINE WITHOUT CONTRAST 05/24/2024 07:57:12 AM TECHNIQUE: CT of the cervical spine was performed without the administration of intravenous contrast. Multiplanar reformatted images are provided for review. Automated exposure control, iterative reconstruction, and/or weight based adjustment of the mA/kV was utilized to reduce the radiation dose to as low as reasonably achievable. COMPARISON: Cervical spine CT 05/22/2024. CT head and face 05/24/2024 reported separately. CLINICAL HISTORY: 83 year old male with neck trauma after a fall, on Eliquis , recent ED visit 05/22/2024 for fall and weakness. FINDINGS: CERVICAL SPINE: BONES AND ALIGNMENT: No acute fracture or traumatic malalignment. 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 Developing cervical interbody ankylosis (appears solid at C6-C7). Chronic anterior C1-C2 degeneration including partially calcified ligamentous hypertrophy. SOFT TISSUES: No prevertebral soft tissue swelling. Introducing genius band partially visible left chest cardiac pacemaker type device. LUNGS: Asymmetric upper lung pulmonary ground glass opacity greater on the left is new. IMPRESSION: 1. No acute traumatic injury identified in the cervical spine. 2. Asymmetric upper lung ground-glass opacity is new, consider mild or developing pulmonary edema, viral or atypical respiratory infection. 3. Stable diffuse hyperostosis and cervical spine degeneration. Electronically signed by: Helayne Hurst MD 05/24/2024 08:15 AM EDT RP Workstation: [id number]   CT Maxillofacial Wo Contrast Result Date: 05/24/2024 EXAM: CT OF THE FACE WITHOUT CONTRAST 05/24/2024 07:57:12 AM TECHNIQUE: CT of the face was performed without the administration of intravenous contrast. Multiplanar reformatted images are provided for review. Automated exposure control, iterative reconstruction, and/or weight  based adjustment of the mA/kV was utilized to reduce the radiation dose to as low as reasonably achievable. COMPARISON: CT reported separately today. CLINICAL HISTORY: 83 year old male with blunt facial trauma, laceration to nasal bridge, and history of falls. Patient on Eliquis . FINDINGS: LIMITATIONS/ARTIFACTS: Mild motion artifact. FACIAL BONES: Minimally displaced left nasal bone fracture. Carious left mandible anterior molar, right mandible medial incisor. No mandibular dislocation. No suspicious bone lesion. ORBITS: Globes are intact. No acute traumatic injury. No inflammatory change. SINUSES AND MASTOIDS: Paranasal sinuses, middle ears and mastoids remain clear. SOFT TISSUES: Overlying bilateral nasal bridge soft tissue swelling and trace posttraumatic soft tissue gas on the left. Calcified cervical carotid atherosclerosis. Postinflammatory tonsillar pillar calcifications. IMPRESSION: 1. Minimally displaced left nasal bone fractures with overlying soft tissue injury . 2. No other acute traumatic injury identified in the Face. 3. Mandible dental caries. Electronically signed by: Helayne Hurst MD 05/24/2024 08:10 AM EDT RP Workstation: [id number]   CT HEAD WO CONTRAST ( ) Result Date: 05/24/2024 EXAM: CT HEAD WITHOUT CONTRAST 05/24/2024 07:57:12 AM TECHNIQUE: CT of the head was performed without the administration of intravenous contrast. Automated exposure control, iterative reconstruction, and/or weight based adjustment of the mA/kV was utilized  to reduce the radiation dose to as low as reasonably achievable. COMPARISON: Face CT reported separately today. CT 05/22/2024. CLINICAL HISTORY: 83 year old male with head trauma after a fall, found on floor. Laceration to bridge of nose. On Eliquis . Recent fall 05/22/24. FINDINGS: BRAIN AND VENTRICLES: Trace new parafalcine subdural or subarachnoid blood best seen on coronal images series 4 image 33 compared to coronal image 31 on the recent exam. No other acute  intracranial hemorrhage identified. No evidence of acute infarct. No hydrocephalus. No extra-axial collection. No mass effect or midline shift. 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 ORBITS: No acute abnormality. SINUSES: Paranasal sinuses and mastoids remain well aerated. SOFT TISSUES AND SKULL: New nasal bone fractures and overlying soft tissue swelling and soft tissue gas, reported on Face CT separately. Traumatic Brain Injury Risk Stratification ----- Low - mBIG 1 IMPRESSION: 1. Trace new parafalcine subdural or subarachnoid hemorrhage. No midline shift or complicating features. 2. Nasal injury, See Face CT. Electronically signed by: Helayne Hurst MD 05/24/2024 08:07 AM EDT RP Workstation: [id number]   DG Chest Portable 1 View Result Date: 05/24/2024 EXAM: 1 VIEW(S) XRAY OF THE CHEST 05/24/2024 07:03:00 AM COMPARISON: 04/06/2024 CLINICAL HISTORY: Hypothermia, eval for sepsis. Hypothermia, eval for sepsis. FINDINGS: LINES, TUBES AND DEVICES: Left chest wall pacer device is identified with 2 leads overlying the right ventricle. Unchanged in appearance from the previous exam. LUNGS AND PLEURA: Low lung volumes. Left basilar opacity. Small left pleural effusion. No pulmonary edema. No pneumothorax. HEART AND MEDIASTINUM: Cardiomegaly. Aortic atherosclerosis. BONES AND SOFT TISSUES: Bilateral shoulder degenerative change. IMPRESSION: 1. Left basilar opacity which is favored to represent a combination of pleural effusion and atelectasis. Underlying airspace disease not excluded. 2. 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 Automated exposure control, iterative reconstruction, and/or  weight based adjustment of the mA/kV was utilized to reduce the radiation dose to as low as reasonably achievable. COMPARISON: 04/06/2024 CLINICAL HISTORY: Head trauma, minor (Age >= 65y). Patient to ED via [facility] from home for a fall at 2am. Pt has skin tares to left elbow and hand. Hx of dementia- family states worse than normal and hallucinations. FINDINGS: BRAIN AND VENTRICLES: No acute hemorrhage. No evidence of acute infarct. No hydrocephalus. No extra-axial collection. No mass effect or midline shift. Partially empty sella. Calcific atherosclerosis. ORBITS: No acute abnormality. SINUSES: No acute abnormality. SOFT TISSUES AND SKULL: No acute soft tissue abnormality. No skull fracture. IMPRESSION: 1. No acute intracranial abnormality related to head trauma. Electronically signed by: Norman Gatlin MD 05/22/2024 06:40 PM EDT RP Workstation: [id number]   CT CERVICAL SPINE WO CONTRAST Result Date: 05/22/2024 EXAM: CT CERVICAL SPINE WITHOUT CONTRAST 05/22/2024 06:27:00 PM TECHNIQUE: CT of the cervical spine was performed without the administration of intravenous contrast. Multiplanar reformatted images are provided for review. Automated exposure control, iterative reconstruction, and/or weight based adjustment of the mA/kV was utilized to reduce the radiation dose to as low as reasonably achievable. COMPARISON: 04/06/2024 CLINICAL HISTORY: Head trauma, minor (Age >= 65y). Patient to ED via [facility] from home for a fall at 2am. Pt has skin tares to left elbow and hand. Hx of dementia- family states worse than normal and hallucinations. FINDINGS: CERVICAL SPINE: BONES AND ALIGNMENT: No acute fracture  or traumatic malalignment. DEGENERATIVE CHANGES: Advanced multilevel spondylosis with bulky bridging anterior osteophytes. No severe spinal canal narrowing. SOFT TISSUES: No prevertebral soft tissue swelling. IMPRESSION: 1. No acute abnormality of the cervical spine. Electronically signed by: Norman Gatlin MD  05/22/2024 06:38 PM EDT RP Workstation: [id number]     I have personally reviewed the images and agree with the above interpretation.  Labs:    Latest Ref Rng & Units 05/24/2024    6:34 AM 05/22/2024    5:47 PM 04/09/2024    8:53 AM  CBC  WBC 4.0 - 10.5 K/uL 4.9  6.6  5.3   Hemoglobin 13.0 - 17.0 g/dL 87.9  87.0  88.4   Hematocrit 39.0 - 52.0 % 35.8  38.3  33.5   Platelets 150 - 400 K/uL 129  147  190       Latest Ref Rng & Units 05/22/2024    5:47 PM 04/16/2024    8:40 AM 04/13/2024    9:20 AM  BMP  Glucose 70 - 99 mg/dL 80  82  93   BUN 8 - 23 mg/dL 11  10  9    Creatinine 0.61 - 1.24 mg/dL 9.52  9.56  9.37   Sodium 135 - 145 mmol/L 130  131  128   Potassium 3.5 - 5.1 mmol/L 4.6  4.1  3.5   Chloride 98 - 111 mmol/L 91  94  96   CO2 22 - 32 mmol/L 30  29  26    Calcium 8.9 - 10.3 mg/dL 8.6  7.9  7.8     INR  1.1 (10/20 1747)   Assessment and Plan: Mr. Matsuo is a pleasant 83 y.o. male with history of multiple recent falls.  He has a nasal fracture.  No evidence of skull fracture.  He has trace subarachnoid and subdural hemorrhage noted on his CT scan.  No evidence of compressive nature of these findings.  He had a previous trauma 3 days ago and another 1 this morning.  On physical examination he is nonfocal on his examination does have a history of dementia and is slightly worse than his normal baseline.  At this point I would not start any new antiseizure medication given his age and dementia.  I would watch him closely.  Would consult with neurology if he has any seizure-like activity.  For his bleeding this does not appear to need surgical intervention.  I would like to follow-up with a repeat head CT in approximately 6 hours to evaluate whether or not there is any expansion.  However the parafalcine nature of this bleed is low risk overall from a neurosurgical standpoint.  Penne MICAEL Sharps, MD/MSCR Dept. of Neurosurgery   Addendum: 05/24/2024  Repeat head CT was stable  without any evidence of active expansion.  No need for routine follow-up head CT during this hospitalization.  Will follow-up in clinic in 2 to 4 weeks with repeat head CT.

## 2024-05-24 NOTE — Assessment & Plan Note (Signed)
 Status post D50 1 amp per EDP Recheck CBG, CBG monitoring every 2 hours, 4 occurrences ordered

## 2024-05-24 NOTE — H&P (Signed)
 History and Physical   [name]:[id number] DOB: 11/06/1940 DOA: 05/24/2024  PCP: Salli Amato, MD  Outpatient Specialists: Dr. Selinda Sermon, [facility] cardiac electrophysiology specialist Patient coming from: Home via EMS  I have personally briefly reviewed patient's old medical records in [facility] EMR.  Chief Concern: Mechanical fall, altered mental status  HPI: 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  Vitals in the ED showed t of 93.6, rr of 12, hr 70, blood pressure 108/73, SpO2 100% on room air.  Serum sodium is 132, potassium 0.2, chloride 94, bicarb 27, BUN of 13, serum creatinine 0.52, eGFR greater than 60, nonfasting blood glucose 65, WBC 4.9, hemoglobin 12, platelets of 129.  TSH is 2.844. Lactic acid 0.9 and on repeat is 0.7.  HS troponin was 19 and on repeat is 21. CK is 84. UA is negative for leukocytes and nitrates.  COVID/influenza A/influenza B/RSV PCR were negative.  ED treatment: Dextrose  50%, 1 amp, azithromycin 500 mg IV one-time dose, ceftriaxone 1 g IV one-time dose, LR 1 L bolus. ----------------------------------------------- At bedside, patient was able to tell me his first last name he was not able to tell me his age, the current month, the current calendar year, or his current location..  Daughter, Bob Ortiz is at bedside.  She is not the daughter that he lives with.  She states that her sister called her at around 5 AM this morning stating that their father fell.  Social history: He lives at home with his youngest daughter.  He is retired.  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  Assessment/Plan  Principal Problem:   Traumatic subarachnoid hemorrhage without loss of consciousness, initial encounter (HCC) Active Problems:   Elevated troponin   Hypoglycemia   CAP (community acquired pneumonia)   NICM (nonischemic cardiomyopathy) (HCC)   HFrEF (heart failure with reduced ejection fraction) (HCC)   Adrenal insufficiency (Addison's disease) (HCC)   GERD without esophagitis   Hyponatremia   Atrial fibrillation, chronic (HCC)   Essential hypertension   Head trauma   Subdural hemorrhage (HCC)   Hypothermia   Assessment and Plan:  * Traumatic subarachnoid hemorrhage without loss of consciousness, initial encounter (HCC) Versus subarachnoid hemorrhage suspect secondary to mechanical fall Home Eliquis  will not be resumed on admission No DVT from neurologic prophylaxis Hydralazine  5 mg IV every 6 hours as needed for SBP > 155, 5 days ordered Neurosurgery has been consulted, appreciate recommendation and guidance Repeat CT head without contrast ordered by EDP for day of admission at 1400 hrs. Fall precaution  CAP (community acquired pneumonia) Atypical pneumonia versus viral infection Azithromycin 500 mg IV daily, ceftriaxone 2 g IV daily to complete a 5-day course Incentive spirometry, flutter valve  Hypoglycemia Status post D50 1 amp per EDP Recheck CBG, CBG monitoring every 2 hours, 4 occurrences ordered  Elevated troponin Suspect demand ischemia in setting of pulmonary edema with possible atypical pneumonia Strict I's and O's  Adrenal insufficiency (Addison's disease) (HCC) Home prednisone  5 mg daily with breakfast resume  Hyponatremia At baseline  Hypothermia Check temperature, every hour, 2 occurrence TSH was within normal limits Continue external warming Admit to stepdown  Essential hypertension Hydralazine  5 mg IV every  6 hours as needed for SBP greater 155, 5 days ordered  Chart reviewed.   DVT prophylaxis: Pharmacologic  DVT not initiated on admission.  AM team to initiate pharmacologic DVT when the benefits outweigh the risk. Code Status: DNR/DNI, MOST form in ACP reviewed Diet: Heart healthy diet Family Communication: Updated daughter, Bob Ortiz at bedside Disposition Plan: Pending clinical course Consults called: Neurosurgery Admission status: Stepdown, inpatient  Past Medical History:  Diagnosis Date   Atrial fibrillation (HCC)    Dementia (HCC)    HFrEF (heart failure with reduced ejection fraction) (HCC)    Past Surgical History:  Procedure Laterality Date   FRACTURE SURGERY Right    shoulder   Social History:  reports that he quit smoking about 50 years ago. His smoking use included cigarettes. He has never used smokeless tobacco. He reports that he does not currently use alcohol. He reports that he does not use drugs.  Allergies  Allergen Reactions   Quinapril Swelling   Bumetanide  Other (See Comments)   Spironolactone Other (See Comments)   Torsemide  Other (See Comments)   Family History  Problem Relation Age of Onset   Stomach cancer Mother    Heart attack Father 27   Stroke Father    Family history: Family history reviewed and not pertinent  Prior to Admission medications   Medication Sig Start Date End Date Taking? Authorizing Provider  carvedilol  (COREG ) 3.125 MG tablet Take 3.125 mg by mouth in the morning and at bedtime. Patient not taking: Reported on 12/13/2023    [provider]  Cholecalciferol  (VITAMIN D -1000 MAX ST) 25 MCG (1000 UT) tablet Take 1,000 Units by mouth daily.    [provider]  cyanocobalamin  (VITAMIN B12) 1000 MCG tablet Take 1,000 mcg by mouth daily. 08/24/23 08/23/24  [provider]  ELIQUIS  5 MG TABS tablet Take 5 mg by mouth 2 (two) times daily. 12/02/22   [provider]  magnesium  oxide (MAG-OX) 400 (240 Mg) MG tablet Take 2 tablets by mouth daily. 11/01/23   [provider]  predniSONE  (DELTASONE ) 5 MG tablet  Take 1 tablet (5 mg total) by mouth daily with breakfast. 04/18/24   Jens Durand, MD  QUEtiapine  (SEROQUEL ) 25 MG tablet Take 0.5 tablets (12.5 mg total) by mouth at bedtime. 04/17/24   Jens Durand, MD  torsemide  (DEMADEX ) 20 MG tablet Take 1 tablet (20 mg total) by mouth daily as needed (for swelling). Take 1 tablet (20 mg total) by mouth once daily Take an extra 20 mg in the afternoon as needed 04/17/24 08/10/25  Jens Durand, MD   Physical Exam: Vitals:   05/24/24 0830 05/24/24 0900 05/24/24 0930 05/24/24 1028  BP: 117/78 117/74 112/73 109/71  Pulse: 70 70 70 70  Resp: 12 12 11 15   Temp:    (!) 93.7 F (34.3 C)  TempSrc:    Rectal  SpO2: 98% 97% 96% 96%  Weight:      Height:       Constitutional: appears frail, NAD, calm Eyes: PERRL, lids and conjunctivae normal ENMT: Mucous membranes are moist. Posterior pharynx clear of any exudate or lesions. Age-appropriate dentition. Hearing appropriate Neck: normal, supple, no masses, no thyromegaly Respiratory: clear to auscultation bilaterally, no wheezing, no crackles. Normal respiratory effort. No accessory muscle use.  Cardiovascular: Regular rate and rhythm, no murmurs / rubs / gallops. No extremity edema. 2+ pedal pulses. No carotid bruits.  Abdomen: no tenderness, no masses palpated, no hepatosplenomegaly. Bowel sounds positive.  Musculoskeletal: no clubbing /  cyanosis. No joint deformity upper and lower extremities. Good ROM, no contractures, no atrophy. Normal muscle tone. Nasal bridge fracture Skin: nose bridge skin tear, consistent with fall.  Surrounding eye ecchymosis is present on admission (L > R) Neurologic: Sensation intact. Strength 5/5 in all 4.  Psychiatric: Lacks judgment and insight. Alert and oriented x self only.  Depressed mood.   EKG: independently reviewed, showing sinus rhythm with rate of 70, QTc 574  Chest x-ray on Admission: I personally reviewed and I agree with radiologist reading as below.  CT Cervical  Spine Wo Contrast Result Date: 05/24/2024 EXAM: CT CERVICAL SPINE WITHOUT CONTRAST 05/24/2024 07:57:12 AM TECHNIQUE: CT of the cervical spine was performed without the administration of intravenous contrast. Multiplanar reformatted images are provided for review. Automated exposure control, iterative reconstruction, and/or weight based adjustment of the mA/kV was utilized to reduce the radiation dose to as low as reasonably achievable. COMPARISON: Cervical spine CT 05/22/2024. CT head and face 05/24/2024 reported separately. CLINICAL HISTORY: 83 year old male with neck trauma after a fall, on Eliquis , recent ED visit 05/22/2024 for fall and weakness. FINDINGS: CERVICAL SPINE: BONES AND ALIGNMENT: No acute fracture or traumatic malalignment. 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 Developing cervical interbody ankylosis (appears solid at C6-C7). Chronic anterior C1-C2 degeneration including partially calcified ligamentous hypertrophy. SOFT TISSUES: No prevertebral soft tissue swelling. Introducing genius band partially visible left chest cardiac pacemaker type device. LUNGS: Asymmetric upper lung pulmonary ground glass opacity greater on the left is new. IMPRESSION: 1. No acute traumatic injury identified in the cervical spine. 2. Asymmetric upper lung ground-glass opacity is new, consider mild or developing pulmonary edema, viral or atypical respiratory infection. 3. Stable diffuse hyperostosis and cervical spine degeneration. Electronically signed by: Helayne Hurst MD 05/24/2024 08:15 AM EDT RP Workstation: [id number]   CT Maxillofacial Wo Contrast Result Date: 05/24/2024 EXAM: CT OF THE FACE WITHOUT CONTRAST 05/24/2024 07:57:12 AM TECHNIQUE: CT of the face was performed without the administration of intravenous contrast. Multiplanar reformatted images are provided for review. Automated exposure control, iterative reconstruction, and/or  weight based adjustment of the mA/kV was utilized to reduce the radiation dose to as low as reasonably achievable. COMPARISON: CT reported separately today. CLINICAL HISTORY: 83 year old male with blunt facial trauma, laceration to nasal bridge, and history of falls. Patient on Eliquis . FINDINGS: LIMITATIONS/ARTIFACTS: Mild motion artifact. FACIAL BONES: Minimally displaced left nasal bone fracture. Carious left mandible anterior molar, right mandible medial incisor. No mandibular dislocation. No suspicious bone lesion. ORBITS: Globes are intact. No acute traumatic injury. No inflammatory change. SINUSES AND MASTOIDS: Paranasal sinuses, middle ears and mastoids remain clear. SOFT TISSUES: Overlying bilateral nasal bridge soft tissue swelling and trace posttraumatic soft tissue gas on the left. Calcified cervical carotid atherosclerosis. Postinflammatory tonsillar pillar calcifications. IMPRESSION: 1. Minimally displaced left nasal bone fractures with overlying soft tissue injury . 2. No other acute traumatic injury identified in the Face. 3. Mandible dental caries. Electronically signed by: Helayne Hurst MD 05/24/2024 08:10 AM EDT RP Workstation: [id number]   CT HEAD WO CONTRAST ( ) Result Date: 05/24/2024 EXAM: CT HEAD WITHOUT CONTRAST 05/24/2024 07:57:12 AM TECHNIQUE: CT of the head was performed without the administration of intravenous contrast. Automated exposure control, iterative reconstruction, and/or weight based adjustment of the mA/kV was utilized to reduce the radiation dose to as low as reasonably achievable. COMPARISON: Face CT reported separately today. CT 05/22/2024. CLINICAL HISTORY: 83 year old male with head trauma after a fall, found on  floor. Laceration to bridge of nose. On Eliquis . Recent fall 05/22/24. FINDINGS: BRAIN AND VENTRICLES: Trace new parafalcine subdural or subarachnoid blood best seen on coronal images series 4 image 33 compared to coronal image 31 on the recent exam. No other  acute intracranial hemorrhage identified. No evidence of acute infarct. No hydrocephalus. No extra-axial collection. No mass effect or midline shift. 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 ORBITS: No acute abnormality. SINUSES: Paranasal sinuses and mastoids remain well aerated. SOFT TISSUES AND SKULL: New nasal bone fractures and overlying soft tissue swelling and soft tissue gas, reported on Face CT separately. Traumatic Brain Injury Risk Stratification ----- Low - mBIG 1 IMPRESSION: 1. Trace new parafalcine subdural or subarachnoid hemorrhage. No midline shift or complicating features. 2. Nasal injury, See Face CT. Electronically signed by: Helayne Hurst MD 05/24/2024 08:07 AM EDT RP Workstation: [id number]   DG Chest Portable 1 View Result Date: 05/24/2024 EXAM: 1 VIEW(S) XRAY OF THE CHEST 05/24/2024 07:03:00 AM COMPARISON: 04/06/2024 CLINICAL HISTORY: Hypothermia, eval for sepsis. Hypothermia, eval for sepsis. FINDINGS: LINES, TUBES AND DEVICES: Left chest wall pacer device is identified with 2 leads overlying the right ventricle. Unchanged in appearance from the previous exam. LUNGS AND PLEURA: Low lung volumes. Left basilar opacity. Small left pleural effusion. No pulmonary edema. No pneumothorax. HEART AND MEDIASTINUM: Cardiomegaly. Aortic atherosclerosis. BONES AND SOFT TISSUES: Bilateral shoulder degenerative change. IMPRESSION: 1. Left basilar opacity which is favored to represent a combination of pleural effusion and atelectasis. Underlying airspace disease not excluded. 2. 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 Automated exposure control, iterative reconstruction,  and/or weight based adjustment of the mA/kV was utilized to reduce the radiation dose to as low as reasonably achievable. COMPARISON: 04/06/2024 CLINICAL HISTORY: Head trauma, minor (Age >= 65y). Patient to ED via [facility] from home for a fall at 2am. Pt has skin tares to left elbow and hand. Hx of dementia- family states worse than normal and hallucinations. FINDINGS: BRAIN AND VENTRICLES: No acute hemorrhage. No evidence of acute infarct. No hydrocephalus. No extra-axial collection. No mass effect or midline shift. Partially empty sella. Calcific atherosclerosis. ORBITS: No acute abnormality. SINUSES: No acute abnormality. SOFT TISSUES AND SKULL: No acute soft tissue abnormality. No skull fracture. IMPRESSION: 1. No acute intracranial abnormality related to head trauma. Electronically signed by: Norman Gatlin MD 05/22/2024 06:40 PM EDT RP Workstation: [id number]   CT CERVICAL SPINE WO CONTRAST Result Date: 05/22/2024 EXAM: CT CERVICAL SPINE WITHOUT CONTRAST 05/22/2024 06:27:00 PM TECHNIQUE: CT of the cervical spine was performed without the administration of intravenous contrast. Multiplanar reformatted images are provided for review. Automated exposure control, iterative reconstruction, and/or weight based adjustment of the mA/kV was utilized to reduce the radiation dose to as low as reasonably achievable. COMPARISON: 04/06/2024 CLINICAL HISTORY: Head trauma, minor (Age >= 65y). Patient to ED via [facility] from home for a fall at 2am. Pt has skin tares to left elbow and hand. Hx of dementia- family states worse than normal and hallucinations. FINDINGS: CERVICAL SPINE: BONES AND ALIGNMENT: No acute fracture or traumatic malalignment. DEGENERATIVE CHANGES: Advanced multilevel spondylosis with bulky bridging anterior osteophytes. No severe spinal canal narrowing. SOFT TISSUES: No prevertebral soft tissue swelling. IMPRESSION: 1. No acute abnormality of  the cervical spine. Electronically signed by: Norman Gatlin MD  05/22/2024 06:38 PM EDT RP Workstation: [id number]   Labs on Admission: I have personally reviewed following labs  CBC: Recent Labs  Lab 05/22/24 1747 05/24/24 0634  WBC 6.6 4.9  NEUTROABS  --  3.1  HGB 12.9* 12.0*  HCT 38.3* 35.8*  MCV 89.3 89.1  PLT 147* 129*   Basic Metabolic Panel: Recent Labs  Lab 05/22/24 1747 05/24/24 0634  NA 130* 132*  K 4.6 4.2  CL 91* 94*  CO2 30 27  GLUCOSE 80 65*  BUN 11 13  CREATININE 0.47* 0.52*  CALCIUM 8.6* 8.5*   GFR: Estimated Creatinine Clearance: 71.2 mL/min (A) (by C-G formula based on SCr of 0.52 mg/dL (L)).  Liver Function Tests: Recent Labs  Lab 05/24/24 0634  AST 43*  ALT 43  ALKPHOS 110  BILITOT 0.9  PROT 6.5  ALBUMIN 3.2*   Coagulation Profile: Recent Labs  Lab 05/22/24 1747  INR 1.1   Cardiac Enzymes: Recent Labs  Lab 05/24/24 0819  CKTOTAL 84   Thyroid Function Tests: Recent Labs    05/24/24 0634  TSH 2.844   Urine analysis:    Component Value Date/Time   COLORURINE YELLOW (A) 05/24/2024 0848   APPEARANCEUR CLEAR (A) 05/24/2024 0848   LABSPEC 1.011 05/24/2024 0848   PHURINE 6.0 05/24/2024 0848   GLUCOSEU NEGATIVE 05/24/2024 0848   HGBUR SMALL (A) 05/24/2024 0848   BILIRUBINUR NEGATIVE 05/24/2024 0848   KETONESUR NEGATIVE 05/24/2024 0848   PROTEINUR NEGATIVE 05/24/2024 0848   NITRITE NEGATIVE 05/24/2024 0848   LEUKOCYTESUR NEGATIVE 05/24/2024 0848   This document was prepared using Dragon Voice Recognition software and may include unintentional dictation errors.  Dr. Sherre [facility]  If 7PM-7AM, please contact overnight-coverage provider If 7AM-7PM, please contact day attending provider [email or web address]  05/24/2024, 10:55 AM

## 2024-05-24 NOTE — Assessment & Plan Note (Deleted)
 Versus subarachnoid hemorrhage suspect secondary to mechanical fall Home Eliquis  will not be resumed on admission No DVT from neurologic prophylaxis Hydralazine  5 mg IV every 6 hours as needed for SBP > 155, 5 days ordered

## 2024-05-24 NOTE — ED Triage Notes (Signed)
 Pt arrived via [facility] from home where family heard pt fall and found him on floor in door way. Pt has laceration to bridge of nose Pt on Eliquis . Recent ED visit on 05/22/24 for fall and weakness.   Rectal temp on arrival 96.46F. Bear hugger applied.

## 2024-05-24 NOTE — Progress Notes (Addendum)
 [facility] Liaison Note  Patient is a current palliative, pending [facility] patient.  [facility] will follow through discharge disposition.   Please call with any hospice related questions or concerns.  [facility] Liasion [phone]

## 2024-05-24 NOTE — Hospital Course (Addendum)
 Hospital course / significant events:   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  10/22: admitted to hospitalist - neurosurgery consult, no surgery needed and recs consult neuro if seizure activity/ Follow CT. Tx pneumonia.   10/23: transition to comfort measures only status and beginning process of finding appropriate dispo / hospice care  10/29: family would now like to move forward with home hospice AFTER rehab     Consultants:  Neurosurgery   Procedures/Surgeries: none      ASSESSMENT & PLAN:   # Goals of care Per notes After discussion of baseline (end-stage chf, advanced dementia, frequent falls), family elected on 10/23 to pursue full comfort care. Now pursuing safe disposition. Not currently candidate for inpatient hospice. Patient has stabilized and worked with PT TOC and palliative care are following, hopeful for SNF of note patient's wife is apparently currently hospitalized at [facility], intubated   # ICH After fall, stable on repeat head ct  Treat seizure if these develop but otherwise monitor    # Dementia, advanced No behavioral disturbance Monitor / treat as needed   # Hyponatremia Chronic likely 2/2 adrenal insufficiency, head trauma likely contributing now comfort care, no labs    # Nasal bone fracture After fall, not complaining of pain   # Adrenal insufficiency On prednisone  at home will continue here for symptom mgt  # Hypothyroid Tsh wnl home synthroid ok to continue to avoid myxedema symptoms    # A-fib Rate controlled currently. Has pacer. home meds held d/t low BP /comfort measures    # HFrEF Ef less than 20, mild rv dysfunction, grade 3 dd, intolerant of all gdmt. Does not appear volume up Monitor / diruese prn    #  Constipation Bowel regimen / prn     No concerns based on BMI: Body mass index is 24.38 kg/m.SABRA Significantly low or high BMI is associated with higher medical risk.  Underweight - under 18  overweight - 25 to 29 obese - 30 or more Class 1 obesity: BMI of 30.0 to 34 Class 2 obesity: BMI of 35.0 to 39 Class 3 obesity: BMI of 40.0 to 49 Super Morbid Obesity: BMI 50-59 Super-super Morbid Obesity: BMI 60+ Healthy nutrition and physical activity advised as adjunct to other disease management and risk reduction treatments    DVT prophylaxis: n/a on comfort measures IV fluids: n/a on comfort measures Nutrition: tube feeds / as desired for comfort Central lines / other devices: G tube  Code Status: DNR ACP documentation reviewed: has DNR and advanced directive on file in [facility]  [facility] needs: placement Medical barriers to dispo: none. DISCHARGE ORDER PLACED 05/31/24 PER 'CARE WITHOUT DELAY' PROTOCOL BASED ON MEDICAL STABILITY. PLEASE NOTE THAT FINAL DISPOSITION PLAN IS IN PROCESS. DO NOT DISCHARGE FROM [facility] FACILITY WITHOUT DISCUSSION W/ ATTENDING HOSPITALIST FIRST. THANKS.

## 2024-05-24 NOTE — Assessment & Plan Note (Addendum)
 Hydralazine  5 mg IV every 6 hours as needed for SBP greater 155, 5 days ordered

## 2024-05-24 NOTE — Assessment & Plan Note (Addendum)
 Check temperature, every hour, 2 occurrence TSH was within normal limits Continue external warming Admit to stepdown

## 2024-05-24 NOTE — ED Provider Notes (Signed)
 Emergency Medicine Provider Triage Evaluation Note  [name] , a 83 y.o. male  was evaluated in triage.  Pt complains of fall, weakness.  Review of Systems  Positive: Fall, weakness   Physical Exam  BP 108/73 (BP Location: Right Arm)   Pulse 70   Temp (!) 93.6 F (34.2 C) (Rectal)   Resp 12   Ht 5' 9 (1.753 m)   Wt 76.4 kg   SpO2 100%   BMI 24.87 kg/m  Gen:   Awake, no distress   Resp:  Normal effort  MSK:   Moves extremities without difficulty  Other:  Facial abrasions  Medical Decision Making  Medically screening exam initiated at 6:38 AM.  Appropriate orders placed.  [name] was informed that the remainder of the evaluation will be completed by another provider, this initial triage assessment does not replace that evaluation, and the importance of remaining in the ED until their evaluation is complete.  Patient here from home after a fall.  Has obvious signs of head injury.  On Eliquis .  Also found to be hypothermic.  Labs, cultures, urine, chest x-ray, CT of the head, face and cervical spine ordered.  Will place under [employer]  Otherwise normal vital signs.   Arden Axon, Josette SAILOR, [place] 05/24/24 [phone]

## 2024-05-25 ENCOUNTER — Inpatient Hospital Stay

## 2024-05-25 DIAGNOSIS — S066X0A Traumatic subarachnoid hemorrhage without loss of consciousness, initial encounter: Secondary | ICD-10-CM | POA: Diagnosis not present

## 2024-05-25 LAB — CBC
HCT: 36.7 % — ABNORMAL LOW (ref 39.0–52.0)
Hemoglobin: 12.6 g/dL — ABNORMAL LOW (ref 13.0–17.0)
MCH: 30.2 pg (ref 26.0–34.0)
MCHC: 34.3 g/dL (ref 30.0–36.0)
MCV: 88 fL (ref 80.0–100.0)
Platelets: 156 K/uL (ref 150–400)
RBC: 4.17 MIL/uL — ABNORMAL LOW (ref 4.22–5.81)
RDW: 18.9 % — ABNORMAL HIGH (ref 11.5–15.5)
WBC: 7.3 K/uL (ref 4.0–10.5)
nRBC: 0 % (ref 0.0–0.2)

## 2024-05-25 LAB — BASIC METABOLIC PANEL WITH GFR
Anion gap: 12 (ref 5–15)
BUN: 15 mg/dL (ref 8–23)
CO2: 24 mmol/L (ref 22–32)
Calcium: 8.8 mg/dL — ABNORMAL LOW (ref 8.9–10.3)
Chloride: 91 mmol/L — ABNORMAL LOW (ref 98–111)
Creatinine, Ser: 0.49 mg/dL — ABNORMAL LOW (ref 0.61–1.24)
GFR, Estimated: >60 mL/min (ref >60)
Glucose, Bld: 76 mg/dL (ref 70–99)
Potassium: 5 mmol/L (ref 3.5–5.1)
Sodium: 127 mmol/L — ABNORMAL LOW (ref 135–145)

## 2024-05-25 LAB — GLUCOSE, CAPILLARY
Glucose-Capillary: 103 mg/dL — ABNORMAL HIGH (ref 70–99)
Glucose-Capillary: 82 mg/dL (ref 70–99)

## 2024-05-25 MED ORDER — MAGNESIUM SULFATE 2 GM/50ML IV SOLN
2.0000 g | Freq: Once | INTRAVENOUS | Status: AC
  Administered 2024-05-25: 2 g via INTRAVENOUS
  Filled 2024-05-25: qty 50

## 2024-05-25 MED ORDER — SODIUM CHLORIDE 0.9 % IV SOLN: INTRAVENOUS | Status: AC

## 2024-05-25 MED ORDER — GLYCOPYRROLATE 1 MG PO TABS
1.0000 mg | ORAL_TABLET | ORAL | Status: DC | PRN
Start: 2024-05-25 — End: 2024-06-03

## 2024-05-25 MED ORDER — MORPHINE SULFATE (PF) 2 MG/ML IV SOLN
2.0000 mg | INTRAVENOUS | Status: DC | PRN
  Administered 2024-05-29: 2 mg via INTRAVENOUS
  Filled 2024-05-25: qty 1

## 2024-05-25 MED ORDER — ACETAMINOPHEN 650 MG RE SUPP: 650.0000 mg | Freq: Four times a day (QID) | RECTAL | Status: DC | PRN

## 2024-05-25 MED ORDER — POLYVINYL ALCOHOL 1.4 % OP SOLN: 1.0000 [drp] | Freq: Four times a day (QID) | OPHTHALMIC | Status: DC | PRN

## 2024-05-25 MED ORDER — HYDROCORTISONE SOD SUC (PF) 100 MG IJ SOLR: 25.0000 mg | Freq: Three times a day (TID) | INTRAMUSCULAR | Status: DC

## 2024-05-25 MED ORDER — GLYCOPYRROLATE 0.2 MG/ML IJ SOLN: 0.2000 mg | INTRAMUSCULAR | Status: DC | PRN

## 2024-05-25 MED ORDER — HYDROCORTISONE SOD SUC (PF) 100 MG IJ SOLR
50.0000 mg | Freq: Once | INTRAMUSCULAR | Status: AC
  Administered 2024-05-25: 50 mg via INTRAVENOUS
  Filled 2024-05-25: qty 2

## 2024-05-25 MED ORDER — PREDNISONE 10 MG PO TABS
5.0000 mg | ORAL_TABLET | Freq: Every day | ORAL | Status: DC
  Administered 2024-05-26 – 2024-06-02 (×8): 5 mg via ORAL
  Filled 2024-05-25 (×8): qty 1

## 2024-05-25 MED ORDER — DIPHENHYDRAMINE HCL 50 MG/ML IJ SOLN: 25.0000 mg | INTRAMUSCULAR | Status: DC | PRN

## 2024-05-25 MED ORDER — HALOPERIDOL LACTATE 5 MG/ML IJ SOLN
2.5000 mg | INTRAMUSCULAR | Status: DC | PRN
  Administered 2024-05-25 – 2024-05-26 (×2): 2.5 mg via INTRAVENOUS
  Administered 2024-05-28: 5 mg via INTRAVENOUS
  Administered 2024-05-31: 4 mg via INTRAVENOUS
  Filled 2024-05-25 (×5): qty 1

## 2024-05-25 MED ORDER — FUROSEMIDE 10 MG/ML IJ SOLN
20.0000 mg | Freq: Every day | INTRAMUSCULAR | Status: DC
  Administered 2024-05-25: 20 mg via INTRAVENOUS
  Filled 2024-05-25: qty 2

## 2024-05-25 MED ORDER — ACETAMINOPHEN 325 MG PO TABS: 650.0000 mg | ORAL_TABLET | Freq: Four times a day (QID) | ORAL | Status: DC | PRN

## 2024-05-25 MED ORDER — MIDAZOLAM HCL (PF) 2 MG/2ML IJ SOLN
2.0000 mg | INTRAMUSCULAR | Status: DC | PRN
  Administered 2024-05-29: 2 mg via INTRAVENOUS
  Administered 2024-05-29: 4 mg via INTRAVENOUS
  Filled 2024-05-25: qty 4
  Filled 2024-05-25: qty 2

## 2024-05-25 NOTE — Evaluation (Signed)
 Clinical/Bedside Swallow Evaluation Patient Details  Name: Bob Ortiz MRN: [id number] Date of Birth: 1940-11-15  Today's Date: 05/25/2024 Time: SLP Start Time (ACUTE ONLY): 1215 SLP Stop Time (ACUTE ONLY): 1245 SLP Time Calculation (min) (ACUTE ONLY): 30 min  Past Medical History:  Past Medical History:  Diagnosis Date   Atrial fibrillation (HCC)    Dementia (HCC)    HFrEF (heart failure with reduced ejection fraction) (HCC)    Past Surgical History:  Past Surgical History:  Procedure Laterality Date   FRACTURE SURGERY Right    shoulder   HPI:  Pr MD Progress note, 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     At bedside, patient was able to tell me his first last name he was not able to tell me his age, the current month, the current calendar year, or his current location. Head CT today:  Virtually resolved trace right falcine hemorrhage.  2. No new intracranial abnormality. CXR: Left basilar opacity which is favored to represent a combination of pleural effusion and atelectasis. Underlying airspace disease not excluded.   Assessment / Plan / Recommendation  Clinical Impression  Pt seen for bedside swallow evaluation in the setting of subarachnoid/subdural hemorrhages complicated by baseline advanced dementia and acute encephalopathy. Hx of SLP services in May of this year, with recommendation for regular/chopped meat and thin liquids. Pt on room air, alert (eyes open), nodding intermittently (not at direct questions), and with limited command following. Pt opening mouth intermittently- potentially to attempt expression, though no voicing appreciated.   Pt seen with trials of ice chips, thin liquids (via straw) and puree solids. Total assist required for completion- when prompted  to hold cup, pt did not attempt to grip bilaterally. No overt or subtle s/sx pharyngeal dysphagia noted. Vitals stable for duration of trials (avg 97 for O2 saturation). Oral manipulation min increased for clearance. Advanced solids not trialed secondary to current mentation and limited attention/endurance for task.   Based on acute (subarachnoid/subdural hemorrhages) on chronic (dementia) cognitive concerns, acute deconditioning, dependency with feeding, and limited endurance for task of eating, pt is at increased risk for aspiration. Recommend thins and puree with supervision/support for feeding. Meds crushed in puree. Aspiration precautions apply (slow rate, small bites, elevated HOB, and alert for PO intake). Suspect that pt will benefit from snack presentation t/o day vs attempting to complete entire meal. RN and MD aware of recommendations. SLP will continue to follow to determine safety with recommendations.   SLP Visit Diagnosis: Dysphagia, unspecified (R13.10) (cognitive factors impeding oral manipulation)    Aspiration Risk  Moderate aspiration risk    Diet Recommendation   Dysphagia 1 (puree);Thin  Medication Administration: Crushed with puree    Other  Recommendations Oral Care Recommendations: Oral care prior to ice chip/H20;Staff/trained caregiver to provide oral care     Assistance Recommended at Discharge    Functional Status Assessment Patient has had a recent decline in their functional status and demonstrates the ability to make significant improvements in function in a reasonable and predictable amount of time.  Frequency and Duration min 2x/week  2 weeks       Prognosis Prognosis for improved oropharyngeal function: Fair Barriers to Reach Goals: Cognitive deficits;Severity of deficits      Swallow Study   General Date of Onset: 05/25/24 HPI: Pr MD Progress note,  [name] is an 83 year old male with history of dementia, adrenal insufficiency, atrial  fibrillation on Eliquis , depression, anxiety, moderate to advanced dementia, presents ED for chief concerns of mechanical fall and altered mental status.     At bedside, patient was able to tell me his first last name he was not able to tell me his age, the current month, the current calendar year, or his current location. Head CT today:  Virtually resolved trace right falcine hemorrhage.  2. No new intracranial abnormality. Type of Study: Bedside Swallow Evaluation Previous Swallow Assessment: Last seen on 12/14/23 by SLP with recommendation for regular/cut meats and thin liquids. Diet Prior to this Study: Regular;Thin liquids (Level 0) Temperature Spikes Noted: No Respiratory Status: Room air History of Recent Intubation: No Behavior/Cognition: Confused;Distractible;Requires cueing Oral Cavity Assessment:  (labial bruising noted) Oral Care Completed by SLP: Yes Oral Cavity - Dentition: Adequate natural dentition Vision:  (not assessed) Self-Feeding Abilities: Total assist Patient Positioning: Upright in bed Baseline Vocal Quality: Not observed Volitional Cough: Cognitively unable to elicit Volitional Swallow: Unable to elicit    Oral/Motor/Sensory Function Overall Oral Motor/Sensory Function: Within functional limits   Ice Chips Ice chips: Within functional limits Presentation: Spoon   Thin Liquid Thin Liquid: Within functional limits Presentation: Straw    Nectar Thick Nectar Thick Liquid: Not tested   Honey Thick Honey Thick Liquid: Not tested   Puree Puree: Within functional limits Presentation: Spoon   Solid     Solid: Not tested     [place] Keyundra Fant Clapp, MS, CCC-SLP Speech Language Pathologist Rehab Services; [facility] [place] [phone] (ascom)   [place] J Clapp 05/25/2024,1:10 PM

## 2024-05-25 NOTE — Progress Notes (Addendum)
 PROGRESS NOTE    Bob Ortiz  FMW:[id number] DOB: 1940-09-24 DOA: 05/24/2024 PCP: Bob Amato, MD     Brief Narrative:   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   At bedside, patient was able to tell me his first last name he was not able to tell me his age, the current month, the current calendar year, or his current location..   Daughter, Bob Ortiz is at bedside.  She is not the daughter that he lives with.  She states that her sister called her at around 5 AM this morning stating that their father fell.   Social history: He lives at home with his youngest daughter.  He is retired.   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     Assessment & Plan:   Principal Problem:   Traumatic subarachnoid hemorrhage without loss of consciousness, initial encounter (HCC) Active Problems:   Elevated troponin   Hypoglycemia   CAP (community acquired pneumonia)   NICM (nonischemic cardiomyopathy) (HCC)   HFrEF (heart failure with reduced ejection fraction) (HCC)   Adrenal insufficiency (Addison's disease) (HCC)   GERD without esophagitis   Hyponatremia   Atrial fibrillation, chronic (HCC)   Essential hypertension   Head trauma   Subdural hemorrhage (HCC)   Hypothermia  # ICH After fall, stable on repeat head ct but more encephalopathic today - repeat head ct  # encephalopathy # Dementia 2/2 above process on dementia - npo, slp eval  # Hyponatremia Chronic likely 2/2 adrenal insufficiency, head trauma likely contributing - lasix  for home torsemide  - stress dose steroids - fluid restrict if resumes eating  # Nasal bone fracture After fall - outpt ent f/u if consistent w/ goals of care  # End-of-life care End-stage chf, also dementia, now with  frequent falls, ICH. Poor quality of life and very poor prognosis, reviewed all this with daughter today, she is leaning toward transition to comfort care, wants to review with family - consider palliative consult if additional family questions  # Adrenal insufficiency On prednisone  at home - will order stress dose steroids for next 24 hrs, hydrocort  20 IV would substitute home dose of oral prednisone   # Pulmonary opacity On cxr, no clinical s/s of pneumonia - hold additional abx and monitor  # Hypothyroid Tsh wnl - home synthroid when taking po  # A-fib Rate controlled currently. Has pacer. - holding home apixaban , indefinitely now  # HFrEF Ef less than 20, mild rv dysfunction, grade 3 dd, intolerant of all gdmt. Does not appear volume up - lasix  for home torsemide    DVT prophylaxis: none Code Status: dnr/dni Family Communication: daughter updated telephonically 10/23  Level of care: Stepdown Status is: Inpatient Remains inpatient appropriate because: severity of illness    Consultants:  neurosurgery  Procedures: none  Antimicrobials:  S/p ceftriaxone/azithromycin    Subjective: Not interactive  Objective: Vitals:   05/25/24 0500 05/25/24 0600 05/25/24 0800 05/25/24 0900  BP: (!) 131/90 125/82 118/84 130/89  Pulse: 81 63 70 79  Resp: 18 16    Temp:   98.2 F (36.8 C)   TempSrc:   Axillary   SpO2: 98% 96% 94% 92%  Weight:      Height:        Intake/Output Summary (Last 24 hours) at 05/25/2024 0941 Last data  filed at 05/25/2024 0500 Gross per 24 hour  Intake 50 ml  Output 400 ml  Net -350 ml   Filed Weights   05/24/24 0628 05/24/24 1747  Weight: 76.4 kg 74.9 kg    Examination:  General exam: NAD, chronically ill appearing Respiratory system: Clear to auscultation save for basilar rales. Respiratory effort normal. Cardiovascular system: S1 & S2 heard, RR, distant sounds, soft systolic murmur Gastrointestinal system: Abdomen is nondistended,  soft and nontender. No organomegaly or masses felt.   Central nervous system: awake, not oriented, moving all 4 Extremities: Symmetric 5 x 5 power. No edema Skin: laceration on nose hemostatic, some bruising on extremities Psychiatry: confused, doesn't respond intelligibly.     Data Reviewed: I have personally reviewed following labs and imaging studies  CBC: Recent Labs  Lab 05/22/24 1747 05/24/24 0634 05/25/24 0256  WBC 6.6 4.9 7.3  NEUTROABS  --  3.1  --   HGB 12.9* 12.0* 12.6*  HCT 38.3* 35.8* 36.7*  MCV 89.3 89.1 88.0  PLT 147* 129* 156   Basic Metabolic Panel: Recent Labs  Lab 05/22/24 1747 05/24/24 0634 05/24/24 2311 05/25/24 0256  NA 130* 132* 126* 127*  K 4.6 4.2 5.4* 5.0  CL 91* 94* 91* 91*  CO2 30 27 26 24   GLUCOSE 80 65* 82 76  BUN 11 13 16 15   CREATININE 0.47* 0.52* 0.71 0.49*  CALCIUM 8.6* 8.5* 8.8* 8.8*  MG  --   --  1.8  --    GFR: Estimated Creatinine Clearance: 71.2 mL/min (A) (by C-G formula based on SCr of 0.49 mg/dL (L)). Liver Function Tests: Recent Labs  Lab 05/24/24 0634  AST 43*  ALT 43  ALKPHOS 110  BILITOT 0.9  PROT 6.5  ALBUMIN 3.2*   No results for input(s): LIPASE, AMYLASE in the last 168 hours. No results for input(s): AMMONIA in the last 168 hours. Coagulation Profile: Recent Labs  Lab 05/22/24 1747  INR 1.1   Cardiac Enzymes: Recent Labs  Lab 05/24/24 0819  CKTOTAL 84   BNP (last 3 results) No results for input(s): PROBNP in the last 8760 hours. HbA1C: No results for input(s): HGBA1C in the last 72 hours. CBG: Recent Labs  Lab 05/24/24 0935 05/24/24 1024 05/24/24 1230 05/24/24 1556  GLUCAP 57* 128* 72 83   Lipid Profile: No results for input(s): CHOL, HDL, LDLCALC, TRIG, CHOLHDL, LDLDIRECT in the last 72 hours. Thyroid Function Tests: Recent Labs    05/24/24 0634  TSH 2.844   Anemia Panel: No results for input(s): VITAMINB12, FOLATE, FERRITIN, TIBC, IRON,  RETICCTPCT in the last 72 hours. Urine analysis:    Component Value Date/Time   COLORURINE YELLOW (A) 05/24/2024 0848   APPEARANCEUR CLEAR (A) 05/24/2024 0848   LABSPEC 1.011 05/24/2024 0848   PHURINE 6.0 05/24/2024 0848   GLUCOSEU NEGATIVE 05/24/2024 0848   HGBUR SMALL (A) 05/24/2024 0848   BILIRUBINUR NEGATIVE 05/24/2024 0848   KETONESUR NEGATIVE 05/24/2024 0848   PROTEINUR NEGATIVE 05/24/2024 0848   NITRITE NEGATIVE 05/24/2024 0848   LEUKOCYTESUR NEGATIVE 05/24/2024 0848   Sepsis Labs: @LABRCNTIP (procalcitonin:4,lacticidven:4)  ) Recent Results (from the past 240 hours)  Blood culture (routine x 2)     Status: None (Preliminary result)   Collection Time: 05/24/24  6:49 AM   Specimen: BLOOD LEFT FOREARM  Result Value Ref Range Status   Specimen Description BLOOD LEFT FOREARM  Final   Special Requests   Final    BOTTLES DRAWN AEROBIC AND ANAEROBIC Blood Culture adequate volume  Culture   Final    NO GROWTH 1 DAY Performed at [facility], [address] [place], [place] [address]    Report Status PENDING  Incomplete  Blood culture (routine x 2)     Status: None (Preliminary result)   Collection Time: 05/24/24  6:49 AM   Specimen: Right Antecubital; Blood  Result Value Ref Range Status   Specimen Description RIGHT ANTECUBITAL  Final   Special Requests   Final    BOTTLES DRAWN AEROBIC AND ANAEROBIC Blood Culture adequate volume   Culture   Final    NO GROWTH 1 DAY Performed at [facility], [address] [place], [place] [address]    Report Status PENDING  Incomplete  Resp panel by RT-PCR (RSV, Flu A&B, Covid) Anterior Nasal Swab     Status: None   Collection Time: 05/24/24  8:19 AM   Specimen: Anterior Nasal Swab  Result Value Ref Range Status   SARS Coronavirus 2 by RT PCR NEGATIVE NEGATIVE Final    Comment: (NOTE) SARS-CoV-2 target nucleic acids are NOT DETECTED.  The SARS-CoV-2 RNA is generally detectable in upper  respiratory specimens during the acute phase of infection. The lowest concentration of SARS-CoV-2 viral copies this assay can detect is 138 copies/mL. A negative result does not preclude SARS-Cov-2 infection and should not be used as the sole basis for treatment or other patient management decisions. A negative result may occur with  improper specimen collection/handling, submission of specimen other than nasopharyngeal swab, presence of viral mutation(s) within the areas targeted by this assay, and inadequate number of viral copies(<138 copies/mL). A negative result must be combined with clinical observations, patient history, and epidemiological information. The expected result is Negative.  Fact Sheet for Patients:  BloggerCourse.com  Fact Sheet for Healthcare Providers:  SeriousBroker.it  This test is no t yet approved or cleared by the [place]  FDA and  has been authorized for detection and/or diagnosis of SARS-CoV-2 by FDA under an Emergency Use Authorization (EUA). This EUA will remain  in effect (meaning this test can be used) for the duration of the COVID-19 declaration under Section 564(b)(1) of the Act, 21 U.S.C.section 360bbb-3(b)(1), unless the authorization is terminated  or revoked sooner.       Influenza A by PCR NEGATIVE NEGATIVE Final   Influenza B by PCR NEGATIVE NEGATIVE Final    Comment: (NOTE) The Xpert Xpress SARS-CoV-2/FLU/RSV plus assay is intended as an aid in the diagnosis of influenza from Nasopharyngeal swab specimens and should not be used as a sole basis for treatment. Nasal washings and aspirates are unacceptable for Xpert Xpress SARS-CoV-2/FLU/RSV testing.  Fact Sheet for Patients: BloggerCourse.com  Fact Sheet for Healthcare Providers: SeriousBroker.it  This test is not yet approved or cleared by the [place]  FDA and has been  authorized for detection and/or diagnosis of SARS-CoV-2 by FDA under an Emergency Use Authorization (EUA). This EUA will remain in effect (meaning this test can be used) for the duration of the COVID-19 declaration under Section 564(b)(1) of the Act, 21 U.S.C. section 360bbb-3(b)(1), unless the authorization is terminated or revoked.     Resp Syncytial Virus by PCR NEGATIVE NEGATIVE Final    Comment: (NOTE) Fact Sheet for Patients: BloggerCourse.com  Fact Sheet for Healthcare Providers: SeriousBroker.it  This test is not yet approved or cleared by the [place]  FDA and has been authorized for detection and/or diagnosis of SARS-CoV-2 by FDA under an Emergency Use Authorization (EUA). This EUA will remain in  effect (meaning this test can be used) for the duration of the COVID-19 declaration under Section 564(b)(1) of the Act, 21 U.S.C. section 360bbb-3(b)(1), unless the authorization is terminated or revoked.  Performed at [facility], [address] [place], [place] [address]   MRSA Next Gen by PCR, Nasal     Status: None   Collection Time: 05/24/24  5:49 PM   Specimen: Nasal Mucosa; Nasal Swab  Result Value Ref Range Status   MRSA by PCR Next Gen NOT DETECTED NOT DETECTED Final    Comment: (NOTE) The GeneXpert MRSA Assay (FDA approved for NASAL specimens only), is one component of a comprehensive MRSA colonization surveillance program. It is not intended to diagnose MRSA infection nor to guide or monitor treatment for MRSA infections. Test performance is not FDA approved in patients less than 68 years old. Performed at [facility], [address], [place], [place] [address]          Radiology Studies: CT Head Wo Contrast Result Date: 05/24/2024 CLINICAL DATA:  Follow-up acute subdural hemorrhage. EXAM: CT HEAD WITHOUT CONTRAST TECHNIQUE: Contiguous axial images were obtained from the base of  the skull through the vertex without intravenous contrast. RADIATION DOSE REDUCTION: This exam was performed according to the departmental dose-optimization program which includes automated exposure control, adjustment of the mA and/or kV according to patient size and/or use of iterative reconstruction technique. COMPARISON:  05/24/2024 at 7:44 a.m. FINDINGS: Brain: Ventricles and cisterns are normal. Previous seen trace acute small focus of subdural/subarachnoid hemorrhage over the anterior parafalcine region is slightly less dense and therefore somewhat less apparent. No new areas of hemorrhage. Remainder of the exam is unchanged. Vascular: No hyperdense vessel or unexpected calcification. Skull: Normal. Negative for fracture or focal lesion. Sinuses/Orbits: No acute finding. Other: None. IMPRESSION: Previously seen trace acute small focus of subdural/subarachnoid hemorrhage over the anterior parafalcine region is slightly less dense and therefore somewhat less apparent. No new areas of hemorrhage. Electronically Signed   By: Toribio Agreste M.D.   On: 05/24/2024 15:08   CT Cervical Spine Wo Contrast Result Date: 05/24/2024 EXAM: CT CERVICAL SPINE WITHOUT CONTRAST 05/24/2024 07:57:12 AM TECHNIQUE: CT of the cervical spine was performed without the administration of intravenous contrast. Multiplanar reformatted images are provided for review. Automated exposure control, iterative reconstruction, and/or weight based adjustment of the mA/kV was utilized to reduce the radiation dose to as low as reasonably achievable. COMPARISON: Cervical spine CT 05/22/2024. CT head and face 05/24/2024 reported separately. CLINICAL HISTORY: 83 year old male with neck trauma after a fall, on Eliquis , recent ED visit 05/22/2024 for fall and weakness. FINDINGS: CERVICAL SPINE: BONES AND ALIGNMENT: No acute fracture or traumatic malalignment. DEGENERATIVE CHANGES: Stable diffuse hyperostosis in the cervical spine with bulky anterior  endplate osteophytes. Developing cervical interbody ankylosis (appears solid at C6-C7). Chronic anterior C1-C2 degeneration including partially calcified ligamentous hypertrophy. SOFT TISSUES: No prevertebral soft tissue swelling. Introducing genius band partially visible left chest cardiac pacemaker type device. LUNGS: Asymmetric upper lung pulmonary ground glass opacity greater on the left is new. IMPRESSION: 1. No acute traumatic injury identified in the cervical spine. 2. Asymmetric upper lung ground-glass opacity is new, consider mild or developing pulmonary edema, viral or atypical respiratory infection. 3. Stable diffuse hyperostosis and cervical spine degeneration. Electronically signed by: Helayne Hurst MD 05/24/2024 08:15 AM EDT RP Workstation: [id number]   CT Maxillofacial Wo Contrast Result Date: 05/24/2024 EXAM: CT OF THE FACE WITHOUT CONTRAST 05/24/2024 07:57:12 AM TECHNIQUE: CT of the face  was performed without the administration of intravenous contrast. Multiplanar reformatted images are provided for review. Automated exposure control, iterative reconstruction, and/or weight based adjustment of the mA/kV was utilized to reduce the radiation dose to as low as reasonably achievable. COMPARISON: CT reported separately today. CLINICAL HISTORY: 83 year old male with blunt facial trauma, laceration to nasal bridge, and history of falls. Patient on Eliquis . FINDINGS: LIMITATIONS/ARTIFACTS: Mild motion artifact. FACIAL BONES: Minimally displaced left nasal bone fracture. Carious left mandible anterior molar, right mandible medial incisor. No mandibular dislocation. No suspicious bone lesion. ORBITS: Globes are intact. No acute traumatic injury. No inflammatory change. SINUSES AND MASTOIDS: Paranasal sinuses, middle ears and mastoids remain clear. SOFT TISSUES: Overlying bilateral nasal bridge soft tissue swelling and trace posttraumatic soft tissue gas on the left. Calcified cervical carotid  atherosclerosis. Postinflammatory tonsillar pillar calcifications. IMPRESSION: 1. Minimally displaced left nasal bone fractures with overlying soft tissue injury . 2. No other acute traumatic injury identified in the Face. 3. Mandible dental caries. Electronically signed by: Helayne Hurst MD 05/24/2024 08:10 AM EDT RP Workstation: [id number]   CT HEAD WO CONTRAST ( ) Result Date: 05/24/2024 EXAM: CT HEAD WITHOUT CONTRAST 05/24/2024 07:57:12 AM TECHNIQUE: CT of the head was performed without the administration of intravenous contrast. Automated exposure control, iterative reconstruction, and/or weight based adjustment of the mA/kV was utilized to reduce the radiation dose to as low as reasonably achievable. COMPARISON: Face CT reported separately today. CT 05/22/2024. CLINICAL HISTORY: 83 year old male with head trauma after a fall, found on floor. Laceration to bridge of nose. On Eliquis . Recent fall 05/22/24. FINDINGS: BRAIN AND VENTRICLES: Trace new parafalcine subdural or subarachnoid blood best seen on coronal images series 4 image 33 compared to coronal image 31 on the recent exam. No other acute intracranial hemorrhage identified. No evidence of acute infarct. No hydrocephalus. No extra-axial collection. No mass effect or midline shift. 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 ORBITS: No acute abnormality. SINUSES: Paranasal sinuses and mastoids remain well aerated. SOFT TISSUES AND SKULL: New nasal bone fractures and overlying soft tissue swelling and soft tissue gas, reported on Face CT separately. Traumatic Brain Injury Risk Stratification ----- Low - mBIG 1 IMPRESSION: 1. Trace new parafalcine subdural or subarachnoid hemorrhage. No midline shift or complicating features. 2. Nasal injury, See Face CT. Electronically signed by: Helayne Hurst MD 05/24/2024 08:07 AM EDT RP Workstation: [id number]   DG Chest Portable 1 View Result Date: 05/24/2024 EXAM: 1 VIEW(S) XRAY OF THE CHEST  05/24/2024 07:03:00 AM COMPARISON: 04/06/2024 CLINICAL HISTORY: Hypothermia, eval for sepsis. Hypothermia, eval for sepsis. FINDINGS: LINES, TUBES AND DEVICES: Left chest wall pacer device is identified with 2 leads overlying the right ventricle. Unchanged in appearance from the previous exam. LUNGS AND PLEURA: Low lung volumes. Left basilar opacity. Small left pleural effusion. No pulmonary edema. No pneumothorax. HEART AND MEDIASTINUM: Cardiomegaly. Aortic atherosclerosis. BONES AND SOFT TISSUES: Bilateral shoulder degenerative change. IMPRESSION: 1. Left basilar opacity which is favored to represent a combination of pleural effusion and atelectasis. Underlying airspace disease not excluded. 2. Unchanged appearance of left chest wall dual lead pacer device Electronically signed by: Waddell Calk MD 05/24/2024 07:14 AM EDT RP Workstation: [id number]        Scheduled Meds:  Chlorhexidine Gluconate Cloth  6 each Topical Daily   cyanocobalamin   1,000 mcg Oral Daily   furosemide   20 mg Intravenous Daily   hydrocortisone  sod succinate (SOLU-CORTEF ) inj  25 mg Intravenous Q8H   hydrocortisone  sod succinate (SOLU-CORTEF ) inj  50 mg Intravenous Once   QUEtiapine   12.5 mg Oral QHS   Continuous Infusions:   LOS: 1 day   CRITICAL CARE Performed by: Devaughn KATHEE Ban   Total critical care time: 52 minutes  Critical care time was exclusive of separately billable procedures and treating other patients.  Critical care was necessary to treat or prevent imminent or life-threatening deterioration.  Critical care was time spent personally by me on the following activities: development of treatment plan with patient and/or surrogate as well as nursing, discussions with consultants, evaluation of patient's response to treatment, examination of patient, obtaining history from patient or surrogate, ordering and performing treatments and interventions, ordering and review of laboratory studies, ordering and review  of radiographic studies, pulse oximetry and re-evaluation of patient's condition.   Devaughn KATHEE Ban, MD [facility]   If 7PM-7AM, please contact night-coverage [email or web address] Password [email or web address] 05/25/2024, 9:41 AM

## 2024-05-25 NOTE — Plan of Care (Signed)
  Problem: Clinical Measurements: Goal: Will remain free from infection Outcome: Progressing Goal: Diagnostic test results will improve Outcome: Progressing Goal: Cardiovascular complication will be avoided Outcome: Progressing   Problem: Pain Managment: Goal: General experience of comfort will improve and/or be controlled Outcome: Progressing

## 2024-05-25 NOTE — Progress Notes (Signed)
 Spoke with daughter again this afternoon, she elects to pursue full comfort care, will place orders and reach out to hospice team.

## 2024-05-25 NOTE — Progress Notes (Signed)
 PHARMACY CONSULT NOTE - ELECTROLYTES  Pharmacy Consult for Electrolyte Monitoring and Replacement   Recent Labs: Height: 5' 9 (175.3 cm) Weight: 74.9 kg (165 lb 2 oz) IBW/kg (Calculated) : 70.7 Estimated Creatinine Clearance: 71.2 mL/min (A) (by C-G formula based on SCr of 0.49 mg/dL (L)). Potassium (mmol/L)  Date Value  05/25/2024 5.0   Magnesium  (mg/dL)  Date Value  89/77/7974 1.8   Calcium (mg/dL)  Date Value  89/76/7974 8.8 (L)   Albumin (g/dL)  Date Value  89/77/7974 3.2 (L)   Phosphorus (mg/dL)  Date Value  94/87/7974 3.9   Sodium (mmol/L)  Date Value  05/25/2024 127 (L)    Assessment  Bob Ortiz is a 83 y.o. male presenting with fall and AMS. PMH significant for HTN, Afib on Eliquis , NICM, CHF, Addison's Disease, and dementia. Pharmacy has been consulted to monitor and replace electrolytes.  Diet: Heart healthy  Pertinent medications: n/a  Goal of Therapy: Electrolytes WNL - Patient with hyponatremia at baseline per chart review  Plan:  No electrolyte replacement indicated at this time MagSulfate 2g IV x 1 given overnight by provider Check BMP, Mg, Phos with AM labs  Thank you for allowing pharmacy to be a part of this patient's care.  Bernardino George, PharmD Candidate [phone] [employer] 05/25/2024 7:22 AM

## 2024-05-26 DIAGNOSIS — S066X0A Traumatic subarachnoid hemorrhage without loss of consciousness, initial encounter: Secondary | ICD-10-CM | POA: Diagnosis not present

## 2024-05-26 NOTE — Progress Notes (Signed)
 PROGRESS NOTE    ZAYDON KINSER  FMW:[id number] DOB: 1940/12/29 DOA: 05/24/2024 PCP: Salli Amato, MD     Brief Narrative:   Mr. 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   At bedside, patient was able to tell me his first last name he was not able to tell me his age, the current month, the current calendar year, or his current location..   Daughter, [name] is at bedside.  She is not the daughter that he lives with.  She states that her sister called her at around 5 AM this morning stating that their father fell.   Social history: He lives at home with his youngest daughter.  He is retired.   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     Assessment & Plan:   Principal Problem:   Traumatic subarachnoid hemorrhage without loss of consciousness, initial encounter (HCC) Active Problems:   Elevated troponin   Hypoglycemia   CAP (community acquired pneumonia)   NICM (nonischemic cardiomyopathy) (HCC)   HFrEF (heart failure with reduced ejection fraction) (HCC)   Adrenal insufficiency (Addison's disease) (HCC)   GERD without esophagitis   Hyponatremia   Atrial fibrillation, chronic (HCC)   Essential hypertension   Head trauma   Subdural hemorrhage (HCC)   Hypothermia  # Goals of care After discussion of baseline (end-stage chf, advanced dementia, frequent falls), family elected on 10/23 to pursue full comfort care. Now pursuing safe disposition. Not currently candidate for inpatient hospice. - TOC consulted for assistance  # ICH After fall, stable on repeat head ct   # encephalopathy # Dementia 2/2 above process on dementia  # Hyponatremia Chronic likely 2/2 adrenal insufficiency, head trauma likely contributing - now comfort care  # Nasal bone  fracture After fall - pain control  # Adrenal insufficiency On prednisone  at home - will continue here for comfort  # Pulmonary opacity On cxr, no clinical s/s of pneumonia  # Hypothyroid Tsh wnl - home synthroid discontinued  # A-fib Rate controlled currently. Has pacer. - home meds held  # HFrEF Ef less than 20, mild rv dysfunction, grade 3 dd, intolerant of all gdmt. Does not appear volume up   DVT prophylaxis: none Code Status: dnr/dni Family Communication: no answer when daughter montie telephoned today, mailbox full. No answer when daughter wendy telephoned today, mailbox full  Level of care: Med-Surg Status is: Inpatient Remains inpatient appropriate because: severity of illness    Consultants:  neurosurgery  Procedures: none  Antimicrobials:  S/p ceftriaxone/azithromycin    Subjective: Incoherent answer  Objective: Vitals:   05/25/24 1806 05/25/24 1951 05/26/24 0447 05/26/24 0953  BP: 126/76 132/75 111/73 (!) 94/56  Pulse: 71 70 70 72  Resp: 18 18 18 17   Temp: 97.7 F (36.5 C) (!) 97.4 F (36.3 C) 97.6 F (36.4 C)   TempSrc: Oral     SpO2: 100% 100% 99% 100%  Weight:      Height:        Intake/Output Summary (Last 24 hours) at 05/26/2024 1720 Last data filed at 05/26/2024 1300 Gross per 24 hour  Intake 752.96 ml  Output 400 ml  Net 352.96 ml   Filed Weights   05/24/24 0628 05/24/24 1747  Weight: 76.4 kg 74.9 kg    Examination:  General exam: NAD, chronically ill  appearing Respiratory system: normal wob Cardiovascular system: extremities warm Gastrointestinal system: Abdomen is nondistended, soft   Central nervous system: awake, oriented to self Extremities: Symmetric 5 x 5 power. No edema Skin: laceration on nose hemostatic, some bruising on extremities and face Psychiatry: confused, calm    Data Reviewed: I have personally reviewed following labs and imaging studies  CBC: Recent Labs  Lab 05/22/24 1747  05/24/24 0634 05/25/24 0256  WBC 6.6 4.9 7.3  NEUTROABS  --  3.1  --   HGB 12.9* 12.0* 12.6*  HCT 38.3* 35.8* 36.7*  MCV 89.3 89.1 88.0  PLT 147* 129* 156   Basic Metabolic Panel: Recent Labs  Lab 05/22/24 1747 05/24/24 0634 05/24/24 2311 05/25/24 0256  NA 130* 132* 126* 127*  K 4.6 4.2 5.4* 5.0  CL 91* 94* 91* 91*  CO2 30 27 26 24   GLUCOSE 80 65* 82 76  BUN 11 13 16 15   CREATININE 0.47* 0.52* 0.71 0.49*  CALCIUM 8.6* 8.5* 8.8* 8.8*  MG  --   --  1.8  --    GFR: Estimated Creatinine Clearance: 71.2 mL/min (A) (by C-G formula based on SCr of 0.49 mg/dL (L)). Liver Function Tests: Recent Labs  Lab 05/24/24 0634  AST 43*  ALT 43  ALKPHOS 110  BILITOT 0.9  PROT 6.5  ALBUMIN 3.2*   No results for input(s): LIPASE, AMYLASE in the last 168 hours. No results for input(s): AMMONIA in the last 168 hours. Coagulation Profile: Recent Labs  Lab 05/22/24 1747  INR 1.1   Cardiac Enzymes: Recent Labs  Lab 05/24/24 0819  CKTOTAL 84   BNP (last 3 results) No results for input(s): PROBNP in the last 8760 hours. HbA1C: No results for input(s): HGBA1C in the last 72 hours. CBG: Recent Labs  Lab 05/24/24 1024 05/24/24 1230 05/24/24 1556 05/24/24 1742 05/25/24 1949  GLUCAP 128* 72 83 103* 82   Lipid Profile: No results for input(s): CHOL, HDL, LDLCALC, TRIG, CHOLHDL, LDLDIRECT in the last 72 hours. Thyroid Function Tests: Recent Labs    05/24/24 0634  TSH 2.844   Anemia Panel: No results for input(s): VITAMINB12, FOLATE, FERRITIN, TIBC, IRON, RETICCTPCT in the last 72 hours. Urine analysis:    Component Value Date/Time   COLORURINE YELLOW (A) 05/24/2024 0848   APPEARANCEUR CLEAR (A) 05/24/2024 0848   LABSPEC 1.011 05/24/2024 0848   PHURINE 6.0 05/24/2024 0848   GLUCOSEU NEGATIVE 05/24/2024 0848   HGBUR SMALL (A) 05/24/2024 0848   BILIRUBINUR NEGATIVE 05/24/2024 0848   KETONESUR NEGATIVE 05/24/2024 0848   PROTEINUR  NEGATIVE 05/24/2024 0848   NITRITE NEGATIVE 05/24/2024 0848   LEUKOCYTESUR NEGATIVE 05/24/2024 0848   Sepsis Labs: @LABRCNTIP (procalcitonin:4,lacticidven:4)  ) Recent Results (from the past 240 hours)  Blood culture (routine x 2)     Status: None (Preliminary result)   Collection Time: 05/24/24  6:49 AM   Specimen: BLOOD LEFT FOREARM  Result Value Ref Range Status   Specimen Description BLOOD LEFT FOREARM  Final   Special Requests   Final    BOTTLES DRAWN AEROBIC AND ANAEROBIC Blood Culture adequate volume   Culture   Final    NO GROWTH 2 DAYS Performed at [facility], [address] [place], [place] [address]    Report Status PENDING  Incomplete  Blood culture (routine x 2)     Status: None (Preliminary result)   Collection Time: 05/24/24  6:49 AM   Specimen: Right Antecubital; Blood  Result Value Ref Range Status  Specimen Description RIGHT ANTECUBITAL  Final   Special Requests   Final    BOTTLES DRAWN AEROBIC AND ANAEROBIC Blood Culture adequate volume   Culture   Final    NO GROWTH 2 DAYS Performed at [facility], [address] [place], [place] [address]    Report Status PENDING  Incomplete  Resp panel by RT-PCR (RSV, Flu A&B, Covid) Anterior Nasal Swab     Status: None   Collection Time: 05/24/24  8:19 AM   Specimen: Anterior Nasal Swab  Result Value Ref Range Status   SARS Coronavirus 2 by RT PCR NEGATIVE NEGATIVE Final    Comment: (NOTE) SARS-CoV-2 target nucleic acids are NOT DETECTED.  The SARS-CoV-2 RNA is generally detectable in upper respiratory specimens during the acute phase of infection. The lowest concentration of SARS-CoV-2 viral copies this assay can detect is 138 copies/mL. A negative result does not preclude SARS-Cov-2 infection and should not be used as the sole basis for treatment or other patient management decisions. A negative result may occur with  improper specimen collection/handling, submission of specimen  other than nasopharyngeal swab, presence of viral mutation(s) within the areas targeted by this assay, and inadequate number of viral copies(<138 copies/mL). A negative result must be combined with clinical observations, patient history, and epidemiological information. The expected result is Negative.  Fact Sheet for Patients:  BloggerCourse.com  Fact Sheet for Healthcare Providers:  SeriousBroker.it  This test is no t yet approved or cleared by the [place]  FDA and  has been authorized for detection and/or diagnosis of SARS-CoV-2 by FDA under an Emergency Use Authorization (EUA). This EUA will remain  in effect (meaning this test can be used) for the duration of the COVID-19 declaration under Section 564(b)(1) of the Act, 21 U.S.C.section 360bbb-3(b)(1), unless the authorization is terminated  or revoked sooner.       Influenza A by PCR NEGATIVE NEGATIVE Final   Influenza B by PCR NEGATIVE NEGATIVE Final    Comment: (NOTE) The Xpert Xpress SARS-CoV-2/FLU/RSV plus assay is intended as an aid in the diagnosis of influenza from Nasopharyngeal swab specimens and should not be used as a sole basis for treatment. Nasal washings and aspirates are unacceptable for Xpert Xpress SARS-CoV-2/FLU/RSV testing.  Fact Sheet for Patients: BloggerCourse.com  Fact Sheet for Healthcare Providers: SeriousBroker.it  This test is not yet approved or cleared by the [place]  FDA and has been authorized for detection and/or diagnosis of SARS-CoV-2 by FDA under an Emergency Use Authorization (EUA). This EUA will remain in effect (meaning this test can be used) for the duration of the COVID-19 declaration under Section 564(b)(1) of the Act, 21 U.S.C. section 360bbb-3(b)(1), unless the authorization is terminated or revoked.     Resp Syncytial Virus by PCR NEGATIVE NEGATIVE Final     Comment: (NOTE) Fact Sheet for Patients: BloggerCourse.com  Fact Sheet for Healthcare Providers: SeriousBroker.it  This test is not yet approved or cleared by the [place]  FDA and has been authorized for detection and/or diagnosis of SARS-CoV-2 by FDA under an Emergency Use Authorization (EUA). This EUA will remain in effect (meaning this test can be used) for the duration of the COVID-19 declaration under Section 564(b)(1) of the Act, 21 U.S.C. section 360bbb-3(b)(1), unless the authorization is terminated or revoked.  Performed at [facility], [address], [place], [place] [address]   MRSA Next Gen by PCR, Nasal     Status: None   Collection Time: 05/24/24  5:49 PM  Specimen: Nasal Mucosa; Nasal Swab  Result Value Ref Range Status   MRSA by PCR Next Gen NOT DETECTED NOT DETECTED Final    Comment: (NOTE) The GeneXpert MRSA Assay (FDA approved for NASAL specimens only), is one component of a comprehensive MRSA colonization surveillance program. It is not intended to diagnose MRSA infection nor to guide or monitor treatment for MRSA infections. Test performance is not FDA approved in patients less than 51 years old. Performed at [facility], [address], [place], [place] [address]          Radiology Studies: CT HEAD WO CONTRAST ( ) Result Date: 05/25/2024 EXAM: CT HEAD WITHOUT CONTRAST 05/25/2024 10:26:56 AM TECHNIQUE: CT of the head was performed without the administration of intravenous contrast. Automated exposure control, iterative reconstruction, and/or weight based adjustment of the mA/kV was utilized to reduce the radiation dose to as low as reasonably achievable. COMPARISON: Brain MRI 04/07/2024. Head CT 05/24/2024. CLINICAL HISTORY: ICH, decreased level of consciousness. FINDINGS: BRAIN AND VENTRICLES: Trace hemorrhage along the inferior margin of the right falx is subtle on series 4  image 30. This appears virtually resolved compared to prior studies. No new or progressed intracranial hemorrhage. No mass effect or midline shift. Gray white differentiation remains normal for age. No evidence of acute infarct. No hydrocephalus. No extra-axial collection. ORBITS: No acute abnormality. SINUSES: No acute abnormality. SOFT TISSUES AND SKULL: No acute soft tissue abnormality. No skull fracture. IMPRESSION: 1. Virtually resolved trace right falcine hemorrhage. 2. No new intracranial abnormality. Electronically signed by: Helayne Hurst MD 05/25/2024 10:32 AM EDT RP Workstation: [id number]        Scheduled Meds:  Chlorhexidine Gluconate Cloth  6 each Topical Daily   cyanocobalamin   1,000 mcg Oral Daily   predniSONE   5 mg Oral Q breakfast   QUEtiapine   12.5 mg Oral QHS   Continuous Infusions:   LOS: 2 days      Devaughn KATHEE Ban, MD [facility]   If 7PM-7AM, please contact night-coverage [email or web address] Password [email or web address] 05/26/2024, 5:20 PM

## 2024-05-26 NOTE — Plan of Care (Signed)
  Problem: Fluid Volume: Goal: Ability to maintain a balanced intake and output will improve Outcome: Progressing   Problem: Skin Integrity: Goal: Risk for impaired skin integrity will decrease Outcome: Progressing   Problem: Tissue Perfusion: Goal: Adequacy of tissue perfusion will improve Outcome: Progressing   Problem: Clinical Measurements: Goal: Will remain free from infection Outcome: Progressing Goal: Cardiovascular complication will be avoided Outcome: Progressing

## 2024-05-26 NOTE — Progress Notes (Addendum)
 [facility] Room 130 [facility] Liaison Note.    Received request from Transitions of Care Manger for family interest in [facility]. Spoke with patient's daughter, [name],  to confirm interest and explain services. At this time, patient is not IPU appropriate.  HL team will continue to monitor patient's condition for changes that may warrant possiible IPU consideration.  Hospital team notified.    Please call with any hospice related questions or concerns.  Thank you for the opportunity to participate in this patient's care.    Marinell Nova,  [facility] Liaison [phone]

## 2024-05-26 NOTE — Plan of Care (Signed)
  Problem: Fluid Volume: Goal: Ability to maintain a balanced intake and output will improve Outcome: Progressing   Problem: Nutritional: Goal: Maintenance of adequate nutrition will improve Outcome: Progressing   Problem: Skin Integrity: Goal: Risk for impaired skin integrity will decrease Outcome: Progressing   Problem: Clinical Measurements: Goal: Will remain free from infection Outcome: Progressing   Problem: Pain Managment: Goal: General experience of comfort will improve and/or be controlled Outcome: Progressing

## 2024-05-26 NOTE — TOC Progression Note (Signed)
 Transition of Care [facility]) - Progression Note    Patient Details  Name: Bob Ortiz MRN: [id number] Date of Birth: 11-02-1940  [facility] CM/SW Contact  [name], [place] Phone Number: 05/26/2024, 3:14 PM  Clinical Narrative:      CSW spoke with daughter [name] regarding discharge planning. She was extremely distraught as she advised that her mother was just revived. Her mother is inpatient in the ICU at [facility]. She advised that her father was at a SNF at [facility]. She stated that the stay was brief. [name] is not in a position to discuss discharge.   Patient is not able to participate in PT/OT evaluations. Family can't pay privately for SNF.   CSW will defer to leadership for guidance.                    Expected Discharge Plan and Services                                               Social Drivers of Health (SDOH) Interventions SDOH Screenings   Food Insecurity: No Food Insecurity (04/07/2024)  Housing: Low Risk  (04/07/2024)  Transportation Needs: No Transportation Needs (05/25/2024)  Utilities: Not At Risk (05/25/2024)  Financial Resource Strain: Low Risk  (02/08/2024)   Received from [facility]  Physical Activity: Sufficiently Active (11/01/2023)   Received from [facility]  Social Connections: Moderately Isolated (04/07/2024)  Tobacco Use: Medium Risk (05/24/2024)    Readmission Risk Interventions     No data to display

## 2024-05-27 ENCOUNTER — Other Ambulatory Visit: Payer: Self-pay

## 2024-05-27 DIAGNOSIS — S066X0A Traumatic subarachnoid hemorrhage without loss of consciousness, initial encounter: Secondary | ICD-10-CM | POA: Diagnosis not present

## 2024-05-27 NOTE — Progress Notes (Signed)
 PROGRESS NOTE    [name]  FMW:[id number] DOB: 1940-08-10 DOA: 05/24/2024 PCP: Salli Amato, MD     Brief Narrative:   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   At bedside, patient was able to tell me his first last name he was not able to tell me his age, the current month, the current calendar year, or his current location..   Bob Ortiz is at bedside.  She is not the daughter that he lives with.  She states that her sister called her at around 5 AM this morning stating that their father fell.   Social history: He lives at home with his youngest daughter.  He is retired.   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     Assessment & Plan:   Principal Problem:   Traumatic subarachnoid hemorrhage without loss of consciousness, initial encounter (HCC) Active Problems:   Elevated troponin   Hypoglycemia   CAP (community acquired pneumonia)   NICM (nonischemic cardiomyopathy) (HCC)   HFrEF (heart failure with reduced ejection fraction) (HCC)   Adrenal insufficiency (Addison's disease) (HCC)   GERD without esophagitis   Hyponatremia   Atrial fibrillation, chronic (HCC)   Essential hypertension   Head trauma   Subdural hemorrhage (HCC)   Hypothermia  # Goals of care After discussion of baseline (end-stage chf, advanced dementia, frequent falls), family elected on 10/23 to pursue full comfort care. Now pursuing safe disposition. Not currently candidate for inpatient hospice. - TOC consulted for assistance with snf vs long-term care  # ICH After fall, stable on repeat head ct   # Encephalopathy # Dementia 2/2 above process on dementia  # Hyponatremia Chronic likely 2/2 adrenal insufficiency, head trauma likely contributing - now  comfort care  # Nasal bone fracture After fall - pain control  # Adrenal insufficiency On prednisone  at home - will continue here for comfort  # Pulmonary opacity On cxr, no clinical s/s of pneumonia  # Hypothyroid Tsh wnl - home synthroid discontinued  # A-fib Rate controlled currently. Has pacer. - home meds held  # HFrEF Ef less than 20, mild rv dysfunction, grade 3 dd, intolerant of all gdmt. Does not appear volume up   DVT prophylaxis: none Code Status: dnr/dni Family Communication: daughter montie telephonically 10/25  Level of care: Med-Surg Status is: Inpatient Remains inpatient appropriate because: severity of illness    Consultants:  neurosurgery  Procedures: none  Antimicrobials:  S/p ceftriaxone/azithromycin    Subjective: Incoherent answers  Objective: Vitals:   05/26/24 0447 05/26/24 0953 05/26/24 2030 05/27/24 0817  BP: 111/73 (!) 94/56 126/81 139/81  Pulse: 70 72 (!) 104 70  Resp: 18 17 16 18   Temp: 97.6 F (36.4 C)  98.4 F (36.9 C) 98 F (36.7 C)  TempSrc:      SpO2: 99% 100% 99% 100%  Weight:      Height:        Intake/Output Summary (Last 24 hours) at 05/27/2024 1506 Last data filed at 05/27/2024 1100 Gross per 24 hour  Intake 840 ml  Output 1000 ml  Net -160 ml   Filed Weights   05/24/24 0628 05/24/24 1747  Weight: 76.4 kg 74.9 kg    Examination:  General exam: NAD, chronically ill appearing Respiratory system: normal wob Cardiovascular system: extremities warm  Gastrointestinal system: Abdomen is nondistended, soft   Central nervous system: awake, oriented to self Extremities: Symmetric 5 x 5 power. No edema Skin: laceration on nose hemostatic, some bruising on extremities and face Psychiatry: confused, calm    Data Reviewed: I have personally reviewed following labs and imaging studies  CBC: Recent Labs  Lab 05/22/24 1747 05/24/24 0634 05/25/24 0256  WBC 6.6 4.9 7.3  NEUTROABS  --  3.1  --   HGB  12.9* 12.0* 12.6*  HCT 38.3* 35.8* 36.7*  MCV 89.3 89.1 88.0  PLT 147* 129* 156   Basic Metabolic Panel: Recent Labs  Lab 05/22/24 1747 05/24/24 0634 05/24/24 2311 05/25/24 0256  NA 130* 132* 126* 127*  K 4.6 4.2 5.4* 5.0  CL 91* 94* 91* 91*  CO2 30 27 26 24   GLUCOSE 80 65* 82 76  BUN 11 13 16 15   CREATININE 0.47* 0.52* 0.71 0.49*  CALCIUM 8.6* 8.5* 8.8* 8.8*  MG  --   --  1.8  --    GFR: Estimated Creatinine Clearance: 71.2 mL/min (A) (by C-G formula based on SCr of 0.49 mg/dL (L)). Liver Function Tests: Recent Labs  Lab 05/24/24 0634  AST 43*  ALT 43  ALKPHOS 110  BILITOT 0.9  PROT 6.5  ALBUMIN 3.2*   No results for input(s): LIPASE, AMYLASE in the last 168 hours. No results for input(s): AMMONIA in the last 168 hours. Coagulation Profile: Recent Labs  Lab 05/22/24 1747  INR 1.1   Cardiac Enzymes: Recent Labs  Lab 05/24/24 0819  CKTOTAL 84   BNP (last 3 results) No results for input(s): PROBNP in the last 8760 hours. HbA1C: No results for input(s): HGBA1C in the last 72 hours. CBG: Recent Labs  Lab 05/24/24 1024 05/24/24 1230 05/24/24 1556 05/24/24 1742 05/25/24 1949  GLUCAP 128* 72 83 103* 82   Lipid Profile: No results for input(s): CHOL, HDL, LDLCALC, TRIG, CHOLHDL, LDLDIRECT in the last 72 hours. Thyroid Function Tests: No results for input(s): TSH, T4TOTAL, FREET4, T3FREE, THYROIDAB in the last 72 hours.  Anemia Panel: No results for input(s): VITAMINB12, FOLATE, FERRITIN, TIBC, IRON, RETICCTPCT in the last 72 hours. Urine analysis:    Component Value Date/Time   COLORURINE YELLOW (A) 05/24/2024 0848   APPEARANCEUR CLEAR (A) 05/24/2024 0848   LABSPEC 1.011 05/24/2024 0848   PHURINE 6.0 05/24/2024 0848   GLUCOSEU NEGATIVE 05/24/2024 0848   HGBUR SMALL (A) 05/24/2024 0848   BILIRUBINUR NEGATIVE 05/24/2024 0848   KETONESUR NEGATIVE 05/24/2024 0848   PROTEINUR NEGATIVE 05/24/2024 0848    NITRITE NEGATIVE 05/24/2024 0848   LEUKOCYTESUR NEGATIVE 05/24/2024 0848   Sepsis Labs: @LABRCNTIP (procalcitonin:4,lacticidven:4)  ) Recent Results (from the past 240 hours)  Blood culture (routine x 2)     Status: None (Preliminary result)   Collection Time: 05/24/24  6:49 AM   Specimen: BLOOD LEFT FOREARM  Result Value Ref Range Status   Specimen Description BLOOD LEFT FOREARM  Final   Special Requests   Final    BOTTLES DRAWN AEROBIC AND ANAEROBIC Blood Culture adequate volume   Culture   Final    NO GROWTH 3 DAYS Performed at [facility], [address] [place], [place] [address]    Report Status PENDING  Incomplete  Blood culture (routine x 2)     Status: None (Preliminary result)   Collection Time: 05/24/24  6:49 AM   Specimen: Right Antecubital; Blood  Result Value Ref Range Status   Specimen Description RIGHT ANTECUBITAL  Final  Special Requests   Final    BOTTLES DRAWN AEROBIC AND ANAEROBIC Blood Culture adequate volume   Culture   Final    NO GROWTH 3 DAYS Performed at [facility], [address] [place], [place] [address]    Report Status PENDING  Incomplete  Resp panel by RT-PCR (RSV, Flu A&B, Covid) Anterior Nasal Swab     Status: None   Collection Time: 05/24/24  8:19 AM   Specimen: Anterior Nasal Swab  Result Value Ref Range Status   SARS Coronavirus 2 by RT PCR NEGATIVE NEGATIVE Final    Comment: (NOTE) SARS-CoV-2 target nucleic acids are NOT DETECTED.  The SARS-CoV-2 RNA is generally detectable in upper respiratory specimens during the acute phase of infection. The lowest concentration of SARS-CoV-2 viral copies this assay can detect is 138 copies/mL. A negative result does not preclude SARS-Cov-2 infection and should not be used as the sole basis for treatment or other patient management decisions. A negative result may occur with  improper specimen collection/handling, submission of specimen other than nasopharyngeal swab,  presence of viral mutation(s) within the areas targeted by this assay, and inadequate number of viral copies(<138 copies/mL). A negative result must be combined with clinical observations, patient history, and epidemiological information. The expected result is Negative.  Fact Sheet for Patients:  bloggercourse.com  Fact Sheet for Healthcare Providers:  seriousbroker.it  This test is no t yet approved or cleared by the [place]  FDA and  has been authorized for detection and/or diagnosis of SARS-CoV-2 by FDA under an Emergency Use Authorization (EUA). This EUA will remain  in effect (meaning this test can be used) for the duration of the COVID-19 declaration under Section 564(b)(1) of the Act, 21 U.S.C.section 360bbb-3(b)(1), unless the authorization is terminated  or revoked sooner.       Influenza A by PCR NEGATIVE NEGATIVE Final   Influenza B by PCR NEGATIVE NEGATIVE Final    Comment: (NOTE) The Xpert Xpress SARS-CoV-2/FLU/RSV plus assay is intended as an aid in the diagnosis of influenza from Nasopharyngeal swab specimens and should not be used as a sole basis for treatment. Nasal washings and aspirates are unacceptable for Xpert Xpress SARS-CoV-2/FLU/RSV testing.  Fact Sheet for Patients: bloggercourse.com  Fact Sheet for Healthcare Providers: seriousbroker.it  This test is not yet approved or cleared by the [place]  FDA and has been authorized for detection and/or diagnosis of SARS-CoV-2 by FDA under an Emergency Use Authorization (EUA). This EUA will remain in effect (meaning this test can be used) for the duration of the COVID-19 declaration under Section 564(b)(1) of the Act, 21 U.S.C. section 360bbb-3(b)(1), unless the authorization is terminated or revoked.     Resp Syncytial Virus by PCR NEGATIVE NEGATIVE Final    Comment: (NOTE) Fact Sheet for  Patients: bloggercourse.com  Fact Sheet for Healthcare Providers: seriousbroker.it  This test is not yet approved or cleared by the [place]  FDA and has been authorized for detection and/or diagnosis of SARS-CoV-2 by FDA under an Emergency Use Authorization (EUA). This EUA will remain in effect (meaning this test can be used) for the duration of the COVID-19 declaration under Section 564(b)(1) of the Act, 21 U.S.C. section 360bbb-3(b)(1), unless the authorization is terminated or revoked.  Performed at [facility], [address] [place], [place] [address]   MRSA Next Gen by PCR, Nasal     Status: None   Collection Time: 05/24/24  5:49 PM   Specimen: Nasal Mucosa; Nasal Swab  Result  Value Ref Range Status   MRSA by PCR Next Gen NOT DETECTED NOT DETECTED Final    Comment: (NOTE) The GeneXpert MRSA Assay (FDA approved for NASAL specimens only), is one component of a comprehensive MRSA colonization surveillance program. It is not intended to diagnose MRSA infection nor to guide or monitor treatment for MRSA infections. Test performance is not FDA approved in patients less than 18 years old. Performed at [facility], [address] [place], [place] [address]          Radiology Studies: No results found.       Scheduled Meds:  Chlorhexidine Gluconate Cloth  6 each Topical Daily   cyanocobalamin   1,000 mcg Oral Daily   predniSONE   5 mg Oral Q breakfast   QUEtiapine   12.5 mg Oral QHS   Continuous Infusions:   LOS: 3 days      Devaughn KATHEE Ban, MD [facility]   If 7PM-7AM, please contact night-coverage [email or web address] Password [email or web address] 05/27/2024, 3:06 PM

## 2024-05-27 NOTE — Plan of Care (Signed)
  Problem: Skin Integrity: Goal: Risk for impaired skin integrity will decrease Outcome: Progressing   Problem: Clinical Measurements: Goal: Ability to maintain clinical measurements within normal limits will improve Outcome: Progressing Goal: Will remain free from infection Outcome: Progressing Goal: Diagnostic test results will improve Outcome: Progressing Goal: Respiratory complications will improve Outcome: Progressing Goal: Cardiovascular complication will be avoided Outcome: Progressing   Problem: Pain Managment: Goal: General experience of comfort will improve and/or be controlled Outcome: Progressing   Problem: Skin Integrity: Goal: Risk for impaired skin integrity will decrease Outcome: Progressing

## 2024-05-27 NOTE — Progress Notes (Addendum)
 [facility] Room [address] [facility] Hospice Liaison Note   Hospital Liaison team will follow peripherally through discharge disposition.  Hospice can follow patient at a LTC facility.   Please call with any hospice related questions or concerns.   Thank you for the opportunity to participate in this patient's care.   [facility] Liaison [phone]

## 2024-05-27 NOTE — Plan of Care (Signed)
  Problem: Fluid Volume: Goal: Ability to maintain a balanced intake and output will improve Outcome: Progressing   Problem: Nutritional: Goal: Maintenance of adequate nutrition will improve Outcome: Progressing   Problem: Skin Integrity: Goal: Risk for impaired skin integrity will decrease Outcome: Progressing   Problem: Clinical Measurements: Goal: Will remain free from infection Outcome: Progressing   Problem: Activity: Goal: Risk for activity intolerance will decrease Outcome: Progressing

## 2024-05-28 DIAGNOSIS — S066X0A Traumatic subarachnoid hemorrhage without loss of consciousness, initial encounter: Secondary | ICD-10-CM | POA: Diagnosis not present

## 2024-05-28 MED ORDER — SENNA 8.6 MG PO TABS
1.0000 | ORAL_TABLET | Freq: Every day | ORAL | Status: DC
  Administered 2024-05-28 – 2024-06-02 (×6): 8.6 mg via ORAL
  Filled 2024-05-28 (×6): qty 1

## 2024-05-28 MED ORDER — POLYETHYLENE GLYCOL 3350 17 G PO PACK
34.0000 g | PACK | Freq: Every day | ORAL | Status: DC
  Administered 2024-05-28 – 2024-06-02 (×6): 34 g via ORAL
  Filled 2024-05-28 (×7): qty 2

## 2024-05-28 NOTE — Progress Notes (Signed)
 [facility] [address] [facility] Hospice Liaison Note   Hospital Liaison team will follow peripherally through discharge disposition.  Hospice can follow patient at a LTC facility.   Please call with any hospice related questions or concerns.   Thank you for the opportunity to participate in this patient's care.   [facility] Liaison [phone]

## 2024-05-28 NOTE — Plan of Care (Signed)
  Problem: Nutritional: Goal: Maintenance of adequate nutrition will improve Outcome: Progressing   Problem: Skin Integrity: Goal: Risk for impaired skin integrity will decrease Outcome: Progressing   Problem: Clinical Measurements: Goal: Will remain free from infection Outcome: Progressing   Problem: Elimination: Goal: Will not experience complications related to bowel motility Outcome: Not Progressing Bowel regimen added and administered today.

## 2024-05-28 NOTE — Progress Notes (Signed)
 PROGRESS NOTE    Bob Ortiz  FMW:[id number] DOB: Mar 27, 1941 DOA: 05/24/2024 PCP: Bob Amato, MD     Brief Narrative:   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   At bedside, patient was able to tell me his first last name he was not able to tell me his age, the current month, the current calendar year, or his current location..   Daughter, Bob Ortiz is at bedside.  She is not the daughter that he lives with.  She states that her sister called her at around 5 AM this morning stating that their father fell.   Social history: He lives at home with his youngest daughter.  He is retired.   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     Assessment & Plan:   Principal Problem:   Traumatic subarachnoid hemorrhage without loss of consciousness, initial encounter (HCC) Active Problems:   Elevated troponin   Hypoglycemia   CAP (community acquired pneumonia)   NICM (nonischemic cardiomyopathy) (HCC)   HFrEF (heart failure with reduced ejection fraction) (HCC)   Adrenal insufficiency (Addison's disease) (HCC)   GERD without esophagitis   Hyponatremia   Atrial fibrillation, chronic (HCC)   Essential hypertension   Head trauma   Subdural hemorrhage (HCC)   Hypothermia  # Goals of care After discussion of baseline (end-stage chf, advanced dementia, frequent falls), family elected on 10/23 to pursue full comfort care. Now pursuing safe disposition. Not currently candidate for inpatient hospice. - TOC consulted for assistance with snf vs long-term care  # ICH After fall, stable on repeat head ct   # Dementia, advanced No behavioral disturbance  # Hyponatremia Chronic likely 2/2 adrenal insufficiency, head trauma likely contributing - now comfort  care  # Nasal bone fracture After fall, not complaining of pain  # Adrenal insufficiency On prednisone  at home - will continue here for comfort  # Pulmonary opacity On cxr, no clinical s/s of pneumonia  # Hypothyroid Tsh wnl - home synthroid discontinued  # A-fib Rate controlled currently. Has pacer. - home meds held  # HFrEF Ef less than 20, mild rv dysfunction, grade 3 dd, intolerant of all gdmt. Does not appear volume up  # Constipation No documented bm - start miralax/senna - query nursing   DVT prophylaxis: none Code Status: dnr/dni Family Communication: daughter Bob Ortiz telephonically 10/25  Level of care: Med-Surg Status is: Inpatient Remains inpatient appropriate because: severity of illness    Consultants:  neurosurgery  Procedures: none  Antimicrobials:  S/p ceftriaxone/azithromycin    Subjective: Incoherent answers, sitting up in bed, eating lunch  Objective: Vitals:   05/26/24 2030 05/27/24 0817 05/27/24 1549 05/28/24 0548  BP: 126/81 139/81 130/86 124/78  Pulse: (!) 104 70 70 70  Resp: 16 18 18 19   Temp: 98.4 F (36.9 C) 98 F (36.7 C) 98 F (36.7 C) 97.7 F (36.5 C)  TempSrc:    Axillary  SpO2: 99% 100% 100% 100%  Weight:      Height:        Intake/Output Summary (Last 24 hours) at 05/28/2024 1321 Last data filed at 05/28/2024 0900 Gross per 24 hour  Intake 180 ml  Output 1000 ml  Net -820 ml   Filed Weights   05/24/24 0628 05/24/24 1747  Weight: 76.4 kg 74.9 kg  Examination:  General exam: NAD, chronically ill appearing Respiratory system: normal wob Cardiovascular system: extremities warm Gastrointestinal system: Abdomen is nondistended, soft   Central nervous system: awake, oriented to self Extremities: Symmetric 5 x 5 power. No edema Skin: laceration on nose hemostatic, some bruising on extremities and face Psychiatry: confused, calm    Data Reviewed: I have personally reviewed following labs and imaging  studies  CBC: Recent Labs  Lab 05/22/24 1747 05/24/24 0634 05/25/24 0256  WBC 6.6 4.9 7.3  NEUTROABS  --  3.1  --   HGB 12.9* 12.0* 12.6*  HCT 38.3* 35.8* 36.7*  MCV 89.3 89.1 88.0  PLT 147* 129* 156   Basic Metabolic Panel: Recent Labs  Lab 05/22/24 1747 05/24/24 0634 05/24/24 2311 05/25/24 0256  NA 130* 132* 126* 127*  K 4.6 4.2 5.4* 5.0  CL 91* 94* 91* 91*  CO2 30 27 26 24   GLUCOSE 80 65* 82 76  BUN 11 13 16 15   CREATININE 0.47* 0.52* 0.71 0.49*  CALCIUM 8.6* 8.5* 8.8* 8.8*  MG  --   --  1.8  --    GFR: Estimated Creatinine Clearance: 71.2 mL/min (A) (by C-G formula based on SCr of 0.49 mg/dL (L)). Liver Function Tests: Recent Labs  Lab 05/24/24 0634  AST 43*  ALT 43  ALKPHOS 110  BILITOT 0.9  PROT 6.5  ALBUMIN 3.2*   No results for input(s): LIPASE, AMYLASE in the last 168 hours. No results for input(s): AMMONIA in the last 168 hours. Coagulation Profile: Recent Labs  Lab 05/22/24 1747  INR 1.1   Cardiac Enzymes: Recent Labs  Lab 05/24/24 0819  CKTOTAL 84   BNP (last 3 results) No results for input(s): PROBNP in the last 8760 hours. HbA1C: No results for input(s): HGBA1C in the last 72 hours. CBG: Recent Labs  Lab 05/24/24 1024 05/24/24 1230 05/24/24 1556 05/24/24 1742 05/25/24 1949  GLUCAP 128* 72 83 103* 82   Lipid Profile: No results for input(s): CHOL, HDL, LDLCALC, TRIG, CHOLHDL, LDLDIRECT in the last 72 hours. Thyroid Function Tests: No results for input(s): TSH, T4TOTAL, FREET4, T3FREE, THYROIDAB in the last 72 hours.  Anemia Panel: No results for input(s): VITAMINB12, FOLATE, FERRITIN, TIBC, IRON, RETICCTPCT in the last 72 hours. Urine analysis:    Component Value Date/Time   COLORURINE YELLOW (A) 05/24/2024 0848   APPEARANCEUR CLEAR (A) 05/24/2024 0848   LABSPEC 1.011 05/24/2024 0848   PHURINE 6.0 05/24/2024 0848   GLUCOSEU NEGATIVE 05/24/2024 0848   HGBUR SMALL (A)  05/24/2024 0848   BILIRUBINUR NEGATIVE 05/24/2024 0848   KETONESUR NEGATIVE 05/24/2024 0848   PROTEINUR NEGATIVE 05/24/2024 0848   NITRITE NEGATIVE 05/24/2024 0848   LEUKOCYTESUR NEGATIVE 05/24/2024 0848   Sepsis Labs: @LABRCNTIP (procalcitonin:4,lacticidven:4)  ) Recent Results (from the past 240 hours)  Blood culture (routine x 2)     Status: None (Preliminary result)   Collection Time: 05/24/24  6:49 AM   Specimen: BLOOD LEFT FOREARM  Result Value Ref Range Status   Specimen Description BLOOD LEFT FOREARM  Final   Special Requests   Final    BOTTLES DRAWN AEROBIC AND ANAEROBIC Blood Culture adequate volume   Culture   Final    NO GROWTH 4 DAYS Performed at [facility], [address] [place], [place] [address]    Report Status PENDING  Incomplete  Blood culture (routine x 2)     Status: None (Preliminary result)   Collection Time: 05/24/24  6:49 AM   Specimen: Right  Antecubital; Blood  Result Value Ref Range Status   Specimen Description RIGHT ANTECUBITAL  Final   Special Requests   Final    BOTTLES DRAWN AEROBIC AND ANAEROBIC Blood Culture adequate volume   Culture   Final    NO GROWTH 4 DAYS Performed at [facility], [address] [place], [place] [address]    Report Status PENDING  Incomplete  Resp panel by RT-PCR (RSV, Flu A&B, Covid) Anterior Nasal Swab     Status: None   Collection Time: 05/24/24  8:19 AM   Specimen: Anterior Nasal Swab  Result Value Ref Range Status   SARS Coronavirus 2 by RT PCR NEGATIVE NEGATIVE Final    Comment: (NOTE) SARS-CoV-2 target nucleic acids are NOT DETECTED.  The SARS-CoV-2 RNA is generally detectable in upper respiratory specimens during the acute phase of infection. The lowest concentration of SARS-CoV-2 viral copies this assay can detect is 138 copies/mL. A negative result does not preclude SARS-Cov-2 infection and should not be used as the sole basis for treatment or other patient management  decisions. A negative result may occur with  improper specimen collection/handling, submission of specimen other than nasopharyngeal swab, presence of viral mutation(s) within the areas targeted by this assay, and inadequate number of viral copies(<138 copies/mL). A negative result must be combined with clinical observations, patient history, and epidemiological information. The expected result is Negative.  Fact Sheet for Patients:  bloggercourse.com  Fact Sheet for Healthcare Providers:  seriousbroker.it  This test is no t yet approved or cleared by the [place]  FDA and  has been authorized for detection and/or diagnosis of SARS-CoV-2 by FDA under an Emergency Use Authorization (EUA). This EUA will remain  in effect (meaning this test can be used) for the duration of the COVID-19 declaration under Section 564(b)(1) of the Act, 21 U.S.C.section 360bbb-3(b)(1), unless the authorization is terminated  or revoked sooner.       Influenza A by PCR NEGATIVE NEGATIVE Final   Influenza B by PCR NEGATIVE NEGATIVE Final    Comment: (NOTE) The Xpert Xpress SARS-CoV-2/FLU/RSV plus assay is intended as an aid in the diagnosis of influenza from Nasopharyngeal swab specimens and should not be used as a sole basis for treatment. Nasal washings and aspirates are unacceptable for Xpert Xpress SARS-CoV-2/FLU/RSV testing.  Fact Sheet for Patients: bloggercourse.com  Fact Sheet for Healthcare Providers: seriousbroker.it  This test is not yet approved or cleared by the [place]  FDA and has been authorized for detection and/or diagnosis of SARS-CoV-2 by FDA under an Emergency Use Authorization (EUA). This EUA will remain in effect (meaning this test can be used) for the duration of the COVID-19 declaration under Section 564(b)(1) of the Act, 21 U.S.C. section 360bbb-3(b)(1), unless the  authorization is terminated or revoked.     Resp Syncytial Virus by PCR NEGATIVE NEGATIVE Final    Comment: (NOTE) Fact Sheet for Patients: bloggercourse.com  Fact Sheet for Healthcare Providers: seriousbroker.it  This test is not yet approved or cleared by the [place]  FDA and has been authorized for detection and/or diagnosis of SARS-CoV-2 by FDA under an Emergency Use Authorization (EUA). This EUA will remain in effect (meaning this test can be used) for the duration of the COVID-19 declaration under Section 564(b)(1) of the Act, 21 U.S.C. section 360bbb-3(b)(1), unless the authorization is terminated or revoked.  Performed at [facility], [address], [place], [place] [address]   MRSA Next Gen by PCR, Nasal     Status:  None   Collection Time: 05/24/24  5:49 PM   Specimen: Nasal Mucosa; Nasal Swab  Result Value Ref Range Status   MRSA by PCR Next Gen NOT DETECTED NOT DETECTED Final    Comment: (NOTE) The GeneXpert MRSA Assay (FDA approved for NASAL specimens only), is one component of a comprehensive MRSA colonization surveillance program. It is not intended to diagnose MRSA infection nor to guide or monitor treatment for MRSA infections. Test performance is not FDA approved in patients less than 48 years old. Performed at [facility], [address] [place], [place] [address]          Radiology Studies: No results found.       Scheduled Meds:  cyanocobalamin   1,000 mcg Oral Daily   predniSONE   5 mg Oral Q breakfast   QUEtiapine   12.5 mg Oral QHS   Continuous Infusions:   LOS: 4 days      Devaughn KATHEE Ban, MD [facility]   If 7PM-7AM, please contact night-coverage [email or web address] Password [email or web address] 05/28/2024, 1:21 PM

## 2024-05-29 DIAGNOSIS — S066X0A Traumatic subarachnoid hemorrhage without loss of consciousness, initial encounter: Secondary | ICD-10-CM | POA: Diagnosis not present

## 2024-05-29 LAB — CULTURE, BLOOD (ROUTINE X 2)
Culture: NO GROWTH
Culture: NO GROWTH
Special Requests: ADEQUATE
Special Requests: ADEQUATE

## 2024-05-29 MED ORDER — ONDANSETRON HCL 4 MG PO TABS: 4.0000 mg | ORAL_TABLET | Freq: Four times a day (QID) | ORAL | Status: DC | PRN

## 2024-05-29 MED ORDER — ONDANSETRON HCL 4 MG/2ML IJ SOLN: 4.0000 mg | Freq: Four times a day (QID) | INTRAMUSCULAR | Status: DC | PRN

## 2024-05-29 MED ORDER — LACTULOSE 10 GM/15ML PO SOLN
30.0000 g | Freq: Once | ORAL | Status: AC
  Administered 2024-05-29: 30 g via ORAL
  Filled 2024-05-29: qty 60

## 2024-05-29 NOTE — Evaluation (Signed)
 Physical Therapy Evaluation Patient Details Name: Bob Ortiz MRN: [id number] DOB: 1940/08/22 Today's Date: 05/29/2024  History of Present Illness  Pt is an 83 y/o M presenting to ED after sustaining a fall at home with subsequent AMS. CT imaging found new parafalcine subdural or subarachnoid hemorrhage and minimally displaced L nasal bone fractures. MD assessment includes ICH, encephalopathy, hyponatremia, nasal bone fx, pulmonary opacity. 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   Clinical Impression  Pt A&Ox2, pleasantly confused throughout but agreeable to participate in PT evaluation. Pt is a poor historian, per chart review from previous recent admission pt was ambulatory with no AD, supervision for mobility/ADLs, hx of frequent falls. Pt was received in bed, modA trunk assist to achieve sitting EOB. Pt demonstrated poor sitting balance, intermittent min-modA to maintain upright but able to correct to midline with cues. Delayed processing noted throughout session, pt unable to don socks in sitting due to difficulty with sequencing. Pt performed 2 STS from elevated EOB with CGA-minA with VC for hand placement on RW. Pt performed 2 bouts of forward/backward walking from EOB toward door in room with no LOB, required minA for backward walking due to posterior lean, distance self-limited with pt reporting fatigue. Demonstrated short, shuffled gait pattern with very narrow BOS requiring VC to widen BOS to prevent scissoring. Pt was left semi-supine in bed at end of session, all needs in reach. Pt would benefit from skilled PT intervention to address listed deficits (see PT Problem List) and maximize functional mobility.        If plan is discharge home, recommend the following: A lot of help with walking and/or transfers;A lot of help with bathing/dressing/bathroom;Assistance with cooking/housework;Direct  supervision/assist for medications management;Direct supervision/assist for financial management;Assist for transportation;Help with stairs or ramp for entrance;Supervision due to cognitive status   Can travel by private vehicle   Yes    Equipment Recommendations Other (comment) (TBD)  Recommendations for Other Services       Functional Status Assessment Patient has had a recent decline in their functional status and demonstrates the ability to make significant improvements in function in a reasonable and predictable amount of time.     Precautions / Restrictions Precautions Precautions: Fall;ICD/Pacemaker Recall of Precautions/Restrictions: Impaired Restrictions Weight Bearing Restrictions Per Provider Order: No      Mobility  Bed Mobility Overal bed mobility: Needs Assistance Bed Mobility: Supine to Sit, Sit to Supine     Supine to sit: Mod assist, HOB elevated, Used rails Sit to supine: Contact guard assist   General bed mobility comments: modA trunk assist for supine > sit transfer, required frequent cues to redirect to task    Transfers Overall transfer level: Needs assistance Equipment used: Rolling walker (2 wheels) Transfers: Sit to/from Stand Sit to Stand: Min assist, From elevated surface, Contact guard assist           General transfer comment: 2 STS from elevated EOB, minA for initial STS, CGA for 2nd attempt. VC for hand placement on RW    Ambulation/Gait Ambulation/Gait assistance: Contact guard assist, Min assist Gait Distance (Feet): 16 Feet Assistive device: Rolling walker (2 wheels) Gait Pattern/deviations: Step-through pattern, Decreased stride length, Shuffle, Narrow base of support Gait velocity: decreased     General Gait Details: Pt able to perform 2 bouts of amb forward/backward from EOB in room. No LOB throughout, required minA with backward walking due to posterior lean. Very short, shuffled gait  pattern with narrow BOS requiring verbal  cues to separate feet to prevent scissoring  Stairs            Wheelchair Mobility     Tilt Bed    Modified Rankin (Stroke Patients Only)       Balance Overall balance assessment: Needs assistance Sitting-balance support: Feet supported Sitting balance-Leahy Scale: Poor Sitting balance - Comments: Required intermittent min-modA in sitting to correct for posterolateral LOB. Pt able to correct to midline with cues.   Standing balance support: Bilateral upper extremity supported, Reliant on assistive device for balance Standing balance-Leahy Scale: Fair Standing balance comment: more steady with BUE support                             Pertinent Vitals/Pain Pain Assessment Pain Assessment: No/denies pain    Home Living Family/patient expects to be discharged to:: Private residence Living Arrangements: Spouse/significant other;Children Available Help at Discharge: Family Type of Home: House Home Access: Stairs to enter Entrance Stairs-Rails: Right   Alternate Level Stairs-Number of Steps: 3 level house, patient and spouse live on the top 2 floors (other family live on the lower level) without option for 1st floor set-up. 6-9 steps to access patient's living quarters. Home Layout: Multi-level Home Equipment: Agricultural Consultant (2 wheels);Rollator (4 wheels);Shower seat;Hand held shower head;Grab bars - tub/shower      Prior Function Prior Level of Function : Needs assist;Patient poor historian/Family not available             Mobility Comments: Per previous admission, pt requires supervision for ambulation, hx of frequent falls, denies using AD for amb. States that he typically walks quite a lot ADLs Comments: supervision for ADLs     Extremity/Trunk Assessment   Upper Extremity Assessment Upper Extremity Assessment: Generalized weakness    Lower Extremity Assessment Lower Extremity Assessment: Generalized weakness       Communication    Communication Communication: Impaired Factors Affecting Communication: Hearing impaired;Difficulty expressing self    Cognition Arousal: Alert Behavior During Therapy: Flat affect   PT - Cognitive impairments: History of cognitive impairments, Orientation, Sequencing   Orientation impairments: Person, Place, Time                   PT - Cognition Comments: Pt able to state that his name is Tremane, unable to state last name or DOB. Aware that he had a fall. Increased processing time Following commands: Impaired Following commands impaired: Follows one step commands inconsistently, Follows one step commands with increased time     Cueing Cueing Techniques: Verbal cues, Gestural cues, Tactile cues, Visual cues     General Comments      Exercises Other Exercises Other Exercises: HR 76bpm SpO2 98% on RA after bout of amb   Assessment/Plan    PT Assessment Patient needs continued PT services  PT Problem List Decreased strength;Decreased activity tolerance;Decreased balance;Decreased mobility;Decreased coordination;Decreased cognition;Decreased knowledge of use of DME;Decreased safety awareness       PT Treatment Interventions DME instruction;Gait training;Stair training;Functional mobility training;Therapeutic activities;Therapeutic exercise;Balance training;Neuromuscular re-education;Cognitive remediation;Patient/family education    PT Goals (Current goals can be found in the Care Plan section)  Acute Rehab PT Goals Patient Stated Goal: none stated PT Goal Formulation: With patient Time For Goal Achievement: 06/12/24 Potential to Achieve Goals: Poor    Frequency Min 2X/week     Co-evaluation  AM-PAC PT 6 Clicks Mobility  Outcome Measure Help needed turning from your back to your side while in a flat bed without using bedrails?: A Little Help needed moving from lying on your back to sitting on the side of a flat bed without using bedrails?: A  Little Help needed moving to and from a bed to a chair (including a wheelchair)?: A Little Help needed standing up from a chair using your arms (e.g., wheelchair or bedside chair)?: A Little Help needed to walk in hospital room?: A Lot Help needed climbing 3-5 steps with a railing? : Total 6 Click Score: 15    End of Session Equipment Utilized During Treatment: Gait belt Activity Tolerance: Patient tolerated treatment well;Patient limited by fatigue Patient left: in bed;with call bell/phone within reach;with bed alarm set (all 4 rails up per request on pt's door) Nurse Communication: Mobility status PT Visit Diagnosis: Unsteadiness on feet (R26.81);Other abnormalities of gait and mobility (R26.89);Muscle weakness (generalized) (M62.81);Difficulty in walking, not elsewhere classified (R26.2)    Time: 1120-1140 PT Time Calculation (min) (ACUTE ONLY): 20 min   Charges:   PT Evaluation $PT Eval Moderate Complexity: 1 Mod   PT General Charges $$ ACUTE PT VISIT: 1 Visit         Estus Krakowski, SPT

## 2024-05-29 NOTE — Care Management Important Message (Signed)
 Important Message  Patient Details  Name: Bob Ortiz MRN: [id number] Date of Birth: Aug 22, 1940   Important Message Given:  Yes - Medicare IM     Ajane Novella W, CMA 05/29/2024, 2:43 PM

## 2024-05-29 NOTE — Progress Notes (Signed)
 PROGRESS NOTE    Bob Ortiz  FMW:[id number] DOB: Jul 05, 1941 DOA: 05/24/2024 PCP: Bob Amato, MD     Brief Narrative:   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   At bedside, patient was able to tell me his first last name he was not able to tell me his age, the current month, the current calendar year, or his current location..   Daughter, Bob Ortiz is at bedside.  She is not the daughter that he lives with.  She states that her sister called her at around 5 AM this morning stating that their father fell.   Social history: He lives at home with his youngest daughter.  He is retired.   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     Assessment & Plan:   Principal Problem:   Traumatic subarachnoid hemorrhage without loss of consciousness, initial encounter (HCC) Active Problems:   Elevated troponin   Hypoglycemia   CAP (community acquired pneumonia)   NICM (nonischemic cardiomyopathy) (HCC)   HFrEF (heart failure with reduced ejection fraction) (HCC)   Adrenal insufficiency (Addison's disease) (HCC)   GERD without esophagitis   Hyponatremia   Atrial fibrillation, chronic (HCC)   Essential hypertension   Head trauma   Subdural hemorrhage (HCC)   Hypothermia  # Goals of care After discussion of baseline (end-stage chf, advanced dementia, frequent falls), family elected on 10/23 to pursue full comfort care. Now pursuing safe disposition. Not currently candidate for inpatient hospice. 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 Has pacer. - home meds held  # HFrEF Ef less than 20, mild rv dysfunction, grade 3 dd, intolerant of all gdmt. Does not appear volume up  # Constipation No documented bm - started miralax/senna - add lactulose   DVT prophylaxis: none Code Status: dnr/dni Family Communication: daughter Bob Ortiz telephonically 10/27  Level of care: Med-Surg Status is: Inpatient Remains inpatient appropriate because: severity of illness    Consultants:  neurosurgery  Procedures: none  Antimicrobials:  S/p ceftriaxone/azithromycin    Subjective: No complaints, sitting up in bed visiting with a friend. No BM yet  Objective: Vitals:   05/28/24 0548 05/28/24 1751 05/28/24 1916 05/29/24 0813  BP: 124/78 134/79 (!) 141/85 110/72  Pulse: 70 76 83 70  Resp: 19 16  15   Temp: 97.7 F (36.5 C) 98.3 F (36.8 C) 98.3 F (36.8 C) 97.6 F (36.4 C)  TempSrc: Axillary   Oral  SpO2: 100% 100% 100% 97%  Weight:      Height:        Intake/Output Summary (Last 24 hours) at 05/29/2024 1440 Last data filed at 05/29/2024 0600 Gross per 24 hour  Intake 180 ml  Output 400 ml  Net -220 ml   [employer]  05/24/24 0628 05/24/24 1747  Weight: 76.4 kg 74.9 kg    Examination:  General exam: NAD, chronically ill appearing Respiratory system: normal wob Cardiovascular system: extremities warm Gastrointestinal system: Abdomen is nondistended, soft   Central nervous system: awake, oriented to self Extremities: Symmetric 5 x 5 power. No edema Skin: laceration on nose hemostatic, some bruising on extremities and face Psychiatry: confused, calm    Data Reviewed: I  have personally reviewed following labs and imaging studies  CBC: Recent Labs  Lab 05/22/24 1747 05/24/24 0634 05/25/24 0256  WBC 6.6 4.9 7.3  NEUTROABS  --  3.1  --   HGB 12.9* 12.0* 12.6*  HCT 38.3* 35.8* 36.7*  MCV 89.3 89.1 88.0  PLT 147* 129* 156   Basic Metabolic Panel: Recent Labs  Lab 05/22/24 1747 05/24/24 0634 05/24/24 2311 05/25/24 0256  NA 130* 132* 126* 127*  K 4.6 4.2 5.4* 5.0  CL 91* 94* 91* 91*  CO2 30 27 26 24   GLUCOSE 80 65* 82 76  BUN 11 13 16 15   CREATININE 0.47* 0.52* 0.71 0.49*  CALCIUM 8.6* 8.5* 8.8* 8.8*  MG  --   --  1.8  --    GFR: Estimated Creatinine Clearance: 71.2 mL/min (A) (by C-G formula based on SCr of 0.49 mg/dL (L)). Liver Function Tests: Recent Labs  Lab 05/24/24 0634  AST 43*  ALT 43  ALKPHOS 110  BILITOT 0.9  PROT 6.5  ALBUMIN 3.2*   No results for input(s): LIPASE, AMYLASE in the last 168 hours. No results for input(s): AMMONIA in the last 168 hours. Coagulation Profile: Recent Labs  Lab 05/22/24 1747  INR 1.1   Cardiac Enzymes: Recent Labs  Lab 05/24/24 0819  CKTOTAL 84   BNP (last 3 results) No results for input(s): PROBNP in the last 8760 hours. HbA1C: No results for input(s): HGBA1C in the last 72 hours. CBG: Recent Labs  Lab 05/24/24 1024 05/24/24 1230 05/24/24 1556 05/24/24 1742 05/25/24 1949  GLUCAP 128* 72 83 103* 82   Lipid Profile: No results for input(s): CHOL, HDL, LDLCALC, TRIG, CHOLHDL, LDLDIRECT in the last 72 hours. Thyroid Function Tests: No results for input(s): TSH, T4TOTAL, FREET4, T3FREE, THYROIDAB in the last 72 hours.  Anemia Panel: No results for input(s): VITAMINB12, FOLATE, FERRITIN, TIBC, IRON, RETICCTPCT in the last 72 hours. Urine analysis:    Component Value Date/Time   COLORURINE YELLOW (A) 05/24/2024 0848   APPEARANCEUR CLEAR (A) 05/24/2024 0848   LABSPEC 1.011 05/24/2024 0848   PHURINE 6.0 05/24/2024 0848   GLUCOSEU  NEGATIVE 05/24/2024 0848   HGBUR SMALL (A) 05/24/2024 0848   BILIRUBINUR NEGATIVE 05/24/2024 0848   KETONESUR NEGATIVE 05/24/2024 0848   PROTEINUR NEGATIVE 05/24/2024 0848   NITRITE NEGATIVE 05/24/2024 0848   LEUKOCYTESUR NEGATIVE 05/24/2024 0848   Sepsis Labs: @LABRCNTIP (procalcitonin:4,lacticidven:4)  ) Recent Results (from the past 240 hours)  Blood culture (routine x 2)     Status: None   Collection Time: 05/24/24  6:49 AM   Specimen: BLOOD LEFT FOREARM  Result Value Ref Range Status   Specimen Description BLOOD LEFT FOREARM  Final   Special Requests   Final    BOTTLES DRAWN AEROBIC AND ANAEROBIC Blood Culture adequate volume   Culture   Final    NO GROWTH 5 DAYS Performed at [facility], [address], [place], [place] [address]    Report Status 05/29/2024 FINAL  Final  Blood culture (routine x 2)     Status: None  Collection Time: 05/24/24  6:49 AM   Specimen: Right Antecubital; Blood  Result Value Ref Range Status   Specimen Description RIGHT ANTECUBITAL  Final   Special Requests   Final    BOTTLES DRAWN AEROBIC AND ANAEROBIC Blood Culture adequate volume   Culture   Final    NO GROWTH 5 DAYS Performed at [facility], [address] [place], [place] [address]    Report Status 05/29/2024 FINAL  Final  Resp panel by RT-PCR (RSV, Flu A&B, Covid) Anterior Nasal Swab     Status: None   Collection Time: 05/24/24  8:19 AM   Specimen: Anterior Nasal Swab  Result Value Ref Range Status   SARS Coronavirus 2 by RT PCR NEGATIVE NEGATIVE Final    Comment: (NOTE) SARS-CoV-2 target nucleic acids are NOT DETECTED.  The SARS-CoV-2 RNA is generally detectable in upper respiratory specimens during the acute phase of infection. The lowest concentration of SARS-CoV-2 viral copies this assay can detect is 138 copies/mL. A negative result does not preclude SARS-Cov-2 infection and should not be used as the sole basis for treatment or other patient management  decisions. A negative result may occur with  improper specimen collection/handling, submission of specimen other than nasopharyngeal swab, presence of viral mutation(s) within the areas targeted by this assay, and inadequate number of viral copies(<138 copies/mL). A negative result must be combined with clinical observations, patient history, and epidemiological information. The expected result is Negative.  Fact Sheet for Patients:  bloggercourse.com  Fact Sheet for Healthcare Providers:  seriousbroker.it  This test is no t yet approved or cleared by the [place]  FDA and  has been authorized for detection and/or diagnosis of SARS-CoV-2 by FDA under an Emergency Use Authorization (EUA). This EUA will remain  in effect (meaning this test can be used) for the duration of the COVID-19 declaration under Section 564(b)(1) of the Act, 21 U.S.C.section 360bbb-3(b)(1), unless the authorization is terminated  or revoked sooner.       Influenza A by PCR NEGATIVE NEGATIVE Final   Influenza B by PCR NEGATIVE NEGATIVE Final    Comment: (NOTE) The Xpert Xpress SARS-CoV-2/FLU/RSV plus assay is intended as an aid in the diagnosis of influenza from Nasopharyngeal swab specimens and should not be used as a sole basis for treatment. Nasal washings and aspirates are unacceptable for Xpert Xpress SARS-CoV-2/FLU/RSV testing.  Fact Sheet for Patients: bloggercourse.com  Fact Sheet for Healthcare Providers: seriousbroker.it  This test is not yet approved or cleared by the [place]  FDA and has been authorized for detection and/or diagnosis of SARS-CoV-2 by FDA under an Emergency Use Authorization (EUA). This EUA will remain in effect (meaning this test can be used) for the duration of the COVID-19 declaration under Section 564(b)(1) of the Act, 21 U.S.C. section 360bbb-3(b)(1), unless the  authorization is terminated or revoked.     Resp Syncytial Virus by PCR NEGATIVE NEGATIVE Final    Comment: (NOTE) Fact Sheet for Patients: bloggercourse.com  Fact Sheet for Healthcare Providers: seriousbroker.it  This test is not yet approved or cleared by the [place]  FDA and has been authorized for detection and/or diagnosis of SARS-CoV-2 by FDA under an Emergency Use Authorization (EUA). This EUA will remain in effect (meaning this test can be used) for the duration of the COVID-19 declaration under Section 564(b)(1) of the Act, 21 U.S.C. section 360bbb-3(b)(1), unless the authorization is terminated or revoked.  Performed at [facility], [address], [place], [place] [address]  MRSA Next Gen by PCR, Nasal     Status: None   Collection Time: 05/24/24  5:49 PM   Specimen: Nasal Mucosa; Nasal Swab  Result Value Ref Range Status   MRSA by PCR Next Gen NOT DETECTED NOT DETECTED Final    Comment: (NOTE) The GeneXpert MRSA Assay (FDA approved for NASAL specimens only), is one component of a comprehensive MRSA colonization surveillance program. It is not intended to diagnose MRSA infection nor to guide or monitor treatment for MRSA infections. Test performance is not FDA approved in patients less than 11 years old. Performed at [facility], [address], [place], [place] [address]          Radiology Studies: No results found.       Scheduled Meds:  cyanocobalamin   1,000 mcg Oral Daily   lactulose  30 g Oral Once   polyethylene glycol  34 g Oral Daily   predniSONE   5 mg Oral Q breakfast   QUEtiapine   12.5 mg Oral QHS   senna  1 tablet Oral Daily   Continuous Infusions:   LOS: 5 days      Devaughn KATHEE Ban, MD [facility]   If 7PM-7AM, please contact night-coverage [email or web address] Password [email or web address] 05/29/2024, 2:40 PM

## 2024-05-29 NOTE — Progress Notes (Addendum)
 [facility] Room 130 [facility] Hospice Liaison Note   Hospital Liaison team will follow peripherally through discharge disposition. Patient is  currently followed by [facility]'s outpatient based palliative care program. [facility] can provide palliative care services at [facility] rehab, or hospice services at a LTC facility.   Please call with any hospice  or palliative care related questions or concerns.   Thank you for the opportunity to participate in this patient's care.   [facility] Liaison [phone]

## 2024-05-29 NOTE — NC FL2 (Signed)
 [place]  MEDICAID FL2 LEVEL OF CARE FORM     IDENTIFICATION  Patient Name: Bob Ortiz Birthdate: [birth year] Sex: male Admission Date (Current Location): 05/24/2024  [facility] and [place] Number:  Chiropodist and Address:  [facility], [address], [place], [place] [address]      Provider Number: [id number]  Attending Physician Name and Address:  Kandis Devaughn Sayres, MD  Relative Name and Phone Number:  Wells, Mabe (Daughter)  [phone]    Current Level of Care: SNF Recommended Level of Care: Skilled Nursing Facility Prior Approval Number:    Date Approved/Denied:   PASRR Number:    Discharge Plan: SNF    Current Diagnoses: Patient Active Problem List   Diagnosis Date Noted   Head trauma 05/24/2024   Traumatic subarachnoid hemorrhage without loss of consciousness, initial encounter (HCC) 05/24/2024   Subdural hemorrhage (HCC) 05/24/2024   Hypothermia 05/24/2024   HFrEF (heart failure with reduced ejection fraction) (HCC) 05/24/2024   CAP (community acquired pneumonia) 05/24/2024   Adrenal insufficiency (Addison's disease) (HCC) 05/24/2024   Dementia (HCC) 04/17/2024   Hyponatremia 04/10/2024   GERD without esophagitis 04/07/2024   Hypoglycemia 04/06/2024   Acute on chronic congestive heart failure (HCC) 12/21/2023   Episode of shaking 12/21/2023   Seizure (HCC) 12/13/2023   Bilateral pleural effusion 12/13/2023   Elevated troponin 12/13/2023   Atrial fibrillation, chronic (HCC) 12/13/2023   Essential hypertension 12/13/2023   Transaminitis 12/13/2023   NICM (nonischemic cardiomyopathy) (HCC) 12/13/2023   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    Orientation RESPIRATION BLADDER Height & Weight     Self    External catheter Weight: 74.9 kg Height:  5' 9 (175.3 cm)  BEHAVIORAL SYMPTOMS/MOOD NEUROLOGICAL BOWEL NUTRITION STATUS      Incontinent Diet  (Dysphagia I)  AMBULATORY STATUS COMMUNICATION OF NEEDS Skin   Extensive Assist Verbally                         Personal Care Assistance Level of Assistance  Feeding, Bathing, Dressing Bathing Assistance: Maximum assistance Feeding assistance: Limited assistance Dressing Assistance: Maximum assistance     Functional Limitations Info             SPECIAL CARE FACTORS FREQUENCY  PT (By licensed PT), OT (By licensed OT)     PT Frequency: 5 x week OT Frequency: 5 x week            Contractures      Additional Factors Info  Code Status, Allergies Code Status Info: DNR Allergies Info: Mirtazapine, Quinapril, Bumetanide , Torsemide            Current Medications (05/29/2024):  This is the current hospital active medication list Current Facility-Administered Medications  Medication Dose Route Frequency Provider Last Rate Last Admin   acetaminophen  (TYLENOL ) tablet 650 mg  650 mg Oral Q6H PRN Wouk, Devaughn Sayres, MD       Or   acetaminophen  (TYLENOL ) suppository 650 mg  650 mg Rectal Q6H PRN Wouk, Devaughn Sayres, MD       artificial tears ophthalmic solution 1 drop  1 drop Both Eyes QID PRN Wouk, Devaughn Sayres, MD       cyanocobalamin  (VITAMIN B12) tablet 1,000 mcg  1,000 mcg Oral Daily Cox, Amy N, DO   1,000 mcg at 05/29/24 9072   dextrose  50 % solution 50 mL  1 ampule Intravenous PRN Cox, Amy N, DO  diphenhydrAMINE (BENADRYL) injection 25 mg  25 mg Intravenous Q4H PRN Wouk, Devaughn Sayres, MD       glycopyrrolate (ROBINUL) tablet 1 mg  1 mg Oral Q4H PRN Wouk, Devaughn Sayres, MD       Or   glycopyrrolate (ROBINUL) injection 0.2 mg  0.2 mg Subcutaneous Q4H PRN Wouk, Devaughn Sayres, MD       Or   glycopyrrolate (ROBINUL) injection 0.2 mg  0.2 mg Intravenous Q4H PRN Wouk, Devaughn Sayres, MD       haloperidol  lactate (HALDOL ) injection 2.5-5 mg  2.5-5 mg Intravenous Q4H PRN Kandis Devaughn Sayres, MD   5 mg at 05/28/24 2153   midazolam PF (VERSED) injection 2-4 mg  2-4 mg  Intravenous Q4H PRN Kandis Devaughn Sayres, MD   2 mg at 05/29/24 0304   morphine (PF) 2 MG/ML injection 2-4 mg  2-4 mg Intravenous Q30 min PRN Wouk, Devaughn Sayres, MD       ondansetron  (ZOFRAN ) tablet 4 mg  4 mg Oral Q6H PRN Wouk, Devaughn Sayres, MD       Or   ondansetron  (ZOFRAN ) injection 4 mg  4 mg Intravenous Q6H PRN Wouk, Devaughn Sayres, MD       polyethylene glycol (MIRALAX / GLYCOLAX) packet 34 g  34 g Oral Daily Kandis Devaughn Sayres, MD   34 g at 05/29/24 9071   predniSONE  (DELTASONE ) tablet 5 mg  5 mg Oral Q breakfast Wouk, Noah Bedford, MD   5 mg at 05/29/24 9072   QUEtiapine  (SEROQUEL ) tablet 12.5 mg  12.5 mg Oral QHS Cox, Amy N, DO   12.5 mg at 05/28/24 2153   senna (SENOKOT) tablet 8.6 mg  1 tablet Oral Daily Kandis Devaughn Sayres, MD   8.6 mg at 05/29/24 9072   senna-docusate (Senokot-S) tablet 1 tablet  1 tablet Oral QHS PRN Cox, Amy N, DO         Discharge Medications: Please see discharge summary for a list of discharge medications.  Relevant Imaging Results:  Relevant Lab Results:   Additional Information [id number]  Dalia GORMAN Fuse, RN

## 2024-05-30 DIAGNOSIS — S066X0A Traumatic subarachnoid hemorrhage without loss of consciousness, initial encounter: Secondary | ICD-10-CM | POA: Diagnosis not present

## 2024-05-30 MED ORDER — MAGNESIUM HYDROXIDE 400 MG/5ML PO SUSP
30.0000 mL | Freq: Once | ORAL | Status: AC
  Administered 2024-05-30: 30 mL via ORAL
  Filled 2024-05-30: qty 30

## 2024-05-30 NOTE — Progress Notes (Signed)
 PROGRESS NOTE    Bob Ortiz  FMW:[id number] DOB: [birth year] DOA: 05/24/2024 PCP: Salli Amato, MD     Brief Narrative:   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   At bedside, patient was able to tell me his first last name he was not able to tell me his age, the current month, the current calendar year, or his current location..   Daughter, [name] is at bedside.  She is not the daughter that he lives with.  She states that her sister called her at around 5 AM this morning stating that their father fell.   Social history: He lives at home with his youngest daughter.  He is retired.   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     Assessment & Plan:   Principal Problem:   Traumatic subarachnoid hemorrhage without loss of consciousness, initial encounter (HCC) Active Problems:   Elevated troponin   Hypoglycemia   CAP (community acquired pneumonia)   NICM (nonischemic cardiomyopathy) (HCC)   HFrEF (heart failure with reduced ejection fraction) (HCC)   Adrenal insufficiency (Addison's disease) (HCC)   GERD without esophagitis   Hyponatremia   Atrial fibrillation, chronic (HCC)   Essential hypertension   Head trauma   Subdural hemorrhage (HCC)   Hypothermia  # Goals of care After discussion of baseline (end-stage chf, advanced dementia, frequent falls), family elected on 10/23 to pursue full comfort care. Now pursuing safe disposition. Not currently candidate for inpatient hospice. Patient has stabilized and worked with PT  - TOC consulted, hopeful for SNF - daughter says she can't handle him at home but advised if unable to be placed will need to make accommodations for patient to return home - of note patient's wife is apparently currently  hospitalized at [facility], intubated  # ICH After fall, stable on repeat head ct   # Dementia, advanced No behavioral disturbance  # Hyponatremia Chronic likely 2/2 adrenal insufficiency, head trauma likely contributing - now comfort care  # Nasal bone fracture After fall, not complaining of pain  # Adrenal insufficiency On prednisone  at home - will continue here for comfort  # Pulmonary opacity On cxr, no clinical s/s of pneumonia  # Hypothyroid Tsh wnl - home synthroid discontinued  # A-fib Rate controlled currently. Has pacer. - home meds held  # HFrEF Ef less than 20, mild rv dysfunction, grade 3 dd, intolerant of all gdmt. Does not appear volume up  # Constipation No documented bm - started miralax/senna - received lactulose yesterday, today will try milk of magnesia - consider enema   DVT prophylaxis: none Code Status: dnr/dni Family Communication: daughter montie telephonically 10/27  Level of care: Med-Surg Status is: Inpatient Remains inpatient appropriate because: unsafe dispo    Consultants:  neurosurgery  Procedures: none  Antimicrobials:  S/p ceftriaxone/azithromycin    Subjective: No complaints, sitting up in bed visiting with a friend. No BM yet  Objective: Vitals:   05/29/24 0813 05/29/24 1946 05/30/24 0814 05/30/24 0953  BP: 110/72 (!) 140/92 96/70 (!) 84/55  Pulse: 70 87 70 71  Resp: 15  14 16   Temp: 97.6 F (36.4 C) 97.7 F (36.5 C) (!) 97.5 F (36.4 C) 98.2 F (36.8 C)  TempSrc: Oral  Oral Oral  SpO2: 97% 100% 100% 100%  Weight:  Height:        Intake/Output Summary (Last 24 hours) at 05/30/2024 1420 Last data filed at 05/30/2024 1007 Gross per 24 hour  Intake 600 ml  Output 570 ml  Net 30 ml   Filed Weights   05/24/24 0628 05/24/24 1747  Weight: 76.4 kg 74.9 kg    Examination:  General exam: NAD, chronically ill appearing Respiratory system: normal wob Cardiovascular system: extremities  warm Gastrointestinal system: Abdomen is nondistended, soft   Central nervous system: awake, oriented to self Extremities: Symmetric 5 x 5 power. No edema Skin: laceration on nose hemostatic, some bruising on extremities and face Psychiatry: confused, calm    Data Reviewed: I have personally reviewed following labs and imaging studies  CBC: Recent Labs  Lab 05/24/24 0634 05/25/24 0256  WBC 4.9 7.3  NEUTROABS 3.1  --   HGB 12.0* 12.6*  HCT 35.8* 36.7*  MCV 89.1 88.0  PLT 129* 156   Basic Metabolic Panel: Recent Labs  Lab 05/24/24 0634 05/24/24 2311 05/25/24 0256  NA 132* 126* 127*  K 4.2 5.4* 5.0  CL 94* 91* 91*  CO2 27 26 24   GLUCOSE 65* 82 76  BUN 13 16 15   CREATININE 0.52* 0.71 0.49*  CALCIUM 8.5* 8.8* 8.8*  MG  --  1.8  --    GFR: Estimated Creatinine Clearance: 71.2 mL/min (A) (by C-G formula based on SCr of 0.49 mg/dL (L)). Liver Function Tests: Recent Labs  Lab 05/24/24 0634  AST 43*  ALT 43  ALKPHOS 110  BILITOT 0.9  PROT 6.5  ALBUMIN 3.2*   No results for input(s): LIPASE, AMYLASE in the last 168 hours. No results for input(s): AMMONIA in the last 168 hours. Coagulation Profile: No results for input(s): INR, PROTIME in the last 168 hours.  Cardiac Enzymes: Recent Labs  Lab 05/24/24 0819  CKTOTAL 84   BNP (last 3 results) No results for input(s): PROBNP in the last 8760 hours. HbA1C: No results for input(s): HGBA1C in the last 72 hours. CBG: Recent Labs  Lab 05/24/24 1024 05/24/24 1230 05/24/24 1556 05/24/24 1742 05/25/24 1949  GLUCAP 128* 72 83 103* 82   Lipid Profile: No results for input(s): CHOL, HDL, LDLCALC, TRIG, CHOLHDL, LDLDIRECT in the last 72 hours. Thyroid Function Tests: No results for input(s): TSH, T4TOTAL, FREET4, T3FREE, THYROIDAB in the last 72 hours.  Anemia Panel: No results for input(s): VITAMINB12, FOLATE, FERRITIN, TIBC, IRON, RETICCTPCT in the last 72  hours. Urine analysis:    Component Value Date/Time   COLORURINE YELLOW (A) 05/24/2024 0848   APPEARANCEUR CLEAR (A) 05/24/2024 0848   LABSPEC 1.011 05/24/2024 0848   PHURINE 6.0 05/24/2024 0848   GLUCOSEU NEGATIVE 05/24/2024 0848   HGBUR SMALL (A) 05/24/2024 0848   BILIRUBINUR NEGATIVE 05/24/2024 0848   KETONESUR NEGATIVE 05/24/2024 0848   PROTEINUR NEGATIVE 05/24/2024 0848   NITRITE NEGATIVE 05/24/2024 0848   LEUKOCYTESUR NEGATIVE 05/24/2024 0848   Sepsis Labs: @LABRCNTIP (procalcitonin:4,lacticidven:4)  ) Recent Results (from the past 240 hours)  Blood culture (routine x 2)     Status: None   Collection Time: 05/24/24  6:49 AM   Specimen: BLOOD LEFT FOREARM  Result Value Ref Range Status   Specimen Description BLOOD LEFT FOREARM  Final   Special Requests   Final    BOTTLES DRAWN AEROBIC AND ANAEROBIC Blood Culture adequate volume   Culture   Final    NO GROWTH 5 DAYS Performed at [facility], [address], [place], [place] [address]  Report Status 05/29/2024 FINAL  Final  Blood culture (routine x 2)     Status: None   Collection Time: 05/24/24  6:49 AM   Specimen: Right Antecubital; Blood  Result Value Ref Range Status   Specimen Description RIGHT ANTECUBITAL  Final   Special Requests   Final    BOTTLES DRAWN AEROBIC AND ANAEROBIC Blood Culture adequate volume   Culture   Final    NO GROWTH 5 DAYS Performed at [facility], [address] [place], [place] [address]    Report Status 05/29/2024 FINAL  Final  Resp panel by RT-PCR (RSV, Flu A&B, Covid) Anterior Nasal Swab     Status: None   Collection Time: 05/24/24  8:19 AM   Specimen: Anterior Nasal Swab  Result Value Ref Range Status   SARS Coronavirus 2 by RT PCR NEGATIVE NEGATIVE Final    Comment: (NOTE) SARS-CoV-2 target nucleic acids are NOT DETECTED.  The SARS-CoV-2 RNA is generally detectable in upper respiratory specimens during the acute phase of infection. The  lowest concentration of SARS-CoV-2 viral copies this assay can detect is 138 copies/mL. A negative result does not preclude SARS-Cov-2 infection and should not be used as the sole basis for treatment or other patient management decisions. A negative result may occur with  improper specimen collection/handling, submission of specimen other than nasopharyngeal swab, presence of viral mutation(s) within the areas targeted by this assay, and inadequate number of viral copies(<138 copies/mL). A negative result must be combined with clinical observations, patient history, and epidemiological information. The expected result is Negative.  Fact Sheet for Patients:  bloggercourse.com  Fact Sheet for Healthcare Providers:  seriousbroker.it  This test is no t yet approved or cleared by the [place]  FDA and  has been authorized for detection and/or diagnosis of SARS-CoV-2 by FDA under an Emergency Use Authorization (EUA). This EUA will remain  in effect (meaning this test can be used) for the duration of the COVID-19 declaration under Section 564(b)(1) of the Act, 21 U.S.C.section 360bbb-3(b)(1), unless the authorization is terminated  or revoked sooner.       Influenza A by PCR NEGATIVE NEGATIVE Final   Influenza B by PCR NEGATIVE NEGATIVE Final    Comment: (NOTE) The Xpert Xpress SARS-CoV-2/FLU/RSV plus assay is intended as an aid in the diagnosis of influenza from Nasopharyngeal swab specimens and should not be used as a sole basis for treatment. Nasal washings and aspirates are unacceptable for Xpert Xpress SARS-CoV-2/FLU/RSV testing.  Fact Sheet for Patients: bloggercourse.com  Fact Sheet for Healthcare Providers: seriousbroker.it  This test is not yet approved or cleared by the [place]  FDA and has been authorized for detection and/or diagnosis of SARS-CoV-2 by FDA under  an Emergency Use Authorization (EUA). This EUA will remain in effect (meaning this test can be used) for the duration of the COVID-19 declaration under Section 564(b)(1) of the Act, 21 U.S.C. section 360bbb-3(b)(1), unless the authorization is terminated or revoked.     Resp Syncytial Virus by PCR NEGATIVE NEGATIVE Final    Comment: (NOTE) Fact Sheet for Patients: bloggercourse.com  Fact Sheet for Healthcare Providers: seriousbroker.it  This test is not yet approved or cleared by the [place]  FDA and has been authorized for detection and/or diagnosis of SARS-CoV-2 by FDA under an Emergency Use Authorization (EUA). This EUA will remain in effect (meaning this test can be used) for the duration of the COVID-19 declaration under Section 564(b)(1) of the Act, 21 U.S.C. section 360bbb-3(b)(1), unless the  authorization is terminated or revoked.  Performed at [facility], [address] [place], [place] [address]   MRSA Next Gen by PCR, Nasal     Status: None   Collection Time: 05/24/24  5:49 PM   Specimen: Nasal Mucosa; Nasal Swab  Result Value Ref Range Status   MRSA by PCR Next Gen NOT DETECTED NOT DETECTED Final    Comment: (NOTE) The GeneXpert MRSA Assay (FDA approved for NASAL specimens only), is one component of a comprehensive MRSA colonization surveillance program. It is not intended to diagnose MRSA infection nor to guide or monitor treatment for MRSA infections. Test performance is not FDA approved in patients less than 51 years old. Performed at [facility], [address] [place], [place] [address]          Radiology Studies: No results found.       Scheduled Meds:  cyanocobalamin   1,000 mcg Oral Daily   polyethylene glycol  34 g Oral Daily   predniSONE   5 mg Oral Q breakfast   QUEtiapine   12.5 mg Oral QHS   senna  1 tablet Oral Daily   Continuous Infusions:   LOS: 6 days       Devaughn KATHEE Ban, MD [facility]   If 7PM-7AM, please contact night-coverage [email or web address] Password [email or web address] 05/30/2024, 2:20 PM

## 2024-05-30 NOTE — Plan of Care (Signed)

## 2024-05-31 DIAGNOSIS — S066X0A Traumatic subarachnoid hemorrhage without loss of consciousness, initial encounter: Secondary | ICD-10-CM | POA: Diagnosis not present

## 2024-05-31 NOTE — TOC Initial Note (Addendum)
 Transition of Care [facility]) - Initial/Assessment Note    Patient Details  Name: Bob Ortiz MRN: [id number] Date of Birth: Dec 24, 1940  [facility] CM/SW Contact:    Marinda Cooks, RN Phone Number: 05/31/2024, 9:10 AM  Clinical Narrative:                 This CM spoke with pt's (Daughter) [name] Kerns introduced role and completed Initial assessement. Ms. [name] informed  pt  lives with wife,  herself, and grandchildren   in a split level home with 9 steps enter. Pt has family support provided by family Pt uses [facility] & PCP is current with Dr. Salli at [facility]. Pt has DME at home including Hospital bed, BSC,& RW  that was coordinated by [facility] care with DC from SNF. Pt  was at [facility] for rehab prior to  this admission from dates 09/15 thru 09/29 , at dc pt was coordinated with [facility] with [facility] per Ms. [name] .  DC Planning: This CM discussed in detail pt's DC Plan with Ms. [name] and she expressed she would like to move forward with continued [facility] Hosp[facility] services via [facility] . Medical Team updated.TOC will cont to follow pt dc planning / care coordination during hospital stay and update as applicable.    13:55pm- This CM updated by [facility] POC Saddie she spoke with pt's daughter [name] in detail and was informed Ms. [name] wants to move forward with SNF 1st choice is [facility].      16:14 pm- This CM updated by Admission liaison at [facility] that pt owes a was admitted there prior from 09/15 thru 09/29 and will have to pay a outstanding balance of $1100 prior to being able to readmit. Pt's Daughter [name] made aware and informed she was going to call facility regarding this and f/u.      Expected Discharge Plan and Services    SNF for rehab             Prior Living Arrangements/Services    With Family     Activities of Daily Living   ADL Screening (condition at time of admission) Independently  performs ADLs?: No Does the patient have a NEW difficulty with bathing/dressing/toileting/self-feeding that is expected to last >3 days?: No Does the patient have a NEW difficulty with getting in/out of bed, walking, or climbing stairs that is expected to last >3 days?: No Does the patient have a NEW difficulty with communication that is expected to last >3 days?: No Is the patient deaf or have difficulty hearing?: No Does the patient have difficulty seeing, even when wearing glasses/contacts?: No Does the patient have difficulty concentrating, remembering, or making decisions?: Yes  Permission Sought/Granted                  Emotional Assessment              Admission diagnosis:  Subdural hemorrhage (HCC) [I62.00] Hypoglycemia [E16.2] Generalized weakness [R53.1] Multiple falls [R29.6] Hypothermia, initial encounter [T68.XXXA] Patient Active Problem List   Diagnosis Date Noted   Head trauma 05/24/2024   Traumatic subarachnoid hemorrhage without loss of consciousness, initial encounter (HCC) 05/24/2024   Subdural hemorrhage (HCC) 05/24/2024   Hypothermia 05/24/2024   HFrEF (heart failure with reduced ejection fraction) (HCC) 05/24/2024   CAP (community acquired pneumonia) 05/24/2024   Adrenal insufficiency (Addison's disease) (HCC) 05/24/2024   Dementia (HCC) 04/17/2024   Hyponatremia 04/10/2024   GERD without  esophagitis 04/07/2024   Hypoglycemia 04/06/2024   Acute on chronic congestive heart failure (HCC) 12/21/2023   Episode of shaking 12/21/2023   Seizure (HCC) 12/13/2023   Bilateral pleural effusion 12/13/2023   Elevated troponin 12/13/2023   Atrial fibrillation, chronic (HCC) 12/13/2023   Essential hypertension 12/13/2023   Transaminitis 12/13/2023   NICM (nonischemic cardiomyopathy) (HCC) 12/13/2023   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 [phone] [facility], [place] - [address] AT [facility] [address] [facility] [place] [address] Phone: [phone] Fax: [phone]  [id number] - [place], [place] - [address] [place] [address] Phone: [phone] Fax: [phone]     Social Drivers of Health (SDOH) Social History: SDOH Screenings   Food Insecurity: No Food Insecurity (04/07/2024)  Housing: Low Risk  (04/07/2024)  Transportation Needs: No Transportation Needs (05/25/2024)  Utilities: Not At Risk (05/25/2024)  Financial Resource Strain: Low Risk  (02/08/2024)   Received from [facility]  Physical Activity: Sufficiently Active (11/01/2023)   Received from [facility]  Social Connections: Moderately Isolated (04/07/2024)  Tobacco Use: Medium Risk (05/24/2024)   SDOH Interventions:     Readmission Risk Interventions     No data to display

## 2024-05-31 NOTE — Progress Notes (Signed)
 [facility] Room 130 [facility] Hospice Liaison Note   Hospital Liaison team will follow peripherally through discharge disposition. Patient is  currently followed by [facility]'s outpatient based palliative care program. [facility] can provide palliative care services at [facility] rehab, or hospice services at a LTC facility.   Spoke with patient's daughter, [name] today via phone.  Her mother remains at [facility] on the vent.  She would like to accept STR bed at [facility] if they are offering for the remaining 10 days he has remaining for STR.  Then she would like hospice after discharge from STR.  I gave [name] instructions on how to notify [facility] after STR days are completed.   Please call with any hospice  or palliative care related questions or concerns.   Thank you for the opportunity to participate in this patient's care.  Saddie HILARIO Na, RN Nurse Liaison [phone]

## 2024-05-31 NOTE — Plan of Care (Signed)

## 2024-05-31 NOTE — Progress Notes (Signed)
 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  10/22: admitted to hospitalist - neurosurgery consult, no surgery needed and recs consult neuro if seizure activity/ Follow CT. Tx pneumonia.   10/23: transition to comfort measures only status and beginning process of finding appropriate dispo / hospice care  10/29: family would now like to move forward with home hospice AFTER rehab     Consultants:  Neurosurgery   Procedures/Surgeries: none      ASSESSMENT & PLAN:   # Goals of care Per notes After discussion of baseline (end-stage chf, advanced dementia, frequent falls), family elected on 10/23 to pursue full comfort care. Now pursuing safe disposition. Not currently candidate for inpatient hospice. 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 Has pacer. home meds held d/t low BP /comfort  measures    # HFrEF Ef less than 20, mild rv dysfunction, grade 3 dd, intolerant of all gdmt. Does not appear volume up Monitor / diruese prn    # Constipation Bowel regimen / prn     No concerns based on BMI: Body mass index is 24.38 kg/m.SABRA Significantly low or high BMI is associated with higher medical risk.  Underweight - under 18  overweight - 25 to 29 obese - 30 or more Class 1 obesity: BMI of 30.0 to 34 Class 2 obesity: BMI of 35.0 to 39 Class 3 obesity: BMI of 40.0 to 49 Super Morbid Obesity: BMI 50-59 Super-super Morbid Obesity: BMI 60+ Healthy nutrition and physical activity advised as adjunct to other disease management and risk reduction treatments    DVT prophylaxis: n/a on comfort measures IV fluids: n/a on comfort measures Nutrition: tube feeds / as desired for comfort Central lines / other devices: G tube  Code Status: DNR ACP documentation reviewed: has DNR and advanced directive on file in [facility]  [facility] needs: placement Medical barriers to dispo: none.              Subjective / Brief ROS:  Patient reports no concerns He is speaking very quietly (Family states you have to tell him to yell or speak up) but clearly denies pain and has no questions or problems    Family Communication: support persons are at bedside    Objective Findings:  Vitals:   05/30/24 0953 05/30/24 2049 05/31/24 0531 05/31/24 0808  BP: (!) 84/55 120/84 118/74 107/67  Pulse: 71 78 70 71  Resp: 16 18 18 16   Temp: 98.2 F (36.8 C) (!) 97.4 F (36.3 C) 98.7 F (37.1 C) 98 F (36.7 C)  TempSrc: Oral     SpO2: 100% 100% 99% 100%  Weight:      Height:        Intake/Output Summary (Last 24 hours) at 05/31/2024 1409 Last data filed at 05/30/2024 1900 Gross per 24 hour  Intake 180 ml  Output --  Net 180 ml   Filed Weights   05/24/24 0628 05/24/24 1747  Weight: 76.4 kg 74.9 kg    Examination:  Physical Exam Constitutional:      General: He is not in acute  distress. Cardiovascular:     Rate and Rhythm: Normal rate and regular rhythm.  Pulmonary:     Effort: Pulmonary effort is normal.     Breath sounds: Normal breath sounds.  Neurological:     Mental Status: He is alert. Mental status is at baseline.     Comments: Friends/family at bedside state he is at his baseline today   Psychiatric:        Behavior: Behavior normal.          Scheduled Medications:   cyanocobalamin   1,000 mcg Oral Daily   polyethylene glycol  34 g Oral Daily   predniSONE   5 mg Oral Q breakfast   QUEtiapine   12.5 mg Oral QHS   senna  1 tablet Oral Daily    Continuous Infusions:   PRN Medications:  acetaminophen  **OR** acetaminophen , artificial tears, diphenhydrAMINE, glycopyrrolate **OR** glycopyrrolate **OR** glycopyrrolate, haloperidol  lactate, midazolam PF, morphine injection, ondansetron  **OR** ondansetron  (ZOFRAN ) IV, senna-docusate  Antimicrobials from admission:  Anti-infectives (From admission, onward)    Start     Dose/Rate Route Frequency Ordered Stop   05/25/24 1000  cefTRIAXone (ROCEPHIN) 2 g in sodium chloride  0.9 % 100 mL IVPB  Status:  Discontinued        2 g 200 mL/hr over 30 Minutes Intravenous Every 24 hours 05/24/24 1026 05/25/24 0937   05/25/24 1000  azithromycin (ZITHROMAX) 500 mg in sodium chloride  0.9 % 250 mL IVPB  Status:  Discontinued        500 mg 250 mL/hr over 60 Minutes Intravenous Every 24 hours 05/24/24 1026 05/25/24 0937   05/24/24 0845  cefTRIAXone (ROCEPHIN) 1 g in sodium chloride  0.9 % 100 mL IVPB        1 g 200 mL/hr over 30 Minutes Intravenous  Once 05/24/24 0836 05/24/24 0942   05/24/24 0845  azithromycin (ZITHROMAX) 500 mg in sodium chloride  0.9 % 250 mL IVPB        500 mg 250 mL/hr over 60 Minutes Intravenous  Once 05/24/24 9163 05/24/24 1220           Data Reviewed:  I have personally reviewed the following...  CBC: Recent Labs  Lab 05/25/24 0256  WBC 7.3  HGB 12.6*  HCT 36.7*  MCV 88.0   PLT 156   Basic Metabolic Panel: Recent Labs  Lab 05/24/24 2311 05/25/24 0256  NA 126* 127*  K 5.4* 5.0  CL 91* 91*  CO2 26 24  GLUCOSE 82 76  BUN 16 15  CREATININE 0.71 0.49*  CALCIUM 8.8* 8.8*  MG 1.8  --    GFR: Estimated Creatinine Clearance: 71.2 mL/min (A) (by C-G formula based on SCr of 0.49 mg/dL (L)). Liver Function Tests: No results for input(s): AST, ALT, ALKPHOS, BILITOT, PROT, ALBUMIN in the last 168 hours. No results for input(s): LIPASE,  AMYLASE in the last 168 hours. No results for input(s): AMMONIA in the last 168 hours. Coagulation Profile: No results for input(s): INR, PROTIME in the last 168 hours. Cardiac Enzymes: No results for input(s): CKTOTAL, CKMB, CKMBINDEX, TROPONINI in the last 168 hours. BNP (last 3 results) No results for input(s): PROBNP in the last 8760 hours. HbA1C: No results for input(s): HGBA1C in the last 72 hours. CBG: Recent Labs  Lab 05/24/24 1556 05/24/24 1742 05/25/24 1949  GLUCAP 83 103* 82   Lipid Profile: No results for input(s): CHOL, HDL, LDLCALC, TRIG, CHOLHDL, LDLDIRECT in the last 72 hours. Thyroid Function Tests: No results for input(s): TSH, T4TOTAL, FREET4, T3FREE, THYROIDAB in the last 72 hours. Anemia Panel: No results for input(s): VITAMINB12, FOLATE, FERRITIN, TIBC, IRON, RETICCTPCT in the last 72 hours. Most Recent Urinalysis On File:     Component Value Date/Time   COLORURINE YELLOW (A) 05/24/2024 0848   APPEARANCEUR CLEAR (A) 05/24/2024 0848   LABSPEC 1.011 05/24/2024 0848   PHURINE 6.0 05/24/2024 0848   GLUCOSEU NEGATIVE 05/24/2024 0848   HGBUR SMALL (A) 05/24/2024 0848   BILIRUBINUR NEGATIVE 05/24/2024 0848   KETONESUR NEGATIVE 05/24/2024 0848   PROTEINUR NEGATIVE 05/24/2024 0848   NITRITE NEGATIVE 05/24/2024 0848   LEUKOCYTESUR NEGATIVE 05/24/2024 0848   Sepsis  Labs: @LABRCNTIP (procalcitonin:4,lacticidven:4) Microbiology: Recent Results (from the past 240 hours)  Blood culture (routine x 2)     Status: None   Collection Time: 05/24/24  6:49 AM   Specimen: BLOOD LEFT FOREARM  Result Value Ref Range Status   Specimen Description BLOOD LEFT FOREARM  Final   Special Requests   Final    BOTTLES DRAWN AEROBIC AND ANAEROBIC Blood Culture adequate volume   Culture   Final    NO GROWTH 5 DAYS Performed at [facility], [address] [place], [place] [address]    Report Status 05/29/2024 FINAL  Final  Blood culture (routine x 2)     Status: None   Collection Time: 05/24/24  6:49 AM   Specimen: Right Antecubital; Blood  Result Value Ref Range Status   Specimen Description RIGHT ANTECUBITAL  Final   Special Requests   Final    BOTTLES DRAWN AEROBIC AND ANAEROBIC Blood Culture adequate volume   Culture   Final    NO GROWTH 5 DAYS Performed at [facility], [address] [place], [place] [address]    Report Status 05/29/2024 FINAL  Final  Resp panel by RT-PCR (RSV, Flu A&B, Covid) Anterior Nasal Swab     Status: None   Collection Time: 05/24/24  8:19 AM   Specimen: Anterior Nasal Swab  Result Value Ref Range Status   SARS Coronavirus 2 by RT PCR NEGATIVE NEGATIVE Final    Comment: (NOTE) SARS-CoV-2 target nucleic acids are NOT DETECTED.  The SARS-CoV-2 RNA is generally detectable in upper respiratory specimens during the acute phase of infection. The lowest concentration of SARS-CoV-2 viral copies this assay can detect is 138 copies/mL. A negative result does not preclude SARS-Cov-2 infection and should not be used as the sole basis for treatment or other patient management decisions. A negative result may occur with  improper specimen collection/handling, submission of specimen other than nasopharyngeal swab, presence of viral mutation(s) within the areas targeted by this assay, and inadequate number of  viral copies(<138 copies/mL). A negative result must be combined with clinical observations, patient history, and epidemiological information. The expected result is Negative.  Fact Sheet for Patients:  bloggercourse.com  Fact  Sheet for Healthcare Providers:  seriousbroker.it  This test is no t yet approved or cleared by the [place]  FDA and  has been authorized for detection and/or diagnosis of SARS-CoV-2 by FDA under an Emergency Use Authorization (EUA). This EUA will remain  in effect (meaning this test can be used) for the duration of the COVID-19 declaration under Section 564(b)(1) of the Act, 21 U.S.C.section 360bbb-3(b)(1), unless the authorization is terminated  or revoked sooner.       Influenza A by PCR NEGATIVE NEGATIVE Final   Influenza B by PCR NEGATIVE NEGATIVE Final    Comment: (NOTE) The Xpert Xpress SARS-CoV-2/FLU/RSV plus assay is intended as an aid in the diagnosis of influenza from Nasopharyngeal swab specimens and should not be used as a sole basis for treatment. Nasal washings and aspirates are unacceptable for Xpert Xpress SARS-CoV-2/FLU/RSV testing.  Fact Sheet for Patients: bloggercourse.com  Fact Sheet for Healthcare Providers: seriousbroker.it  This test is not yet approved or cleared by the [place]  FDA and has been authorized for detection and/or diagnosis of SARS-CoV-2 by FDA under an Emergency Use Authorization (EUA). This EUA will remain in effect (meaning this test can be used) for the duration of the COVID-19 declaration under Section 564(b)(1) of the Act, 21 U.S.C. section 360bbb-3(b)(1), unless the authorization is terminated or revoked.     Resp Syncytial Virus by PCR NEGATIVE NEGATIVE Final    Comment: (NOTE) Fact Sheet for Patients: bloggercourse.com  Fact Sheet for Healthcare  Providers: seriousbroker.it  This test is not yet approved or cleared by the [place]  FDA and has been authorized for detection and/or diagnosis of SARS-CoV-2 by FDA under an Emergency Use Authorization (EUA). This EUA will remain in effect (meaning this test can be used) for the duration of the COVID-19 declaration under Section 564(b)(1) of the Act, 21 U.S.C. section 360bbb-3(b)(1), unless the authorization is terminated or revoked.  Performed at [facility], [address] [place], [place] [address]   MRSA Next Gen by PCR, Nasal     Status: None   Collection Time: 05/24/24  5:49 PM   Specimen: Nasal Mucosa; Nasal Swab  Result Value Ref Range Status   MRSA by PCR Next Gen NOT DETECTED NOT DETECTED Final    Comment: (NOTE) The GeneXpert MRSA Assay (FDA approved for NASAL specimens only), is one component of a comprehensive MRSA colonization surveillance program. It is not intended to diagnose MRSA infection nor to guide or monitor treatment for MRSA infections. Test performance is not FDA approved in patients less than 14 years old. Performed at [facility], [address], [place], [place] [address]       Radiology Studies last 3 days: No results found.      Chidinma Clites, DO [facility] 05/31/2024, 2:09 PM    Dictation software may have been used to generate the above note. Typos may occur and escape review in typed/dictated notes. Please contact Dr Marsa directly for clarity if needed.  Staff may message me via secure chat in Epic  but this may not receive an immediate response,  please page me for urgent matters!  If 7PM-7AM, please contact night coverage [email or web address]

## 2024-05-31 NOTE — Progress Notes (Signed)
 Physical Therapy Treatment Patient Details Name: Bob Ortiz MRN: [id number] DOB: [birth year] Today's Date: 05/31/2024   History of Present Illness Pt is an 83 y/o M presenting to ED after sustaining a fall at home with subsequent AMS. CT imaging found new parafalcine subdural or subarachnoid hemorrhage and minimally displaced L nasal bone fractures. MD assessment includes ICH, encephalopathy, hyponatremia, nasal bone fx, pulmonary opacity. 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    PT Comments  Pt pleasantly confused, initially did not seem to interested in working with PT, but with more cuing showed some interest and with consistent encouragement he ultimately did quite well.   Pt was slow to transition to sitting EOB, did need light assist, but once up to standing he showed good effort with marching in place and then 2 bouts of ~25 ft of ambulation into the hallway with the walker.  No LOBs but needing constant directional cuing and reinforcement.  VSS t/o the effort, pt will benefit from continued PT to address functional limitations.     If plan is discharge home, recommend the following: A lot of help with walking and/or transfers;A lot of help with bathing/dressing/bathroom;Assistance with cooking/housework;Direct supervision/assist for medications management;Direct supervision/assist for financial management;Assist for transportation;Help with stairs or ramp for entrance;Supervision due to cognitive status   Can travel by private vehicle     Yes  Equipment Recommendations   (TBD at rehab)    Recommendations for Other Services       Precautions / Restrictions Precautions Precautions: Fall;ICD/Pacemaker Recall of Precautions/Restrictions: Impaired Restrictions Weight Bearing Restrictions Per Provider Order: No     Mobility  Bed Mobility Overal bed mobility: Needs Assistance Bed Mobility:  Supine to Sit, Sit to Supine     Supine to sit: Min assist Sit to supine: Min assist   General bed mobility comments: slow to initiate, needing excess cuing.  Did bulk of mobility w/o direct assist but constant cuing to complete/stay on tasks    Transfers Overall transfer level: Needs assistance Equipment used: Rolling walker (2 wheels) Transfers: Sit to/from Stand Sit to Stand: Min assist, Contact guard assist           General transfer comment: from standard height bed, cuing for all aspects of set up and sequencing, direct assist to initiate upward movement, walker reliant    Ambulation/Gait Ambulation/Gait assistance: Contact guard assist, Min assist Gait Distance (Feet): 50 Feet Assistive device: Rolling walker (2 wheels)         General Gait Details: pt continues to need constant cuing to stay on task, but did relatively well with 2 small ambulation loops with walker outside of the room, Pt's vitals remains stable and WNL during the effort   Stairs             Wheelchair Mobility     Tilt Bed    Modified Rankin (Stroke Patients Only)       Balance Overall balance assessment: Needs assistance Sitting-balance support: Feet supported Sitting balance-Leahy Scale: Fair     Standing balance support: Bilateral upper extremity supported, Reliant on assistive device for balance Standing balance-Leahy Scale: Fair Standing balance comment: no LOBs using walker, but  unsteady when trying to statically stand w/o it                            Communication Communication Communication: Impaired Factors Affecting Communication: Hearing impaired;Difficulty  expressing self  Cognition Arousal: Alert Behavior During Therapy: Flat affect   PT - Cognitive impairments: History of cognitive impairments, Orientation, Sequencing                         Following commands: Impaired Following commands impaired: Follows one step commands  inconsistently, Follows one step commands with increased time    Cueing Cueing Techniques: Verbal cues, Gestural cues, Tactile cues, Visual cues  Exercises      General Comments        Pertinent Vitals/Pain Pain Assessment Pain Assessment: No/denies pain    Home Living                          Prior Function            PT Goals (current goals can now be found in the care plan section) Progress towards PT goals: Progressing toward goals    Frequency    Min 2X/week      PT Plan      Co-evaluation              AM-PAC PT 6 Clicks Mobility   Outcome Measure  Help needed turning from your back to your side while in a flat bed without using bedrails?: A Little Help needed moving from lying on your back to sitting on the side of a flat bed without using bedrails?: A Little Help needed moving to and from a bed to a chair (including a wheelchair)?: A Little Help needed standing up from a chair using your arms (e.g., wheelchair or bedside chair)?: A Little Help needed to walk in hospital room?: A Little Help needed climbing 3-5 steps with a railing? : Total 6 Click Score: 16    End of Session Equipment Utilized During Treatment: Gait belt Activity Tolerance: Patient tolerated treatment well;Patient limited by fatigue Patient left: in bed;with call bell/phone within reach;with bed alarm set Nurse Communication: Mobility status PT Visit Diagnosis: Unsteadiness on feet (R26.81);Other abnormalities of gait and mobility (R26.89);Muscle weakness (generalized) (M62.81);Difficulty in walking, not elsewhere classified (R26.2)     Time: 8383-8369 PT Time Calculation (min) (ACUTE ONLY): 14 min  Charges:    $Gait Training: 8-22 mins PT General Charges $$ ACUTE PT VISIT: 1 Visit                     Carmin JONELLE Deed, DPT 05/31/2024, 5:09 PM

## 2024-06-01 DIAGNOSIS — S066X0A Traumatic subarachnoid hemorrhage without loss of consciousness, initial encounter: Secondary | ICD-10-CM | POA: Diagnosis not present

## 2024-06-01 NOTE — Plan of Care (Signed)
   Problem: Activity: Goal: Risk for activity intolerance will decrease Outcome: Progressing   Problem: Nutrition: Goal: Adequate nutrition will be maintained Outcome: Progressing

## 2024-06-01 NOTE — Progress Notes (Signed)
 PROGRESS NOTE    Bob Ortiz   FMW:[id number] DOB: 1941-05-11  DOA: 05/24/2024 Date of Service: 06/01/24 which is hospital day 8  PCP: Salli Amato, MD    Hospital course / significant events:   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  10/22: admitted to hospitalist - neurosurgery consult, no surgery needed and recs consult neuro if seizure activity/ Follow CT. Tx pneumonia.   10/23: transition to comfort measures only status and beginning process of finding appropriate dispo / hospice care  10/24-10/29: placement pending As of 10/30 family would now like to move forward with home hospice AFTER rehab, [facility] following      Consultants:  Neurosurgery   Procedures/Surgeries: none      ASSESSMENT & PLAN:   # Goals of care Per notes After discussion of baseline (end-stage chf, advanced dementia, frequent falls), family elected on 10/23 to pursue full comfort care. Now pursuing safe disposition. Not currently candidate for inpatient hospice. 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 Has pacer. home meds held d/t low BP /comfort measures    # HFrEF Ef less than 20, mild rv dysfunction, grade 3 dd, intolerant of all gdmt. Does not appear volume up Monitor / diruese prn    # Constipation Bowel regimen / prn     No concerns based on BMI: Body mass index is 24.38 kg/m.SABRA Significantly low or high BMI is associated with higher medical risk.  Underweight - under 18  overweight - 25 to 29 obese - 30 or more Class 1 obesity: BMI of 30.0 to 34 Class 2 obesity: BMI of 35.0 to 39 Class 3 obesity: BMI of 40.0 to 49 Super Morbid Obesity: BMI 50-59 Super-super Morbid Obesity: BMI 60+ Healthy nutrition and physical activity advised as adjunct to other disease management and risk reduction treatments    DVT prophylaxis: n/a on comfort measures IV fluids: n/a on comfort measures Nutrition: tube feeds / as desired for comfort Central lines / other devices: G tube  Code Status: DNR ACP documentation reviewed: has DNR and advanced directive on file in [facility]  [facility] needs: placement Medical barriers to dispo: none. DISCHARGE ORDER PLACED 05/31/24 PER 'CARE WITHOUT DELAY' PROTOCOL BASED ON MEDICAL STABILITY. PLEASE NOTE THAT FINAL DISPOSITION PLAN IS IN PROCESS. DO NOT DISCHARGE FROM [facility] FACILITY WITHOUT DISCUSSION W/ ATTENDING HOSPITALIST FIRST. THANKS.               Subjective / Brief ROS:  Patient reports no concerns Denies pain No questions   Family Communication:none at bedside, TOC  has been in communication w/ family, no medical updates happy to speak w/ them as needed    Objective Findings:  Vitals:   05/31/24 0531 05/31/24 0808 05/31/24 2005 06/01/24 0803  BP: 118/74 107/67 (!) 143/87 109/67  Pulse: 70 71 78 70  Resp: 18 16 16 16   Temp: 98.7 F (37.1 C) 98 F (36.7 C) 99.2 F (37.3 C) (!) 97.3 F (36.3 C)  TempSrc:   Oral   SpO2: 99% 100% 97% 97%   Weight:      Height:        Intake/Output Summary (Last 24 hours) at 06/01/2024 1259 Last data filed at 06/01/2024 0900 Gross per 24 hour  Intake 480 ml  Output --  Net 480 ml   Filed Weights   05/24/24 0628 05/24/24 1747  Weight: 76.4 kg 74.9 kg    Examination:  Physical Exam Constitutional:      General: He is not in acute distress. Cardiovascular:     Rate and Rhythm: Normal rate and regular rhythm.  Pulmonary:     Effort: Pulmonary effort is normal.     Breath sounds: Normal breath sounds.  Neurological:     Mental Status: He is alert. Mental status is at baseline.     Comments: Friends/family at bedside state he is at his baseline today   Psychiatric:        Behavior: Behavior normal.          Scheduled Medications:   cyanocobalamin   1,000 mcg Oral Daily   polyethylene glycol  34 g Oral Daily   predniSONE   5 mg Oral Q breakfast   QUEtiapine   12.5 mg Oral QHS   senna  1 tablet Oral Daily    Continuous Infusions:   PRN Medications:  acetaminophen  **OR** acetaminophen , artificial tears, diphenhydrAMINE, glycopyrrolate **OR** glycopyrrolate **OR** glycopyrrolate, haloperidol  lactate, midazolam PF, morphine injection, ondansetron  **OR** ondansetron  (ZOFRAN ) IV, senna-docusate  Antimicrobials from admission:  Anti-infectives (From admission, onward)    Start     Dose/Rate Route Frequency Ordered Stop   05/25/24 1000  cefTRIAXone (ROCEPHIN) 2 g in sodium chloride  0.9 % 100 mL IVPB  Status:  Discontinued        2 g 200 mL/hr over 30 Minutes Intravenous Every 24 hours 05/24/24 1026 05/25/24 0937   05/25/24 1000  azithromycin (ZITHROMAX) 500 mg in sodium chloride  0.9 % 250 mL IVPB  Status:  Discontinued        500 mg 250 mL/hr over 60 Minutes Intravenous Every 24 hours 05/24/24 1026 05/25/24 0937   05/24/24 0845  cefTRIAXone (ROCEPHIN) 1 g in sodium chloride  0.9 % 100 mL IVPB        1 g 200 mL/hr over 30 Minutes Intravenous  Once 05/24/24 0836 05/24/24  0942   05/24/24 0845  azithromycin (ZITHROMAX) 500 mg in sodium chloride  0.9 % 250 mL IVPB        500 mg 250 mL/hr over 60 Minutes Intravenous  Once 05/24/24 9163 05/24/24 1220           Data Reviewed:  I have personally reviewed the following...  CBC: No results for input(s): WBC, NEUTROABS, HGB, HCT, MCV, PLT in the last 168 hours.  Basic Metabolic Panel: No results for input(s): NA, K, CL, CO2, GLUCOSE, BUN, CREATININE, CALCIUM, MG, PHOS in the last 168 hours.  GFR: Estimated Creatinine Clearance: 71.2 mL/min (A) (by C-G formula based on SCr of 0.49 mg/dL (L)). Liver Function Tests: No results for input(s): AST, ALT, ALKPHOS, BILITOT,  PROT, ALBUMIN in the last 168 hours. No results for input(s): LIPASE, AMYLASE in the last 168 hours. No results for input(s): AMMONIA in the last 168 hours. Coagulation Profile: No results for input(s): INR, PROTIME in the last 168 hours. Cardiac Enzymes: No results for input(s): CKTOTAL, CKMB, CKMBINDEX, TROPONINI in the last 168 hours. BNP (last 3 results) No results for input(s): PROBNP in the last 8760 hours. HbA1C: No results for input(s): HGBA1C in the last 72 hours. CBG: Recent Labs  Lab 05/25/24 1949  GLUCAP 82   Lipid Profile: No results for input(s): CHOL, HDL, LDLCALC, TRIG, CHOLHDL, LDLDIRECT in the last 72 hours. Thyroid Function Tests: No results for input(s): TSH, T4TOTAL, FREET4, T3FREE, THYROIDAB in the last 72 hours. Anemia Panel: No results for input(s): VITAMINB12, FOLATE, FERRITIN, TIBC, IRON, RETICCTPCT in the last 72 hours. Most Recent Urinalysis On File:     Component Value Date/Time   COLORURINE YELLOW (A) 05/24/2024 0848   APPEARANCEUR CLEAR (A) 05/24/2024 0848   LABSPEC 1.011 05/24/2024 0848   PHURINE 6.0 05/24/2024 0848   GLUCOSEU NEGATIVE 05/24/2024 0848   HGBUR SMALL (A) 05/24/2024 0848   BILIRUBINUR  NEGATIVE 05/24/2024 0848   KETONESUR NEGATIVE 05/24/2024 0848   PROTEINUR NEGATIVE 05/24/2024 0848   NITRITE NEGATIVE 05/24/2024 0848   LEUKOCYTESUR NEGATIVE 05/24/2024 0848   Sepsis Labs: @LABRCNTIP (procalcitonin:4,lacticidven:4) Microbiology: Recent Results (from the past 240 hours)  Blood culture (routine x 2)     Status: None   Collection Time: 05/24/24  6:49 AM   Specimen: BLOOD LEFT FOREARM  Result Value Ref Range Status   Specimen Description BLOOD LEFT FOREARM  Final   Special Requests   Final    BOTTLES DRAWN AEROBIC AND ANAEROBIC Blood Culture adequate volume   Culture   Final    NO GROWTH 5 DAYS Performed at [facility], [address], [place], [place] [address]    Report Status 05/29/2024 FINAL  Final  Blood culture (routine x 2)     Status: None   Collection Time: 05/24/24  6:49 AM   Specimen: Right Antecubital; Blood  Result Value Ref Range Status   Specimen Description RIGHT ANTECUBITAL  Final   Special Requests   Final    BOTTLES DRAWN AEROBIC AND ANAEROBIC Blood Culture adequate volume   Culture   Final    NO GROWTH 5 DAYS Performed at [facility], [address] [place], [place] [address]    Report Status 05/29/2024 FINAL  Final  Resp panel by RT-PCR (RSV, Flu A&B, Covid) Anterior Nasal Swab     Status: None   Collection Time: 05/24/24  8:19 AM   Specimen: Anterior Nasal Swab  Result Value Ref Range Status   SARS Coronavirus 2 by RT PCR NEGATIVE NEGATIVE Final    Comment: (NOTE) SARS-CoV-2 target nucleic acids are NOT DETECTED.  The SARS-CoV-2 RNA is generally detectable in upper respiratory specimens during the acute phase of infection. The lowest concentration of SARS-CoV-2 viral copies this assay can detect is 138 copies/mL. A negative result does not preclude SARS-Cov-2 infection and should not be used as the sole basis for treatment or other patient management decisions. A negative result may occur with  improper specimen  collection/handling, submission of specimen other than nasopharyngeal swab, presence of viral mutation(s) within the areas targeted by this assay, and inadequate number of viral copies(<138 copies/mL). A negative result must be combined with clinical observations, patient history, and epidemiological information. The expected result is Negative.  Fact Sheet  for Patients:  bloggercourse.com  Fact Sheet for Healthcare Providers:  seriousbroker.it  This test is no t yet approved or cleared by the [place]  FDA and  has been authorized for detection and/or diagnosis of SARS-CoV-2 by FDA under an Emergency Use Authorization (EUA). This EUA will remain  in effect (meaning this test can be used) for the duration of the COVID-19 declaration under Section 564(b)(1) of the Act, 21 U.S.C.section 360bbb-3(b)(1), unless the authorization is terminated  or revoked sooner.       Influenza A by PCR NEGATIVE NEGATIVE Final   Influenza B by PCR NEGATIVE NEGATIVE Final    Comment: (NOTE) The Xpert Xpress SARS-CoV-2/FLU/RSV plus assay is intended as an aid in the diagnosis of influenza from Nasopharyngeal swab specimens and should not be used as a sole basis for treatment. Nasal washings and aspirates are unacceptable for Xpert Xpress SARS-CoV-2/FLU/RSV testing.  Fact Sheet for Patients: bloggercourse.com  Fact Sheet for Healthcare Providers: seriousbroker.it  This test is not yet approved or cleared by the [place]  FDA and has been authorized for detection and/or diagnosis of SARS-CoV-2 by FDA under an Emergency Use Authorization (EUA). This EUA will remain in effect (meaning this test can be used) for the duration of the COVID-19 declaration under Section 564(b)(1) of the Act, 21 U.S.C. section 360bbb-3(b)(1), unless the authorization is terminated or revoked.     Resp Syncytial  Virus by PCR NEGATIVE NEGATIVE Final    Comment: (NOTE) Fact Sheet for Patients: bloggercourse.com  Fact Sheet for Healthcare Providers: seriousbroker.it  This test is not yet approved or cleared by the [place]  FDA and has been authorized for detection and/or diagnosis of SARS-CoV-2 by FDA under an Emergency Use Authorization (EUA). This EUA will remain in effect (meaning this test can be used) for the duration of the COVID-19 declaration under Section 564(b)(1) of the Act, 21 U.S.C. section 360bbb-3(b)(1), unless the authorization is terminated or revoked.  Performed at [facility], [address] [place], [place] [address]   MRSA Next Gen by PCR, Nasal     Status: None   Collection Time: 05/24/24  5:49 PM   Specimen: Nasal Mucosa; Nasal Swab  Result Value Ref Range Status   MRSA by PCR Next Gen NOT DETECTED NOT DETECTED Final    Comment: (NOTE) The GeneXpert MRSA Assay (FDA approved for NASAL specimens only), is one component of a comprehensive MRSA colonization surveillance program. It is not intended to diagnose MRSA infection nor to guide or monitor treatment for MRSA infections. Test performance is not FDA approved in patients less than 21 years old. Performed at [facility], [address] [place], [place] [address]       Radiology Studies last 3 days: No results found.      Huber Mathers, DO [facility] 06/01/2024, 12:59 PM    Dictation software may have been used to generate the above note. Typos may occur and escape review in typed/dictated notes. Please contact Dr Marsa directly for clarity if needed.  Staff may message me via secure chat in Epic  but this may not receive an immediate response,  please page me for urgent matters!  If 7PM-7AM, please contact night coverage [email or web address]

## 2024-06-01 NOTE — Progress Notes (Signed)
 [facility] Room 130 [facility] Liaison Note   Hospital Liaison team will follow peripherally through discharge disposition. Patient is  currently followed by [facility]'s outpatient based palliative care program. [facility] can provide palliative care services at [facility] rehab, or hospice services at a LTC facility.     Please call with any hospice  or palliative care related questions or concerns.   Thank you for the opportunity to participate in this patient's care.  [facility] Liaison [phone]

## 2024-06-01 NOTE — Progress Notes (Signed)
 Mobility Specialist - Progress Note      06/01/24 1132  Mobility  Activity Stood at bedside;Ambulated with assistance;Dangled on edge of bed  Level of Assistance Contact guard assist, steadying assist  Assistive Device Front wheel walker  Distance Ambulated (ft) 70 ft  Range of Motion/Exercises Active  Activity Response Tolerated well  Mobility Referral Yes  Mobility visit 1 Mobility  Mobility Specialist Start Time (ACUTE ONLY) 1118  Mobility Specialist Stop Time (ACUTE ONLY) 1134  Mobility Specialist Time Calculation (min) (ACUTE ONLY) 16 min   Pt resting in bed on RA upon entry. Pt STS and ambulates to hallway around NS CGA/MinA for 70 ft. Pt required constant verbal and manual cuing to remain upright and keep walker straight. Pt returned to recliner and left with needs in reach and chair alarm activated.   Guido Rumble Mobility Specialist 06/01/24, 11:43 AM

## 2024-06-01 NOTE — Progress Notes (Incomplete)
 PROGRESS NOTE    IVERSON SEES   FMW:[id number] DOB: 1940-10-07  DOA: 05/24/2024 Date of Service: 06/01/24 which is hospital day 8  PCP: Salli Amato, MD    Hospital course / significant events:   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  10/22: admitted to hospitalist - neurosurgery consult, no surgery needed and recs consult neuro if seizure activity/ Follow CT. Tx pneumonia.   10/23: transition to comfort measures only status and beginning process of finding appropriate dispo / hospice care  10/24-10/29: placement pending As of 10/30 family would now like to move forward with home hospice AFTER rehab, [facility] following      Consultants:  Neurosurgery   Procedures/Surgeries: none      ASSESSMENT & PLAN:   # Goals of care Per notes After discussion of baseline (end-stage chf, advanced dementia, frequent falls), family elected on 10/23 to pursue full comfort care. Now pursuing safe disposition. Not currently candidate for inpatient hospice. 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 Has pacer. home meds held d/t low BP /comfort measures    # HFrEF Ef less than 20, mild rv dysfunction, grade 3 dd, intolerant of all gdmt. Does not appear volume up Monitor / diruese prn    # Constipation Bowel regimen / prn     No concerns based on BMI: Body mass index is 24.38 kg/m.SABRA Significantly low or high BMI is associated with higher medical risk.  Underweight - under 18  overweight - 25 to 29 obese - 30 or more Class 1 obesity: BMI of 30.0 to 34 Class 2 obesity: BMI of 35.0 to 39 Class 3 obesity: BMI of 40.0 to 49 Super Morbid Obesity: BMI 50-59 Super-super Morbid Obesity: BMI 60+ Healthy nutrition and physical activity advised as adjunct to other disease management and risk reduction treatments    DVT prophylaxis: n/a on comfort measures IV fluids: n/a on comfort measures Nutrition: tube feeds / as desired for comfort Central lines / other devices: G tube  Code Status: DNR ACP documentation reviewed: has DNR and advanced directive on file in [facility]  [facility] needs: placement Medical barriers to dispo: none. DISCHARGE ORDER PLACED 05/31/24 PER 'CARE WITHOUT DELAY' PROTOCOL BASED ON MEDICAL STABILITY. PLEASE NOTE THAT FINAL DISPOSITION PLAN IS IN PROCESS. DO NOT DISCHARGE FROM [facility] FACILITY WITHOUT DISCUSSION W/ ATTENDING HOSPITALIST FIRST. THANKS.               Subjective / Brief ROS:  Patient reports no concerns He is speaking very quietly (Family states you have to tell  him to yell or speak up) but clearly denies pain and has no questions or problems    Family Communication: support persons are at bedside    Objective Findings:  Vitals:   05/30/24 2049 05/31/24 0531 05/31/24 0808 05/31/24 2005  BP: 120/84 118/74 107/67 (!) 143/87  Pulse: 78 70 71 78  Resp: 18 18 16 16   Temp: (!) 97.4 F (36.3 C) 98.7 F (37.1 C) 98 F (36.7 C) 99.2 F (37.3 C)   TempSrc:    Oral  SpO2: 100% 99% 100% 97%  Weight:      Height:       No intake or output data in the 24 hours ending 06/01/24 0744  Filed Weights   05/24/24 0628 05/24/24 1747  Weight: 76.4 kg 74.9 kg    Examination:  Physical Exam Constitutional:      General: He is not in acute distress. Cardiovascular:     Rate and Rhythm: Normal rate and regular rhythm.  Pulmonary:     Effort: Pulmonary effort is normal.     Breath sounds: Normal breath sounds.  Neurological:     Mental Status: He is alert. Mental status is at baseline.     Comments: Friends/family at bedside state he is at his baseline today   Psychiatric:        Behavior: Behavior normal.          Scheduled Medications:   cyanocobalamin   1,000 mcg Oral Daily   polyethylene glycol  34 g Oral Daily   predniSONE   5 mg Oral Q breakfast   QUEtiapine   12.5 mg Oral QHS   senna  1 tablet Oral Daily    Continuous Infusions:   PRN Medications:  acetaminophen  **OR** acetaminophen , artificial tears, diphenhydrAMINE, glycopyrrolate **OR** glycopyrrolate **OR** glycopyrrolate, haloperidol  lactate, midazolam PF, morphine injection, ondansetron  **OR** ondansetron  (ZOFRAN ) IV, senna-docusate  Antimicrobials from admission:  Anti-infectives (From admission, onward)    Start     Dose/Rate Route Frequency Ordered Stop   05/25/24 1000  cefTRIAXone (ROCEPHIN) 2 g in sodium chloride  0.9 % 100 mL IVPB  Status:  Discontinued        2 g 200 mL/hr over 30 Minutes Intravenous Every 24 hours 05/24/24 1026 05/25/24 0937   05/25/24 1000  azithromycin (ZITHROMAX) 500 mg in sodium chloride  0.9 % 250 mL IVPB  Status:  Discontinued        500 mg 250 mL/hr over 60 Minutes Intravenous Every 24 hours 05/24/24 1026 05/25/24 0937   05/24/24 0845  cefTRIAXone (ROCEPHIN) 1 g in sodium chloride  0.9 % 100 mL IVPB        1 g 200 mL/hr over 30 Minutes Intravenous  Once 05/24/24 0836 05/24/24 0942   05/24/24 0845  azithromycin (ZITHROMAX) 500  mg in sodium chloride  0.9 % 250 mL IVPB        500 mg 250 mL/hr over 60 Minutes Intravenous  Once 05/24/24 9163 05/24/24 1220           Data Reviewed:  I have personally reviewed the following...  CBC: No results for input(s): WBC, NEUTROABS, HGB, HCT, MCV, PLT in the last 168 hours.  Basic Metabolic Panel: No results for input(s): NA, K, CL, CO2, GLUCOSE, BUN, CREATININE, CALCIUM, MG, PHOS in the last 168 hours.  GFR: Estimated Creatinine Clearance: 71.2 mL/min (A) (by C-G formula based on SCr of 0.49 mg/dL (L)). Liver Function Tests: No results for input(s): AST, ALT, ALKPHOS, BILITOT, PROT, ALBUMIN in the last 168 hours. No results  for input(s): LIPASE, AMYLASE in the last 168 hours. No results for input(s): AMMONIA in the last 168 hours. Coagulation Profile: No results for input(s): INR, PROTIME in the last 168 hours. Cardiac Enzymes: No results for input(s): CKTOTAL, CKMB, CKMBINDEX, TROPONINI in the last 168 hours. BNP (last 3 results) No results for input(s): PROBNP in the last 8760 hours. HbA1C: No results for input(s): HGBA1C in the last 72 hours. CBG: Recent Labs  Lab 05/25/24 1949  GLUCAP 82   Lipid Profile: No results for input(s): CHOL, HDL, LDLCALC, TRIG, CHOLHDL, LDLDIRECT in the last 72 hours. Thyroid Function Tests: No results for input(s): TSH, T4TOTAL, FREET4, T3FREE, THYROIDAB in the last 72 hours. Anemia Panel: No results for input(s): VITAMINB12, FOLATE, FERRITIN, TIBC, IRON, RETICCTPCT in the last 72 hours. Most Recent Urinalysis On File:     Component Value Date/Time   COLORURINE YELLOW (A) 05/24/2024 0848   APPEARANCEUR CLEAR (A) 05/24/2024 0848   LABSPEC 1.011 05/24/2024 0848   PHURINE 6.0 05/24/2024 0848   GLUCOSEU NEGATIVE 05/24/2024 0848   HGBUR SMALL (A) 05/24/2024 0848   BILIRUBINUR NEGATIVE 05/24/2024 0848   KETONESUR NEGATIVE  05/24/2024 0848   PROTEINUR NEGATIVE 05/24/2024 0848   NITRITE NEGATIVE 05/24/2024 0848   LEUKOCYTESUR NEGATIVE 05/24/2024 0848   Sepsis Labs: @LABRCNTIP (procalcitonin:4,lacticidven:4) Microbiology: Recent Results (from the past 240 hours)  Blood culture (routine x 2)     Status: None   Collection Time: 05/24/24  6:49 AM   Specimen: BLOOD LEFT FOREARM  Result Value Ref Range Status   Specimen Description BLOOD LEFT FOREARM  Final   Special Requests   Final    BOTTLES DRAWN AEROBIC AND ANAEROBIC Blood Culture adequate volume   Culture   Final    NO GROWTH 5 DAYS Performed at [facility], [address], [place], [place] [address]    Report Status 05/29/2024 FINAL  Final  Blood culture (routine x 2)     Status: None   Collection Time: 05/24/24  6:49 AM   Specimen: Right Antecubital; Blood  Result Value Ref Range Status   Specimen Description RIGHT ANTECUBITAL  Final   Special Requests   Final    BOTTLES DRAWN AEROBIC AND ANAEROBIC Blood Culture adequate volume   Culture   Final    NO GROWTH 5 DAYS Performed at [facility], [address] [place], [place] [address]    Report Status 05/29/2024 FINAL  Final  Resp panel by RT-PCR (RSV, Flu A&B, Covid) Anterior Nasal Swab     Status: None   Collection Time: 05/24/24  8:19 AM   Specimen: Anterior Nasal Swab  Result Value Ref Range Status   SARS Coronavirus 2 by RT PCR NEGATIVE NEGATIVE Final    Comment: (NOTE) SARS-CoV-2 target nucleic acids are NOT DETECTED.  The SARS-CoV-2 RNA is generally detectable in upper respiratory specimens during the acute phase of infection. The lowest concentration of SARS-CoV-2 viral copies this assay can detect is 138 copies/mL. A negative result does not preclude SARS-Cov-2 infection and should not be used as the sole basis for treatment or other patient management decisions. A negative result may occur with  improper specimen collection/handling, submission of specimen  other than nasopharyngeal swab, presence of viral mutation(s) within the areas targeted by this assay, and inadequate number of viral copies(<138 copies/mL). A negative result must be combined with clinical observations, patient history, and epidemiological information. The expected result is Negative.  Fact Sheet for Patients:  bloggercourse.com  Fact Sheet for Healthcare  Providers:  seriousbroker.it  This test is no t yet approved or cleared by the [place]  FDA and  has been authorized for detection and/or diagnosis of SARS-CoV-2 by FDA under an Emergency Use Authorization (EUA). This EUA will remain  in effect (meaning this test can be used) for the duration of the COVID-19 declaration under Section 564(b)(1) of the Act, 21 U.S.C.section 360bbb-3(b)(1), unless the authorization is terminated  or revoked sooner.       Influenza A by PCR NEGATIVE NEGATIVE Final   Influenza B by PCR NEGATIVE NEGATIVE Final    Comment: (NOTE) The Xpert Xpress SARS-CoV-2/FLU/RSV plus assay is intended as an aid in the diagnosis of influenza from Nasopharyngeal swab specimens and should not be used as a sole basis for treatment. Nasal washings and aspirates are unacceptable for Xpert Xpress SARS-CoV-2/FLU/RSV testing.  Fact Sheet for Patients: bloggercourse.com  Fact Sheet for Healthcare Providers: seriousbroker.it  This test is not yet approved or cleared by the [place]  FDA and has been authorized for detection and/or diagnosis of SARS-CoV-2 by FDA under an Emergency Use Authorization (EUA). This EUA will remain in effect (meaning this test can be used) for the duration of the COVID-19 declaration under Section 564(b)(1) of the Act, 21 U.S.C. section 360bbb-3(b)(1), unless the authorization is terminated or revoked.     Resp Syncytial Virus by PCR NEGATIVE NEGATIVE Final     Comment: (NOTE) Fact Sheet for Patients: bloggercourse.com  Fact Sheet for Healthcare Providers: seriousbroker.it  This test is not yet approved or cleared by the [place]  FDA and has been authorized for detection and/or diagnosis of SARS-CoV-2 by FDA under an Emergency Use Authorization (EUA). This EUA will remain in effect (meaning this test can be used) for the duration of the COVID-19 declaration under Section 564(b)(1) of the Act, 21 U.S.C. section 360bbb-3(b)(1), unless the authorization is terminated or revoked.  Performed at [facility], [address] [place], [place] [address]   MRSA Next Gen by PCR, Nasal     Status: None   Collection Time: 05/24/24  5:49 PM   Specimen: Nasal Mucosa; Nasal Swab  Result Value Ref Range Status   MRSA by PCR Next Gen NOT DETECTED NOT DETECTED Final    Comment: (NOTE) The GeneXpert MRSA Assay (FDA approved for NASAL specimens only), is one component of a comprehensive MRSA colonization surveillance program. It is not intended to diagnose MRSA infection nor to guide or monitor treatment for MRSA infections. Test performance is not FDA approved in patients less than 48 years old. Performed at [facility], [address] [place], [place] [address]       Radiology Studies last 3 days: No results found.      Tabbetha Kutscher, DO [facility] 06/01/2024, 7:44 AM    Dictation software may have been used to generate the above note. Typos may occur and escape review in typed/dictated notes. Please contact Dr Marsa directly for clarity if needed.  Staff may message me via secure chat in Epic  but this may not receive an immediate response,  please page me for urgent matters!  If 7PM-7AM, please contact night coverage [email or web address]

## 2024-06-02 DIAGNOSIS — S066X0A Traumatic subarachnoid hemorrhage without loss of consciousness, initial encounter: Secondary | ICD-10-CM | POA: Diagnosis not present

## 2024-06-02 MED ORDER — POLYETHYLENE GLYCOL 3350 17 G PO PACK: 34.0000 g | PACK | Freq: Every day | ORAL | Status: AC

## 2024-06-02 MED ORDER — ONDANSETRON HCL 4 MG PO TABS: 4.0000 mg | ORAL_TABLET | Freq: Four times a day (QID) | ORAL | Status: AC | PRN

## 2024-06-02 MED ORDER — SENNOSIDES-DOCUSATE SODIUM 8.6-50 MG PO TABS: 1.0000 | ORAL_TABLET | Freq: Every evening | ORAL | Status: AC | PRN

## 2024-06-02 MED ORDER — POLYVINYL ALCOHOL 1.4 % OP SOLN: 1.0000 [drp] | Freq: Four times a day (QID) | OPHTHALMIC | Status: AC | PRN

## 2024-06-02 MED ORDER — GLYCOPYRROLATE 1 MG PO TABS: 1.0000 mg | ORAL_TABLET | ORAL | Status: AC | PRN

## 2024-06-02 MED ORDER — DIPHENHYDRAMINE HCL 50 MG/ML IJ SOLN: 25.0000 mg | INTRAMUSCULAR | Status: AC | PRN

## 2024-06-02 MED ORDER — ACETAMINOPHEN 325 MG PO TABS: 650.0000 mg | ORAL_TABLET | Freq: Four times a day (QID) | ORAL | Status: AC | PRN

## 2024-06-02 NOTE — TOC Transition Note (Signed)
 Transition of Care [facility]) - Discharge Note   Patient Details  Name: Bob Ortiz MRN: [id number] Date of Birth: 31-May-1941  [facility] CM/SW Contact:  Dalia GORMAN Fuse, RN Phone Number: 06/02/2024, 2:56 PM   Clinical Narrative:    Patient is medically clear to discharge to [facility]. TOC spoke with the patient's daughter [name] and she is in agreement. She will go to the facility to sign the patient in once she finishes her errands. [facility] will transport the patient.  Nurse to call report [facility] [phone]   Final next level of care: Skilled Nursing Facility Barriers to Discharge: Barriers Resolved   Patient Goals and CMS Choice            Discharge Placement              Patient chooses bed at: [facility] Patient to be transferred to facility by: [facility] Name of family member notified: [name] Patient and family notified of of transfer: 06/02/24  Discharge Plan and Services Additional resources added to the After Visit Summary for                                       Social Drivers of Health (SDOH) Interventions SDOH Screenings   Food Insecurity: No Food Insecurity (04/07/2024)  Housing: Low Risk  (04/07/2024)  Transportation Needs: No Transportation Needs (05/25/2024)  Utilities: Not At Risk (05/25/2024)  Financial Resource Strain: Low Risk  (02/08/2024)   Received from [facility]  Physical Activity: Sufficiently Active (11/01/2023)   Received from [facility]  Social Connections: Moderately Isolated (04/07/2024)  Tobacco Use: Medium Risk (05/24/2024)     Readmission Risk Interventions     No data to display

## 2024-06-02 NOTE — Progress Notes (Signed)
 Report given to nurse Elveria at [facility]. Patient awaiting EMS pick up.

## 2024-06-02 NOTE — Plan of Care (Signed)

## 2024-06-02 NOTE — TOC Progression Note (Deleted)
 Transition of Care [facility]) - Progression Note    Patient Details  Name: Bob Ortiz MRN: [id number] Date of Birth: June 05, 1941  Transition of Care [facility]) CM/SW Contact  Dalia GORMAN Fuse, RN Phone Number: 06/02/2024, 11:41 AM  Clinical Narrative:                         Expected Discharge Plan and Services         Expected Discharge Date: 05/31/24                                     Social Drivers of Health (SDOH) Interventions SDOH Screenings   Food Insecurity: No Food Insecurity (04/07/2024)  Housing: Low Risk  (04/07/2024)  Transportation Needs: No Transportation Needs (05/25/2024)  Utilities: Not At Risk (05/25/2024)  Financial Resource Strain: Low Risk  (02/08/2024)   Received from [facility]  Physical Activity: Sufficiently Active (11/01/2023)   Received from [facility]  Social Connections: Moderately Isolated (04/07/2024)  Tobacco Use: Medium Risk (05/24/2024)    Readmission Risk Interventions     No data to display

## 2024-06-02 NOTE — Discharge Summary (Signed)
 Physician Discharge Summary   Patient: Bob Ortiz MRN: [id number]  DOB: 08-02-1941   Admit:     Date of Admission: 05/24/2024 Admitted from: home   Discharge: Date of discharge: 06/02/24 Disposition: Skilled nursing facility Condition at discharge: fair/stable  CODE STATUS: DNR     Discharge Physician: Laneta Blunt, DO [facility]     PCP: Salli Amato, MD  Recommendations for Outpatient Follow-up:  Follow up with PCP Salli Amato, MD in 2-4 weeks         Discharge Diagnoses: Principal Problem:   Traumatic subarachnoid hemorrhage without loss of consciousness, initial encounter [facility]) Active Problems:   Elevated troponin   Hypoglycemia   CAP (community acquired pneumonia)   NICM (nonischemic cardiomyopathy) (HCC)   HFrEF (heart failure with reduced ejection fraction) (HCC)   Adrenal insufficiency (Addison's disease) (HCC)   GERD without esophagitis   Hyponatremia   Atrial fibrillation, chronic (HCC)   Essential hypertension   Head trauma   Subdural hemorrhage (HCC)   Hypothermia      Hospital course / significant events:   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  10/22: admitted to hospitalist - neurosurgery consult, no surgery needed and recs consult neuro if seizure activity/ Follow CT. Tx pneumonia.   10/23: transition to comfort measures only status and beginning process of finding appropriate dispo / hospice care  10/24-10/29: placement pending As of 10/30 family would now like to move forward with home hospice AFTER rehab, TOC following  10/31: placement confirmed      Consultants:  Neurosurgery   Procedures/Surgeries: none      ASSESSMENT & PLAN:   # Goals of care Per notes After discussion of baseline  (end-stage chf, advanced dementia, frequent falls), family elected on 10/23 to pursue full comfort care. Now pursuing safe disposition. Not currently candidate for inpatient hospice. Patient has stabilized and worked with PT TOC and palliative care are following, hopeful for SNF and then transition to home hospice  of note patient's wife is apparently currently hospitalized at [facility], intubated, this is understandably complicating matters    # ICH After fall, stable on repeat head ct  Treat seizure if these develop but otherwise monitor    # Dementia, advanced No behavioral disturbance Monitor / treat as needed   # Hyponatremia Chronic likely 2/2 adrenal insufficiency, head trauma likely contributing now comfort care, no labs    # Nasal bone fracture After fall, not complaining of pain   # Adrenal insufficiency On prednisone  at home will continue here for symptom mgt  # Hypothyroid Tsh wnl home synthroid ok to continue to avoid myxedema symptoms    # A-fib Rate controlled currently. Has pacer. home meds held d/t low BP /comfort measures    # HFrEF Ef less than 20, mild rv dysfunction, grade 3 dd, intolerant of all gdmt. Does not appear volume up Monitor / diruese prn    # Constipation Bowel regimen / prn     No concerns based on BMI: Body mass index is 24.38 kg/m.SABRA Significantly low or high BMI is associated with higher medical risk.  Underweight - under 18  overweight - 25 to 29 obese - 30 or more Class 1 obesity: BMI of 30.0 to 34 Class 2 obesity: BMI of 35.0 to 39 Class 3 obesity: BMI of  40.0 to 49 Super Morbid Obesity: BMI 50-59 Super-super Morbid Obesity: BMI 60+ Healthy nutrition and physical activity advised as adjunct to other disease management and risk reduction treatments              Discharge Instructions  Allergies as of 06/02/2024       Reactions   Mirtazapine Other (See Comments)   Dizziness, lethargy, and fall   Quinapril Swelling    Bumetanide  Other (See Comments)   Torsemide  Other (See Comments)        Medication List     STOP taking these medications    carvedilol  3.125 MG tablet Commonly known as: COREG    Eliquis  5 MG Tabs tablet Generic drug: apixaban    magnesium  oxide 400 (240 Mg) MG tablet Commonly known as: MAG-OX   melatonin 3 MG Tabs tablet   thiamine 100 MG tablet Commonly known as: VITAMIN B1   Vitamin D -1000 Max St 25 MCG (1000 UT) tablet Generic drug: Cholecalciferol        TAKE these medications    acetaminophen  325 MG tablet Commonly known as: TYLENOL  Take 2 tablets (650 mg total) by mouth every 6 (six) hours as needed for fever (Fever >/= 101).   artificial tears ophthalmic solution Place 1 drop into both eyes 4 (four) times daily as needed for dry eyes.   cyanocobalamin  1000 MCG tablet Commonly known as: VITAMIN B12 Take 1,000 mcg by mouth daily.   diphenhydrAMINE 50 MG/ML injection Commonly known as: BENADRYL Inject 0.5 mLs (25 mg total) into the vein every 4 (four) hours as needed for itching.   glycopyrrolate 1 MG tablet Commonly known as: ROBINUL Take 1 tablet (1 mg total) by mouth every 4 (four) hours as needed (excessive secretions).   ondansetron  4 MG tablet Commonly known as: ZOFRAN  Take 1 tablet (4 mg total) by mouth every 6 (six) hours as needed for nausea.   polyethylene glycol 17 g packet Commonly known as: MIRALAX / GLYCOLAX Take 34 g by mouth daily. Start taking on: June 03, 2024   predniSONE  5 MG tablet Commonly known as: DELTASONE  Take 1 tablet (5 mg total) by mouth daily with breakfast.   QUEtiapine  25 MG tablet Commonly known as: SEROQUEL  Take 0.5 tablets (12.5 mg total) by mouth at bedtime.   senna-docusate 8.6-50 MG tablet Commonly known as: Senokot-S Take 1 tablet by mouth at bedtime as needed for mild constipation.   torsemide  20 MG tablet Commonly known as: DEMADEX  Take 1 tablet (20 mg total) by mouth daily as needed (for  swelling). Take 1 tablet (20 mg total) by mouth once daily Take an extra 20 mg in the afternoon as needed         Contact information for after-discharge care     Destination     [facility] .   Service: Skilled Nursing Contact information: [address] [place]  [phone]                     Allergies  Allergen Reactions   Mirtazapine Other (See Comments)    Dizziness, lethargy, and fall   Quinapril Swelling   Bumetanide  Other (See Comments)   Torsemide  Other (See Comments)     Subjective: pt reports no complaints today    Discharge Exam: BP 106/66 (BP Location: Right Arm)   Pulse 71   Temp 97.8 F (36.6 C)   Resp 16   Ht 5' 9 (1.753 m)   Wt 74.9 kg  SpO2 99%   BMI 24.38 kg/m  General: Pt is alert, awake, not in acute distress Cardiovascular: RRR, S1/S2 +, no rubs, no gallops Respiratory: CTA bilaterally, no wheezing, no rhonchi Abdominal: Soft, NT, ND, bowel sounds + Extremities: no edema, no cyanosis     The results of significant diagnostics from this hospitalization (including imaging, microbiology, ancillary and laboratory) are listed below for reference.     Microbiology: Recent Results (from the past 240 hours)  Blood culture (routine x 2)     Status: None   Collection Time: 05/24/24  6:49 AM   Specimen: BLOOD LEFT FOREARM  Result Value Ref Range Status   Specimen Description BLOOD LEFT FOREARM  Final   Special Requests   Final    BOTTLES DRAWN AEROBIC AND ANAEROBIC Blood Culture adequate volume   Culture   Final    NO GROWTH 5 DAYS Performed at [facility], [address], [place], [place] [address]    Report Status 05/29/2024 FINAL  Final  Blood culture (routine x 2)     Status: None   Collection Time: 05/24/24  6:49 AM   Specimen: Right Antecubital; Blood  Result Value Ref Range Status   Specimen Description RIGHT ANTECUBITAL  Final   Special Requests   Final     BOTTLES DRAWN AEROBIC AND ANAEROBIC Blood Culture adequate volume   Culture   Final    NO GROWTH 5 DAYS Performed at [facility], [address] [place], [place] [address]    Report Status 05/29/2024 FINAL  Final  Resp panel by RT-PCR (RSV, Flu A&B, Covid) Anterior Nasal Swab     Status: None   Collection Time: 05/24/24  8:19 AM   Specimen: Anterior Nasal Swab  Result Value Ref Range Status   SARS Coronavirus 2 by RT PCR NEGATIVE NEGATIVE Final    Comment: (NOTE) SARS-CoV-2 target nucleic acids are NOT DETECTED.  The SARS-CoV-2 RNA is generally detectable in upper respiratory specimens during the acute phase of infection. The lowest concentration of SARS-CoV-2 viral copies this assay can detect is 138 copies/mL. A negative result does not preclude SARS-Cov-2 infection and should not be used as the sole basis for treatment or other patient management decisions. A negative result may occur with  improper specimen collection/handling, submission of specimen other than nasopharyngeal swab, presence of viral mutation(s) within the areas targeted by this assay, and inadequate number of viral copies(<138 copies/mL). A negative result must be combined with clinical observations, patient history, and epidemiological information. The expected result is Negative.  Fact Sheet for Patients:  bloggercourse.com  Fact Sheet for Healthcare Providers:  seriousbroker.it  This test is no t yet approved or cleared by the [place]  FDA and  has been authorized for detection and/or diagnosis of SARS-CoV-2 by FDA under an Emergency Use Authorization (EUA). This EUA will remain  in effect (meaning this test can be used) for the duration of the COVID-19 declaration under Section 564(b)(1) of the Act, 21 U.S.C.section 360bbb-3(b)(1), unless the authorization is terminated  or revoked sooner.       Influenza A by PCR NEGATIVE NEGATIVE  Final   Influenza B by PCR NEGATIVE NEGATIVE Final    Comment: (NOTE) The Xpert Xpress SARS-CoV-2/FLU/RSV plus assay is intended as an aid in the diagnosis of influenza from Nasopharyngeal swab specimens and should not be used as a sole basis for treatment. Nasal washings and aspirates are unacceptable for Xpert Xpress SARS-CoV-2/FLU/RSV testing.  Fact Sheet for Patients: bloggercourse.com  Fact Sheet for Healthcare Providers: seriousbroker.it  This test is not yet approved or cleared by the [place]  FDA and has been authorized for detection and/or diagnosis of SARS-CoV-2 by FDA under an Emergency Use Authorization (EUA). This EUA will remain in effect (meaning this test can be used) for the duration of the COVID-19 declaration under Section 564(b)(1) of the Act, 21 U.S.C. section 360bbb-3(b)(1), unless the authorization is terminated or revoked.     Resp Syncytial Virus by PCR NEGATIVE NEGATIVE Final    Comment: (NOTE) Fact Sheet for Patients: bloggercourse.com  Fact Sheet for Healthcare Providers: seriousbroker.it  This test is not yet approved or cleared by the [place]  FDA and has been authorized for detection and/or diagnosis of SARS-CoV-2 by FDA under an Emergency Use Authorization (EUA). This EUA will remain in effect (meaning this test can be used) for the duration of the COVID-19 declaration under Section 564(b)(1) of the Act, 21 U.S.C. section 360bbb-3(b)(1), unless the authorization is terminated or revoked.  Performed at [facility], [address] [place], [place] [address]   MRSA Next Gen by PCR, Nasal     Status: None   Collection Time: 05/24/24  5:49 PM   Specimen: Nasal Mucosa; Nasal Swab  Result Value Ref Range Status   MRSA by PCR Next Gen NOT DETECTED NOT DETECTED Final    Comment: (NOTE) The GeneXpert MRSA Assay (FDA approved for  NASAL specimens only), is one component of a comprehensive MRSA colonization surveillance program. It is not intended to diagnose MRSA infection nor to guide or monitor treatment for MRSA infections. Test performance is not FDA approved in patients less than 36 years old. Performed at [facility], [address] [place], [place] [address]      Labs: BNP (last 3 results) Recent Labs    07/08/23 1101 12/13/23 1344 04/06/24 2014  BNP 1,191.0* 780.7* 215.8*   Basic Metabolic Panel: No results for input(s): NA, K, CL, CO2, GLUCOSE, BUN, CREATININE, CALCIUM, MG, PHOS in the last 168 hours. Liver Function Tests: No results for input(s): AST, ALT, ALKPHOS, BILITOT, PROT, ALBUMIN in the last 168 hours. No results for input(s): LIPASE, AMYLASE in the last 168 hours. No results for input(s): AMMONIA in the last 168 hours. CBC: No results for input(s): WBC, NEUTROABS, HGB, HCT, MCV, PLT in the last 168 hours. Cardiac Enzymes: No results for input(s): CKTOTAL, CKMB, CKMBINDEX, TROPONINI in the last 168 hours. BNP: Invalid input(s): POCBNP CBG: No results for input(s): GLUCAP in the last 168 hours. D-Dimer No results for input(s): DDIMER in the last 72 hours. Hgb A1c No results for input(s): HGBA1C in the last 72 hours. Lipid Profile No results for input(s): CHOL, HDL, LDLCALC, TRIG, CHOLHDL, LDLDIRECT in the last 72 hours. Thyroid function studies No results for input(s): TSH, T4TOTAL, T3FREE, THYROIDAB in the last 72 hours.  Invalid input(s): FREET3 Anemia work up No results for input(s): VITAMINB12, FOLATE, FERRITIN, TIBC, IRON, RETICCTPCT in the last 72 hours. Urinalysis    Component Value Date/Time   COLORURINE YELLOW (A) 05/24/2024 0848   APPEARANCEUR CLEAR (A) 05/24/2024 0848   LABSPEC 1.011 05/24/2024 0848   PHURINE 6.0 05/24/2024 0848   GLUCOSEU NEGATIVE  05/24/2024 0848   HGBUR SMALL (A) 05/24/2024 0848   BILIRUBINUR NEGATIVE 05/24/2024 0848   KETONESUR NEGATIVE 05/24/2024 0848   PROTEINUR NEGATIVE 05/24/2024 0848   NITRITE NEGATIVE 05/24/2024 0848   LEUKOCYTESUR NEGATIVE 05/24/2024 0848   Sepsis Labs No results for input(s): WBC in the  last 168 hours.  Invalid input(s): PROCALCITONIN, LACTICIDVEN Microbiology Recent Results (from the past 240 hours)  Blood culture (routine x 2)     Status: None   Collection Time: 05/24/24  6:49 AM   Specimen: BLOOD LEFT FOREARM  Result Value Ref Range Status   Specimen Description BLOOD LEFT FOREARM  Final   Special Requests   Final    BOTTLES DRAWN AEROBIC AND ANAEROBIC Blood Culture adequate volume   Culture   Final    NO GROWTH 5 DAYS Performed at [facility], [address], [place], [place] [address]    Report Status 05/29/2024 FINAL  Final  Blood culture (routine x 2)     Status: None   Collection Time: 05/24/24  6:49 AM   Specimen: Right Antecubital; Blood  Result Value Ref Range Status   Specimen Description RIGHT ANTECUBITAL  Final   Special Requests   Final    BOTTLES DRAWN AEROBIC AND ANAEROBIC Blood Culture adequate volume   Culture   Final    NO GROWTH 5 DAYS Performed at [facility], [address] [place], [place] [address]    Report Status 05/29/2024 FINAL  Final  Resp panel by RT-PCR (RSV, Flu A&B, Covid) Anterior Nasal Swab     Status: None   Collection Time: 05/24/24  8:19 AM   Specimen: Anterior Nasal Swab  Result Value Ref Range Status   SARS Coronavirus 2 by RT PCR NEGATIVE NEGATIVE Final    Comment: (NOTE) SARS-CoV-2 target nucleic acids are NOT DETECTED.  The SARS-CoV-2 RNA is generally detectable in upper respiratory specimens during the acute phase of infection. The lowest concentration of SARS-CoV-2 viral copies this assay can detect is 138 copies/mL. A negative result does not preclude SARS-Cov-2 infection and should not be  used as the sole basis for treatment or other patient management decisions. A negative result may occur with  improper specimen collection/handling, submission of specimen other than nasopharyngeal swab, presence of viral mutation(s) within the areas targeted by this assay, and inadequate number of viral copies(<138 copies/mL). A negative result must be combined with clinical observations, patient history, and epidemiological information. The expected result is Negative.  Fact Sheet for Patients:  bloggercourse.com  Fact Sheet for Healthcare Providers:  seriousbroker.it  This test is no t yet approved or cleared by the [place]  FDA and  has been authorized for detection and/or diagnosis of SARS-CoV-2 by FDA under an Emergency Use Authorization (EUA). This EUA will remain  in effect (meaning this test can be used) for the duration of the COVID-19 declaration under Section 564(b)(1) of the Act, 21 U.S.C.section 360bbb-3(b)(1), unless the authorization is terminated  or revoked sooner.       Influenza A by PCR NEGATIVE NEGATIVE Final   Influenza B by PCR NEGATIVE NEGATIVE Final    Comment: (NOTE) The Xpert Xpress SARS-CoV-2/FLU/RSV plus assay is intended as an aid in the diagnosis of influenza from Nasopharyngeal swab specimens and should not be used as a sole basis for treatment. Nasal washings and aspirates are unacceptable for Xpert Xpress SARS-CoV-2/FLU/RSV testing.  Fact Sheet for Patients: bloggercourse.com  Fact Sheet for Healthcare Providers: seriousbroker.it  This test is not yet approved or cleared by the [place]  FDA and has been authorized for detection and/or diagnosis of SARS-CoV-2 by FDA under an Emergency Use Authorization (EUA). This EUA will remain in effect (meaning this test can be used) for the duration of the COVID-19 declaration under Section  564(b)(1) of the  Act, 21 U.S.C. section 360bbb-3(b)(1), unless the authorization is terminated or revoked.     Resp Syncytial Virus by PCR NEGATIVE NEGATIVE Final    Comment: (NOTE) Fact Sheet for Patients: bloggercourse.com  Fact Sheet for Healthcare Providers: seriousbroker.it  This test is not yet approved or cleared by the [place]  FDA and has been authorized for detection and/or diagnosis of SARS-CoV-2 by FDA under an Emergency Use Authorization (EUA). This EUA will remain in effect (meaning this test can be used) for the duration of the COVID-19 declaration under Section 564(b)(1) of the Act, 21 U.S.C. section 360bbb-3(b)(1), unless the authorization is terminated or revoked.  Performed at [facility], [address] [place], [place] [address]   MRSA Next Gen by PCR, Nasal     Status: None   Collection Time: 05/24/24  5:49 PM   Specimen: Nasal Mucosa; Nasal Swab  Result Value Ref Range Status   MRSA by PCR Next Gen NOT DETECTED NOT DETECTED Final    Comment: (NOTE) The GeneXpert MRSA Assay (FDA approved for NASAL specimens only), is one component of a comprehensive MRSA colonization surveillance program. It is not intended to diagnose MRSA infection nor to guide or monitor treatment for MRSA infections. Test performance is not FDA approved in patients less than 52 years old. Performed at [facility], [address] [place], [place] [address]    Imaging CT HEAD WO CONTRAST ( ) Result Date: 05/25/2024 EXAM: CT HEAD WITHOUT CONTRAST 05/25/2024 10:26:56 AM TECHNIQUE: CT of the head was performed without the administration of intravenous contrast. Automated exposure control, iterative reconstruction, and/or weight based adjustment of the mA/kV was utilized to reduce the radiation dose to as low as reasonably achievable. COMPARISON: Brain MRI 04/07/2024. Head CT 05/24/2024. CLINICAL HISTORY: ICH,  decreased level of consciousness. FINDINGS: BRAIN AND VENTRICLES: Trace hemorrhage along the inferior margin of the right falx is subtle on series 4 image 30. This appears virtually resolved compared to prior studies. No new or progressed intracranial hemorrhage. No mass effect or midline shift. Gray white differentiation remains normal for age. No evidence of acute infarct. No hydrocephalus. No extra-axial collection. ORBITS: No acute abnormality. SINUSES: No acute abnormality. SOFT TISSUES AND SKULL: No acute soft tissue abnormality. No skull fracture. IMPRESSION: 1. Virtually resolved trace right falcine hemorrhage. 2. No new intracranial abnormality. Electronically signed by: Helayne Hurst MD 05/25/2024 10:32 AM EDT RP Workstation: [id number]   CT Head Wo Contrast Result Date: 05/24/2024 CLINICAL DATA:  Follow-up acute subdural hemorrhage. EXAM: CT HEAD WITHOUT CONTRAST TECHNIQUE: Contiguous axial images were obtained from the base of the skull through the vertex without intravenous contrast. RADIATION DOSE REDUCTION: This exam was performed according to the departmental dose-optimization program which includes automated exposure control, adjustment of the mA and/or kV according to patient size and/or use of iterative reconstruction technique. COMPARISON:  05/24/2024 at 7:44 a.m. FINDINGS: Brain: Ventricles and cisterns are normal. Previous seen trace acute small focus of subdural/subarachnoid hemorrhage over the anterior parafalcine region is slightly less dense and therefore somewhat less apparent. No new areas of hemorrhage. Remainder of the exam is unchanged. Vascular: No hyperdense vessel or unexpected calcification. Skull: Normal. Negative for fracture or focal lesion. Sinuses/Orbits: No acute finding. Other: None. IMPRESSION: Previously seen trace acute small focus of subdural/subarachnoid hemorrhage over the anterior parafalcine region is slightly less dense and therefore somewhat less apparent. No new  areas of hemorrhage. Electronically Signed   By: Toribio Agreste M.D.   On: 05/24/2024 15:08  CT Cervical Spine Wo Contrast Result Date: 05/24/2024 EXAM: CT CERVICAL SPINE WITHOUT CONTRAST 05/24/2024 07:57:12 AM TECHNIQUE: CT of the cervical spine was performed without the administration of intravenous contrast. Multiplanar reformatted images are provided for review. Automated exposure control, iterative reconstruction, and/or weight based adjustment of the mA/kV was utilized to reduce the radiation dose to as low as reasonably achievable. COMPARISON: Cervical spine CT 05/22/2024. CT head and face 05/24/2024 reported separately. CLINICAL HISTORY: 83 year old male with neck trauma after a fall, on Eliquis , recent ED visit 05/22/2024 for fall and weakness. FINDINGS: CERVICAL SPINE: BONES AND ALIGNMENT: No acute fracture or traumatic malalignment. 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 Developing cervical interbody ankylosis (appears solid at C6-C7). Chronic anterior C1-C2 degeneration including partially calcified ligamentous hypertrophy. SOFT TISSUES: No prevertebral soft tissue swelling. Introducing genius band partially visible left chest cardiac pacemaker type device. LUNGS: Asymmetric upper lung pulmonary ground glass opacity greater on the left is new. IMPRESSION: 1. No acute traumatic injury identified in the cervical spine. 2. Asymmetric upper lung ground-glass opacity is new, consider mild or developing pulmonary edema, viral or atypical respiratory infection. 3. Stable diffuse hyperostosis and cervical spine degeneration. Electronically signed by: Helayne Hurst MD 05/24/2024 08:15 AM EDT RP Workstation: [id number]   CT Maxillofacial Wo Contrast Result Date: 05/24/2024 EXAM: CT OF THE FACE WITHOUT CONTRAST 05/24/2024 07:57:12 AM TECHNIQUE: CT of the face was performed without the administration of intravenous contrast. Multiplanar  reformatted images are provided for review. Automated exposure control, iterative reconstruction, and/or weight based adjustment of the mA/kV was utilized to reduce the radiation dose to as low as reasonably achievable. COMPARISON: CT reported separately today. CLINICAL HISTORY: 83 year old male with blunt facial trauma, laceration to nasal bridge, and history of falls. Patient on Eliquis . FINDINGS: LIMITATIONS/ARTIFACTS: Mild motion artifact. FACIAL BONES: Minimally displaced left nasal bone fracture. Carious left mandible anterior molar, right mandible medial incisor. No mandibular dislocation. No suspicious bone lesion. ORBITS: Globes are intact. No acute traumatic injury. No inflammatory change. SINUSES AND MASTOIDS: Paranasal sinuses, middle ears and mastoids remain clear. SOFT TISSUES: Overlying bilateral nasal bridge soft tissue swelling and trace posttraumatic soft tissue gas on the left. Calcified cervical carotid atherosclerosis. Postinflammatory tonsillar pillar calcifications. IMPRESSION: 1. Minimally displaced left nasal bone fractures with overlying soft tissue injury . 2. No other acute traumatic injury identified in the Face. 3. Mandible dental caries. Electronically signed by: Helayne Hurst MD 05/24/2024 08:10 AM EDT RP Workstation: [id number]   CT HEAD WO CONTRAST ( ) Result Date: 05/24/2024 EXAM: CT HEAD WITHOUT CONTRAST 05/24/2024 07:57:12 AM TECHNIQUE: CT of the head was performed without the administration of intravenous contrast. Automated exposure control, iterative reconstruction, and/or weight based adjustment of the mA/kV was utilized to reduce the radiation dose to as low as reasonably achievable. COMPARISON: Face CT reported separately today. CT 05/22/2024. CLINICAL HISTORY: 83 year old male with head trauma after a fall, found on floor. Laceration to bridge of nose. On Eliquis . Recent fall 05/22/24. FINDINGS: BRAIN AND VENTRICLES: Trace new parafalcine subdural or subarachnoid blood  best seen on coronal images series 4 image 33 compared to coronal image 31 on the recent exam. No other acute intracranial hemorrhage identified. No evidence of acute infarct. No hydrocephalus. No extra-axial collection. No mass effect or midline shift. 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 ORBITS: No acute abnormality. SINUSES: Paranasal sinuses and mastoids remain well aerated. SOFT TISSUES AND SKULL: New nasal bone fractures and overlying  soft tissue swelling and soft tissue gas, reported on Face CT separately. Traumatic Brain Injury Risk Stratification ----- Low - mBIG 1 IMPRESSION: 1. Trace new parafalcine subdural or subarachnoid hemorrhage. No midline shift or complicating features. 2. Nasal injury, See Face CT. Electronically signed by: Helayne Hurst MD 05/24/2024 08:07 AM EDT RP Workstation: [id number]   DG Chest Portable 1 View Result Date: 05/24/2024 EXAM: 1 VIEW(S) XRAY OF THE CHEST 05/24/2024 07:03:00 AM COMPARISON: 04/06/2024 CLINICAL HISTORY: Hypothermia, eval for sepsis. Hypothermia, eval for sepsis. FINDINGS: LINES, TUBES AND DEVICES: Left chest wall pacer device is identified with 2 leads overlying the right ventricle. Unchanged in appearance from the previous exam. LUNGS AND PLEURA: Low lung volumes. Left basilar opacity. Small left pleural effusion. No pulmonary edema. No pneumothorax. HEART AND MEDIASTINUM: Cardiomegaly. Aortic atherosclerosis. BONES AND SOFT TISSUES: Bilateral shoulder degenerative change. IMPRESSION: 1. Left basilar opacity which is favored to represent a combination of pleural effusion and atelectasis. Underlying airspace disease not excluded. 2. Unchanged appearance of left chest wall dual lead pacer device Electronically signed by: Waddell Calk MD 05/24/2024 07:14 AM EDT RP Workstation: [id number]      Time coordinating discharge: over 30 minutes  SIGNED:  Jabin Tapp DO [facility]

## 2024-06-02 NOTE — TOC Progression Note (Addendum)
 Transition of Care [facility]) - Progression Note    Patient Details  Name: Bob Ortiz MRN: [id number] Date of Birth: 24-Sep-1940  Transition of Care [facility]) CM/SW Contact  Dalia GORMAN Fuse, RN Phone Number: 06/02/2024, 11:57 AM  Clinical Narrative:    TOC spoke with the patient's daughter [name]. Her father has 8-10 SNF days for STR available. She would like for him to uses his remaining SNF days. She accepted the bed offer at [facility].  TOC spoke with Barnie at [facility] to make her aware and selected the facility in the hub. Nitchia to start insurance authorization.  Ins auth pending Pending AuthID: [id number]   [facility] sent PASRR [id number] A  to Barnie Senters at [facility]. Barnie was unable to look up PASRR in [facility]. TOC also unable to confirm PASRR. TOC called to [employer] [facility] and there was a validation error that has been corrected. TOC notified Deborah at [facility]  Approved PlanAuthID: [id number] Dates: 10/31-11/4/25 Next Review Date: 06/06/24   TOC will continue to follow.                     Expected Discharge Plan and Services         Expected Discharge Date: 05/31/24                                     Social Drivers of Health (SDOH) Interventions SDOH Screenings   Food Insecurity: No Food Insecurity (04/07/2024)  Housing: Low Risk  (04/07/2024)  Transportation Needs: No Transportation Needs (05/25/2024)  Utilities: Not At Risk (05/25/2024)  Financial Resource Strain: Low Risk  (02/08/2024)   Received from [facility]  Physical Activity: Sufficiently Active (11/01/2023)   Received from [facility]  Social Connections: Moderately Isolated (04/07/2024)  Tobacco Use: Medium Risk (05/24/2024)    Readmission Risk Interventions     No data to display

## 2024-06-02 NOTE — Plan of Care (Signed)
  Problem: Education: Goal: Ability to describe self-care measures that may prevent or decrease complications (Diabetes Survival Skills Education) will improve Outcome: Progressing   Problem: Coping: Goal: Ability to adjust to condition or change in health will improve Outcome: Progressing   Problem: Fluid Volume: Goal: Ability to maintain a balanced intake and output will improve Outcome: Progressing   Problem: Health Behavior/Discharge Planning: Goal: Ability to identify and utilize available resources and services will improve Outcome: Progressing   Problem: Metabolic: Goal: Ability to maintain appropriate glucose levels will improve Outcome: Progressing   Problem: Skin Integrity: Goal: Risk for impaired skin integrity will decrease Outcome: Progressing   Problem: Tissue Perfusion: Goal: Adequacy of tissue perfusion will improve Outcome: Progressing   Problem: Education: Goal: Knowledge of General Education information will improve Description: Including pain rating scale, medication(s)/side effects and non-pharmacologic comfort measures Outcome: Progressing   Problem: Clinical Measurements: Goal: Ability to maintain clinical measurements within normal limits will improve Outcome: Progressing   Problem: Activity: Goal: Risk for activity intolerance will decrease Outcome: Progressing   Problem: Nutrition: Goal: Adequate nutrition will be maintained Outcome: Progressing   Problem: Coping: Goal: Level of anxiety will decrease Outcome: Progressing

## 2024-06-05 ENCOUNTER — Telehealth: Payer: Self-pay | Admitting: Neurosurgery

## 2024-06-05 DIAGNOSIS — I6200 Nontraumatic subdural hemorrhage, unspecified: Secondary | ICD-10-CM

## 2024-06-05 NOTE — Telephone Encounter (Signed)
 Claudene Penne ORN, MD  [facility] [place] or [place], 2 to 4 weeks with repeat head CT  Please order imaging, thanks!

## 2024-06-05 NOTE — Telephone Encounter (Signed)
 Order placed

## 2024-06-12 NOTE — Telephone Encounter (Signed)
 Spoke with patient's daughter and she advised the patient is still in rehab. I explained to her that most rehab facilities will transport patient's to and from appointments. She states that she will call centralized scheduling to get the CT scheduled and then contact our office to schedule a follow up.

## 2024-06-12 NOTE — Telephone Encounter (Signed)
 Bob Ortiz 06/08/2024 03:28 PM EST Spoke with Pt's daughter, provided [facility] # to sch ct. Will c/b after ct is scheduled. ------------------------------------ Bob Ortiz 06/06/2024 10:49 AM EST lvm

## 2024-08-03 DEATH — deceased
